# Patient Record
Sex: Male | Born: 1995 | Race: White | Hispanic: No | Marital: Single | Smoking: Never smoker
Health system: Southern US, Community
[De-identification: ages and names within clinical notes are randomized; demographics above are authoritative.]

## PROBLEM LIST (undated history)

## (undated) DIAGNOSIS — I10 Essential (primary) hypertension: Secondary | ICD-10-CM

## (undated) DIAGNOSIS — K219 Gastro-esophageal reflux disease without esophagitis: Secondary | ICD-10-CM

## (undated) DIAGNOSIS — J45909 Unspecified asthma, uncomplicated: Secondary | ICD-10-CM

## (undated) DIAGNOSIS — F329 Major depressive disorder, single episode, unspecified: Secondary | ICD-10-CM

## (undated) DIAGNOSIS — L709 Acne, unspecified: Secondary | ICD-10-CM

## (undated) DIAGNOSIS — R519 Headache, unspecified: Secondary | ICD-10-CM

## (undated) DIAGNOSIS — F419 Anxiety disorder, unspecified: Secondary | ICD-10-CM

## (undated) DIAGNOSIS — F32A Depression, unspecified: Secondary | ICD-10-CM

## (undated) DIAGNOSIS — R51 Headache: Secondary | ICD-10-CM

## (undated) DIAGNOSIS — K760 Fatty (change of) liver, not elsewhere classified: Secondary | ICD-10-CM

## (undated) HISTORY — DX: Headache, unspecified: R51.9

## (undated) HISTORY — DX: Headache: R51

## (undated) HISTORY — DX: Major depressive disorder, single episode, unspecified: F32.9

## (undated) HISTORY — DX: Gastro-esophageal reflux disease without esophagitis: K21.9

## (undated) HISTORY — DX: Acne, unspecified: L70.9

## (undated) HISTORY — PX: TONSILLECTOMY: SUR1361

## (undated) HISTORY — DX: Essential (primary) hypertension: I10

## (undated) HISTORY — DX: Anxiety disorder, unspecified: F41.9

## (undated) HISTORY — DX: Unspecified asthma, uncomplicated: J45.909

## (undated) HISTORY — PX: ADENOIDECTOMY: SUR15

## (undated) HISTORY — DX: Fatty (change of) liver, not elsewhere classified: K76.0

## (undated) HISTORY — DX: Depression, unspecified: F32.A

---
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
## 2015-01-02 ENCOUNTER — Ambulatory Visit: Payer: Self-pay | Admitting: Internal Medicine

## 2015-01-12 ENCOUNTER — Ambulatory Visit (INDEPENDENT_AMBULATORY_CARE_PROVIDER_SITE_OTHER): Payer: BLUE CROSS/BLUE SHIELD | Admitting: Internal Medicine

## 2015-01-12 ENCOUNTER — Ambulatory Visit: Payer: BLUE CROSS/BLUE SHIELD | Admitting: Cardiovascular Disease

## 2015-01-12 ENCOUNTER — Encounter: Payer: Self-pay | Admitting: Internal Medicine

## 2015-01-12 VITALS — BP 114/77 | HR 85 | Temp 98.0°F | Ht 73.0 in | Wt 383.2 lb

## 2015-01-12 DIAGNOSIS — R0789 Other chest pain: Secondary | ICD-10-CM | POA: Diagnosis not present

## 2015-01-12 DIAGNOSIS — F4323 Adjustment disorder with mixed anxiety and depressed mood: Secondary | ICD-10-CM | POA: Diagnosis not present

## 2015-01-12 LAB — COMPREHENSIVE METABOLIC PANEL
ALT: 53 U/L (ref 0–53)
AST: 31 U/L (ref 0–37)
Albumin: 4.3 g/dL (ref 3.5–5.2)
Alkaline Phosphatase: 61 U/L (ref 52–171)
BUN: 16 mg/dL (ref 6–23)
CO2: 25 mEq/L (ref 19–32)
CREATININE: 0.85 mg/dL (ref 0.40–1.50)
Calcium: 9.6 mg/dL (ref 8.4–10.5)
Chloride: 104 mEq/L (ref 96–112)
GFR: 123.75 mL/min (ref >60.00)
GLUCOSE: 90 mg/dL (ref 70–99)
POTASSIUM: 4.2 meq/L (ref 3.5–5.1)
SODIUM: 136 meq/L (ref 135–145)
Total Bilirubin: 0.5 mg/dL (ref 0.3–1.2)
Total Protein: 6.8 g/dL (ref 6.0–8.3)

## 2015-01-12 LAB — CBC
HEMATOCRIT: 46.4 % (ref 36.0–49.0)
HEMOGLOBIN: 16 g/dL (ref 12.0–16.0)
MCHC: 34.4 g/dL (ref 31.0–37.0)
MCV: 88.9 fl (ref 78.0–98.0)
PLATELETS: 264 10*3/uL (ref 150.0–575.0)
RBC: 5.22 Mil/uL (ref 3.80–5.70)
RDW: 13.7 % (ref 11.4–15.5)
WBC: 6.8 10*3/uL (ref 4.5–13.5)

## 2015-01-12 LAB — TROPONIN I: TNIDX: 0 ug/l (ref 0.00–0.06)

## 2015-01-12 LAB — TSH: TSH: 2.24 u[IU]/mL (ref 0.40–5.00)

## 2015-01-12 MED ORDER — BUPROPION HCL ER (XL) 150 MG PO TB24: 150.0000 mg | ORAL_TABLET | Freq: Every day | ORAL | Status: DC

## 2015-01-12 NOTE — Assessment & Plan Note (Signed)
Left sided chest pain in setting of anxiety. Suspicion for CAD is low, but he does have some risk factors including HTN, obesity. EKG today showed no acute changes. Will check troponin. Cardiology evaluation for possible stress test.

## 2015-01-12 NOTE — Progress Notes (Signed)
Subjective:    Patient ID: Larry Rogers, male    DOB: 1996/04/10, 19 y.o.   MRN: [id number]  HPI  19YO male presents for follow up.  Headaches - frontal over the last week. Thought due to sinus congestion.  Yesterday, driving and left hand started tingling, then he felt pressure in left chest. Notes some increased belching during that time. Episode lasted about 1 hour. Felt anxious during that time.  Sweaty at that time. Some nausea. BP was 171/100 when checked. HR 115. No recurrent symptoms. No recent stressors at home or work. Mother notes more anxiety lately. Quick to get angry.  Past medical, surgical, family and social history per today's encounter.  Review of Systems  Constitutional: Negative for fever, chills, activity change, appetite change, fatigue and unexpected weight change.  Eyes: Negative for visual disturbance.  Respiratory: Negative for cough and shortness of breath.   Cardiovascular: Positive for chest pain. Negative for palpitations and leg swelling.  Gastrointestinal: Negative for nausea, vomiting, abdominal pain, diarrhea, constipation and abdominal distention.  Genitourinary: Negative for dysuria, urgency and difficulty urinating.  Musculoskeletal: Negative for arthralgias and gait problem.  Skin: Negative for color change and rash.  Neurological: Positive for numbness and headaches. Negative for dizziness, tremors, seizures, speech difficulty, weakness and light-headedness.  Hematological: Negative for adenopathy.  Psychiatric/Behavioral: Negative for suicidal ideas, sleep disturbance and dysphoric mood. The patient is nervous/anxious.        Objective:    BP 114/77 mmHg  Pulse 85  Temp(Src) 98 F (36.7 C) (Oral)  Ht  (1.854 m)  Wt 383 lb 4 oz (173.841 kg)  BMI 50.57 kg/m2  SpO2 98% Physical Exam  Constitutional: He is oriented to person, place, and time. He appears well-developed and well-nourished. No distress.  HENT:  Head:  Normocephalic and atraumatic.  Right Ear: External ear normal.  Left Ear: External ear normal.  Nose: Nose normal.  Mouth/Throat: Oropharynx is clear and moist. No oropharyngeal exudate.  Eyes: Conjunctivae and EOM are normal. Pupils are equal, round, and reactive to light. Right eye exhibits no discharge. Left eye exhibits no discharge. No scleral icterus.  Neck: Normal range of motion. Neck supple. No tracheal deviation present. No thyromegaly present.  Cardiovascular: Normal rate, regular rhythm and normal heart sounds.  Exam reveals no gallop and no friction rub.   No murmur heard. Pulmonary/Chest: Effort normal and breath sounds normal. No accessory muscle usage. No tachypnea. No respiratory distress. He has no decreased breath sounds. He has no wheezes. He has no rhonchi. He has no rales. He exhibits no tenderness.  Musculoskeletal: Normal range of motion. He exhibits no edema.  Lymphadenopathy:    He has no cervical adenopathy.  Neurological: He is alert and oriented to person, place, and time. No cranial nerve deficit. Coordination normal.  Skin: Skin is warm and dry. No rash noted. He is not diaphoretic. No erythema. No pallor.  Psychiatric: He has a normal mood and affect. His behavior is normal. Judgment and thought content normal.          Assessment & Plan:   Problem List Items Addressed This Visit      Unprioritized   Adjustment disorder with mixed anxiety and depressed mood    Worsening anxiety. Will add Wellbutrin. Will set up counseling with Dr. Laymond Purser. Follow up in 4 weeks and prn.      Relevant Medications   buPROPion (WELLBUTRIN XL) 150 MG 24 hr tablet   Other Relevant Orders  Ambulatory referral to Psychology   Chest discomfort - Primary    Left sided chest pain in setting of anxiety. Suspicion for CAD is low, but he does have some risk factors including HTN, obesity. EKG today showed no acute changes. Will check troponin. Cardiology evaluation for possible  stress test.      Relevant Orders   EKG 12-Lead (Completed)   Ambulatory referral to Cardiology   Comprehensive metabolic panel   TSH   CBC   Troponin I       Return in about 2 weeks (around 01/26/2015) for Recheck.

## 2015-01-12 NOTE — Progress Notes (Signed)
Pre visit review using our clinic review tool, if applicable. No additional management support is needed unless otherwise documented below in the visit note. 

## 2015-01-12 NOTE — Assessment & Plan Note (Signed)
Worsening anxiety. Will add Wellbutrin. Will set up counseling with Dr. Laymond PurserPerrin. Follow up in 4 weeks and prn.

## 2015-01-12 NOTE — Patient Instructions (Signed)
Start Wellbutrin 150mg  daily.  We will set up cardiology evaluation and counseling.

## 2015-01-14 ENCOUNTER — Emergency Department
Admit: 2015-01-14 | Disposition: A | Discharge status: Home / Self Care | Payer: Self-pay | Admitting: Emergency Medicine

## 2015-01-14 ENCOUNTER — Other Ambulatory Visit: Payer: Self-pay

## 2015-01-14 LAB — BASIC METABOLIC PANEL
Anion Gap: 7 (ref 7–16)
BUN: 15 mg/dL
CHLORIDE: 107 mmol/L
Calcium, Total: 9 mg/dL
Co2: 24 mmol/L
Creatinine: 0.96 mg/dL
EGFR (African American): >60
EGFR (Non-African Amer.): >60
Glucose: 92 mg/dL
Potassium: 4.2 mmol/L
Sodium: 138 mmol/L

## 2015-01-14 LAB — CBC
HCT: 47.2 % (ref 40.0–52.0)
HGB: 16.4 g/dL (ref 13.0–18.0)
MCH: 30.9 pg (ref 26.0–34.0)
MCHC: 34.8 g/dL (ref 32.0–36.0)
MCV: 89 fL (ref 80–100)
PLATELETS: 271 10*3/uL (ref 150–440)
RBC: 5.3 10*6/uL (ref 4.40–5.90)
RDW: 13.7 % (ref 11.5–14.5)
WBC: 8.6 10*3/uL (ref 3.8–10.6)

## 2015-01-14 LAB — TROPONIN I

## 2015-01-17 ENCOUNTER — Ambulatory Visit (INDEPENDENT_AMBULATORY_CARE_PROVIDER_SITE_OTHER): Payer: BLUE CROSS/BLUE SHIELD | Admitting: Nurse Practitioner

## 2015-01-17 ENCOUNTER — Encounter: Payer: Self-pay | Admitting: Nurse Practitioner

## 2015-01-17 VITALS — BP 128/60 | HR 94 | Temp 98.1°F | Resp 12 | Ht 73.0 in | Wt 375.0 lb

## 2015-01-17 DIAGNOSIS — H539 Unspecified visual disturbance: Secondary | ICD-10-CM | POA: Diagnosis not present

## 2015-01-17 DIAGNOSIS — R519 Headache, unspecified: Secondary | ICD-10-CM | POA: Insufficient documentation

## 2015-01-17 DIAGNOSIS — G44229 Chronic tension-type headache, not intractable: Secondary | ICD-10-CM

## 2015-01-17 DIAGNOSIS — R1314 Dysphagia, pharyngoesophageal phase: Secondary | ICD-10-CM

## 2015-01-17 DIAGNOSIS — K219 Gastro-esophageal reflux disease without esophagitis: Secondary | ICD-10-CM | POA: Diagnosis not present

## 2015-01-17 DIAGNOSIS — R51 Headache: Secondary | ICD-10-CM

## 2015-01-17 DIAGNOSIS — G4700 Insomnia, unspecified: Secondary | ICD-10-CM | POA: Diagnosis not present

## 2015-01-17 MED ORDER — ONDANSETRON HCL 4 MG PO TABS: 4.0000 mg | ORAL_TABLET | Freq: Three times a day (TID) | ORAL | Status: DC | PRN

## 2015-01-17 NOTE — Assessment & Plan Note (Signed)
Asked pt to try trazodone once again. Will follow.

## 2015-01-17 NOTE — Progress Notes (Signed)
Subjective:    Patient ID: Larry Rogers, male    DOB: Mar 07, 1996, 19 y.o.   MRN: [id number]  HPI  [name] is a 19 yo male with a CC of headache x 3 weeks.   1) Headaches upon waking x 3 feels better when lying down  Denies head trauma, no sports, MVA, pressure when bending forward  Constant throughout the day, starts in front and goes to the back, described as nagging, hard time focusing, feels vision has changed, (Last eye exam was last year and was normal) pulsating occasionally, no headaches like this before Tylenol sinus, allergy medication, tylenol PM, "tension medicine"- nothing helpful    2) Anxiety-He is fatigued recently, not sleeping, not using the trazodone Maximum anxiety recently, unsure of trigger or what has changed, denies school/work/home stressors. Seeing Larry Rogers soon  3) Nausea x 1.5 week  Vomited last night- felt nauseated after dinner, only happened once, no unusual foods, felt nauseated before meal  Diarrhea this morning- loose stools, woke him up this morning   No more episodes this morning  Pressure, gas, trouble swallowing, seeing Larry Rogers tomorrow   2 months this has been going on  Saw Larry Rogers last week, EKG set up with cardio  4/30 chest symptoms again [facility]   Review of Systems  Constitutional: Positive for fatigue. Negative for fever, chills and diaphoresis.  HENT: Positive for trouble swallowing. Negative for tinnitus.   Eyes: Negative for visual disturbance.       No blurry or double vision  Respiratory: Positive for chest tightness and shortness of breath. Negative for wheezing.   Cardiovascular: Positive for palpitations. Negative for chest pain and leg swelling.  Gastrointestinal: Positive for nausea and vomiting. Negative for abdominal pain and diarrhea.       Loose stools, increased gas  Genitourinary: Negative for difficulty urinating.  Musculoskeletal: Negative for back pain and neck pain.  Skin: Negative  for rash.  Neurological: Positive for light-headedness and headaches. Negative for syncope and weakness.  Hematological: Does not bruise/bleed easily.  Psychiatric/Behavioral: Positive for sleep disturbance and decreased concentration. Negative for suicidal ideas, confusion and agitation. The patient is nervous/anxious. The patient is not hyperactive.    Past Medical History  Diagnosis Date  . Acne     [facility]  . Hypertension 2012    Dr. Kathrene Rogers  . Asthma     seasonal allergies, Dx age 89    History   Social History  . Marital Status: Single    Spouse Name: N/A  . Number of Children: N/A  . Years of Education: N/A   Occupational History  . Not on file.   Social History Main Topics  . Smoking status: Never Smoker   . Smokeless tobacco: Never Used  . Alcohol Use: No  . Drug Use: No  . Sexual Activity: Not on file   Other Topics Concern  . Not on file   Social History Narrative   [place] Pets - dog and cat.      School - [employer], rising senior. Plans to major in accountant.      Diet - regular, tries to limit processed sugars      Exercise - started basketball, swimming    Past Surgical History  Procedure Laterality Date  . Tonsillectomy      Family History  Problem Relation Age of Onset  . Hypertension Father   . Heart disease Paternal Grandfather   .  Hypertension Paternal Grandfather     No Known Allergies  Current Outpatient Prescriptions on File Prior to Visit  Medication Sig Dispense Refill  . buPROPion (WELLBUTRIN XL) 150 MG 24 hr tablet Take 1 tablet (150 mg total) by mouth daily. 30 tablet 3  . Cetirizine HCl (ZYRTEC ALLERGY) 10 MG CAPS Take 1 capsule (10 mg total) by mouth daily. 90 capsule 4  . lisinopril (PRINIVIL,ZESTRIL) 10 MG tablet Take 1 tablet (10 mg total) by mouth daily. 90 tablet 4  . montelukast (SINGULAIR) 10 MG tablet Take 1 tablet (10 mg total) by mouth at bedtime. 90 tablet 4  . pantoprazole  (PROTONIX) 40 MG tablet Take 1 tablet (40 mg total) by mouth 2 (two) times daily. 90 tablet 3  . traZODone (DESYREL) 50 MG tablet Take 1 tablet (50 mg total) by mouth at bedtime as needed for sleep. 30 tablet 3   No current facility-administered medications on file prior to visit.      Objective:   Physical Exam  Constitutional: He is oriented to person, place, and time. He appears well-developed and well-nourished. No distress.  BP 128/60 mmHg  Pulse 94  Temp(Src) 98.1 F (36.7 C) (Oral)  Resp 12  Ht  (1.854 m)  Wt 375 lb (170.099 kg)  BMI 49.49 kg/m2  SpO2 97%   HENT:  Head: Normocephalic and atraumatic.  Right Ear: External ear normal.  Left Ear: External ear normal.  Eyes: EOM are normal. Right eye exhibits no discharge. Left eye exhibits no discharge. No scleral icterus.  Cardiovascular: Normal rate, regular rhythm and normal heart sounds.  Exam reveals no gallop and no friction rub.   No murmur heard. Not orthostatic today Nurse put in extended vitals  Pulmonary/Chest: Effort normal and breath sounds normal. No respiratory distress. He has no wheezes. He has no rales. He exhibits no tenderness.  Neurological: He is alert and oriented to person, place, and time. He has normal reflexes. He displays normal reflexes. No cranial nerve deficit. He exhibits normal muscle tone. Coordination normal.  Cranial nerves 2,3,4,5,7, 11, and 12 intact. No accomodation reflex noted eyes move down and to the left together. DTR's hard to assess because pt was hyper-aware and would not relax. 5/5 tone of upper and lower extremities, sensation equal in upper and lower extremities and in different dermatomes. Romberg negative  Skin: Skin is warm and dry. No rash noted. He is not diaphoretic.  Psychiatric: He has a normal mood and affect. His behavior is normal. Judgment and thought content normal.  Vitals reviewed.     Assessment & Plan:

## 2015-01-17 NOTE — Assessment & Plan Note (Signed)
No accommodation reflex, pt moves eyes down and lateral to the left and eyes track together. Unsure if this has been a problem in the past due to meeting the patient for the first time today and he has no memory of someone commenting on this or diagnosing him. Will refer to Dr. Everlena CooperJaffe. FU prn worsening/failure to improve.

## 2015-01-17 NOTE — Assessment & Plan Note (Signed)
Pt seeing Dr. Arlyce DiceKaplan tomorrow

## 2015-01-17 NOTE — Assessment & Plan Note (Signed)
Recent headache now lasting 3 weeks. Probably due to stress/tension, could be sinus related because of worsening upon bending forward and sitting up from lying. Asked pt to try excedrin migraine when first getting up and stop if not helpful or jittery. Watch NSAIDs due to esophogeal problems, but okay to try once.

## 2015-01-17 NOTE — Progress Notes (Signed)
Pre visit review using our clinic review tool, if applicable. No additional management support is needed unless otherwise documented below in the visit note. 

## 2015-01-17 NOTE — Assessment & Plan Note (Signed)
Has appointment tomorrow.

## 2015-01-17 NOTE — Patient Instructions (Signed)
We will contact you about your referral to neurology.

## 2015-01-18 ENCOUNTER — Ambulatory Visit (INDEPENDENT_AMBULATORY_CARE_PROVIDER_SITE_OTHER): Payer: BLUE CROSS/BLUE SHIELD | Admitting: Gastroenterology

## 2015-01-18 ENCOUNTER — Encounter: Payer: Self-pay | Admitting: Gastroenterology

## 2015-01-18 VITALS — BP 112/60 | HR 76 | Ht 73.0 in | Wt 374.1 lb

## 2015-01-18 DIAGNOSIS — R1314 Dysphagia, pharyngoesophageal phase: Secondary | ICD-10-CM

## 2015-01-18 DIAGNOSIS — E669 Obesity, unspecified: Secondary | ICD-10-CM | POA: Diagnosis not present

## 2015-01-18 DIAGNOSIS — K219 Gastro-esophageal reflux disease without esophagitis: Secondary | ICD-10-CM | POA: Diagnosis not present

## 2015-01-18 MED ORDER — DEXLANSOPRAZOLE 60 MG PO CPDR: 60.0000 mg | DELAYED_RELEASE_CAPSULE | Freq: Every day | ORAL | Status: DC

## 2015-01-18 NOTE — Progress Notes (Signed)
_                                                                                                                History of Present Illness:  [name] is a 19 year old white male referred at the request of [name] for evaluation of reflux.  Over the past 2 months he's complaining of severe pyrosis despite taking pantoprazole twice a day.  This may occur anytime throughout the day or night.  It is often postprandial.  He has excess belching and gas.  He denies abdominal pain.  When he tries to belch he may regurgitate food at which point he develops chest discomfort.  He has mild dysphagia to solids.   Past Medical History  Diagnosis Date  . Acne     [facility]  . Hypertension 2012    Dr. Kathrene AluArchinald  . Asthma     seasonal allergies, Dx age 89  . Anxiety   . Depression    Past Surgical History  Procedure Laterality Date  . Tonsillectomy     family history includes Aortic aneurysm in his paternal grandfather; Diabetes in his maternal uncle and mother; Heart disease in his paternal grandfather; Hypertension in his father and paternal grandfather. There is no history of Colon cancer, Colon polyps, Esophageal cancer, Kidney disease, or Gallbladder disease. Current Outpatient Prescriptions  Medication Sig Dispense Refill  . buPROPion (WELLBUTRIN XL) 150 MG 24 hr tablet Take 1 tablet (150 mg total) by mouth daily. 30 tablet 3  . Cetirizine HCl (ZYRTEC ALLERGY) 10 MG CAPS Take 1 capsule (10 mg total) by mouth daily. 90 capsule 4  . lisinopril (PRINIVIL,ZESTRIL) 10 MG tablet Take 1 tablet (10 mg total) by mouth daily. 90 tablet 4  . montelukast (SINGULAIR) 10 MG tablet Take 1 tablet (10 mg total) by mouth at bedtime. 90 tablet 4  . ondansetron (ZOFRAN) 4 MG tablet Take 1 tablet (4 mg total) by mouth every 8 (eight) hours as needed for nausea or vomiting. 20 tablet 0  . pantoprazole (PROTONIX) 40 MG tablet Take 1 tablet (40 mg total) by mouth 2 (two) times daily. 90 tablet 3   . traZODone (DESYREL) 50 MG tablet Take 1 tablet (50 mg total) by mouth at bedtime as needed for sleep. 30 tablet 3   No current facility-administered medications for this visit.   Allergies as of 01/18/2015  . (No Known Allergies)    reports that he has never smoked. He has never used smokeless tobacco. He reports that he does not drink alcohol or use illicit drugs.   Review of Systems: Pertinent positive and negative review of systems were noted in the above HPI section. All other review of systems were otherwise negative.  Vital signs were reviewed in today's medical record Physical Exam: General: Obese male in no acute distress Skin: anicteric Head: Normocephalic and atraumatic Eyes:  sclerae anicteric, EOMI Ears: Normal auditory acuity Mouth: No deformity or lesions Neck: Supple, no masses or thyromegaly Lymph Nodes: no lymphadenopathy Lungs:  Clear throughout to auscultation Heart: Regular rate and rhythm; no murmurs, rubs or bruits Gastroinestinal: Soft, non tender and non distended. No masses, hepatosplenomegaly or hernias noted. Normal Bowel sounds.  There is no succussion splash Rectal:deferred Musculoskeletal: Symmetrical with no gross deformities  Skin: No lesions on visible extremities Pulses:  Normal pulses noted Extremities: No clubbing, cyanosis, edema or deformities noted Neurological: Alert oriented x 4, grossly nonfocal Cervical Nodes:  No significant cervical adenopathy Inguinal Nodes: No significant inguinal adenopathy Psychological:  Alert and cooperative. Normal mood and affect  See Assessment and Plan under Problem List

## 2015-01-18 NOTE — Assessment & Plan Note (Signed)
Rule out early esophageal stricture and eosinophilic esophagitis.    Recommendations #1 upper endoscopy

## 2015-01-18 NOTE — Addendum Note (Signed)
Addended by: Marlowe KaysSTALLINGS,  M on: 01/18/2015 11:54 AM   Modules accepted: Orders

## 2015-01-18 NOTE — Patient Instructions (Signed)
Gastroesophageal Reflux Disease, Adult Gastroesophageal reflux disease (GERD) happens when acid from your stomach flows up into the esophagus. When acid comes in contact with the esophagus, the acid causes soreness (inflammation) in the esophagus. Over time, GERD may create small holes (ulcers) in the lining of the esophagus. CAUSES   Increased body weight. This puts pressure on the stomach, making acid rise from the stomach into the esophagus.  Smoking. This increases acid production in the stomach.  Drinking alcohol. This causes decreased pressure in the lower esophageal sphincter (valve or ring of muscle between the esophagus and stomach), allowing acid from the stomach into the esophagus.  Late evening meals and a full stomach. This increases pressure and acid production in the stomach.  A malformed lower esophageal sphincter. Sometimes, no cause is found. SYMPTOMS   Burning pain in the lower part of the mid-chest behind the breastbone and in the mid-stomach area. This may occur twice a week or more often.  Trouble swallowing.  Sore throat.  Dry cough.  Asthma-like symptoms including chest tightness, shortness of breath, or wheezing. DIAGNOSIS  Your caregiver may be able to diagnose GERD based on your symptoms. In some cases, X-rays and other tests may be done to check for complications or to check the condition of your stomach and esophagus. TREATMENT  Your caregiver may recommend over-the-counter or prescription medicines to help decrease acid production. Ask your caregiver before starting or adding any new medicines.  HOME CARE INSTRUCTIONS   Change the factors that you can control. Ask your caregiver for guidance concerning weight loss, quitting smoking, and alcohol consumption.  Avoid foods and drinks that make your symptoms worse, such as:  Caffeine or alcoholic drinks.  Chocolate.  Peppermint or mint flavorings.  Garlic and onions.  Spicy foods.  Citrus fruits,  such as oranges, lemons, or limes.  Tomato-based foods such as sauce, chili, salsa, and pizza.  Fried and fatty foods.  Avoid lying down for the 3 hours prior to your bedtime or prior to taking a nap.  Eat small, frequent meals instead of large meals.  Wear loose-fitting clothing. Do not wear anything tight around your waist that causes pressure on your stomach.  Raise the head of your bed 6 to 8 inches with wood blocks to help you sleep. Extra pillows will not help.  Only take over-the-counter or prescription medicines for pain, discomfort, or fever as directed by your caregiver.  Do not take aspirin, ibuprofen, or other nonsteroidal anti-inflammatory drugs (NSAIDs). SEEK IMMEDIATE MEDICAL CARE IF:   You have pain in your arms, neck, jaw, teeth, or back.  Your pain increases or changes in intensity or duration.  You develop nausea, vomiting, or sweating (diaphoresis).  You develop shortness of breath, or you faint.  Your vomit is green, yellow, black, or looks like coffee grounds or blood.  Your stool is red, bloody, or black. These symptoms could be signs of other problems, such as heart disease, gastric bleeding, or esophageal bleeding. MAKE SURE YOU:   Understand these instructions.  Will watch your condition.  Will get help right away if you are not doing well or get worse. Document Released: 06/12/2005 Document Revised: 11/25/2011 Document Reviewed: 03/22/2011 [facility] Patient Information 2015 [place], [place]. This information is not intended to replace advice given to you by your health care provider. Make sure you discuss any questions you have with your health care provider.   Food Choices for Gastroesophageal Reflux Disease When you have gastroesophageal reflux disease (GERD), the  foods you eat and your eating habits are very important. Choosing the right foods can help ease the discomfort of GERD. WHAT GENERAL GUIDELINES DO I NEED TO FOLLOW?  Choose fruits,  vegetables, whole grains, low-fat dairy products, and low-fat meat, fish, and poultry.  Limit fats such as oils, salad dressings, butter, nuts, and avocado.  Keep a food diary to identify foods that cause symptoms.  Avoid foods that cause reflux. These may be different for different people.  Eat frequent small meals instead of three large meals each day.  Eat your meals slowly, in a relaxed setting.  Limit fried foods.  Cook foods using methods other than frying.  Avoid drinking alcohol.  Avoid drinking large amounts of liquids with your meals.  Avoid bending over or lying down until 2-3 hours after eating. WHAT FOODS ARE NOT RECOMMENDED? The following are some foods and drinks that may worsen your symptoms: Vegetables Tomatoes. Tomato juice. Tomato and spaghetti sauce. Chili peppers. Onion and garlic. Horseradish. Fruits Oranges, grapefruit, and lemon (fruit and juice). Meats High-fat meats, fish, and poultry. This includes hot dogs, ribs, ham, sausage, salami, and bacon. Dairy Whole milk and chocolate milk. Sour cream. Cream. Butter. Ice cream. Cream cheese.  Beverages Coffee and tea, with or without caffeine. Carbonated beverages or energy drinks. Condiments Hot sauce. Barbecue sauce.  Sweets/Desserts Chocolate and cocoa. Donuts. Peppermint and spearmint. Fats and Oils High-fat foods, including [place] fries and potato chips. Other Vinegar. Strong spices, such as black pepper, white pepper, red pepper, cayenne, curry powder, cloves, ginger, and chili powder. The items listed above may not be a complete list of foods and beverages to avoid. Contact your dietitian for more information. Document Released: 09/02/2005 Document Revised: 09/07/2013 Document Reviewed: 07/07/2013 [facility] Patient Information 2015 [place]. This information is not intended to replace advice given to you by your health care provider. Make sure you discuss any questions you have with your  health care provider.    Your hospital procedure has been scheduled on 03/16/2015 at 10am Separate instructions have been given We are sending in Dexilant to your pharmacy

## 2015-01-18 NOTE — Assessment & Plan Note (Signed)
I had lengthy discussion with he and his father about his obesity including health issues related to obesity.  I also explained that is probably contributing to his reflux.  We discussed weight loss as a strategy and consideration of bariatric surgery.   25 minutes were spent with the patient and/or family.  Greater than 50% was spent in counseling and coordination of care with the patient

## 2015-01-18 NOTE — Assessment & Plan Note (Signed)
Patient is symptomatic despite twice a day Protonix.  Recommendations #1 antireflux measures #2 trial of dexilant 60 mg daily  Cc Dr. Dan HumphreysWalker

## 2015-01-19 ENCOUNTER — Telehealth: Payer: Self-pay | Admitting: Gastroenterology

## 2015-01-19 MED ORDER — ESOMEPRAZOLE MAGNESIUM 40 MG PO CPDR: 40.0000 mg | DELAYED_RELEASE_CAPSULE | Freq: Two times a day (BID) | ORAL | Status: DC

## 2015-01-19 NOTE — Telephone Encounter (Signed)
FYI Dr Arlyce DiceKaplan, I sent in Nexium 40 BID for patient. His insurance will not cover dexilant without trying 2 preferred meds.    So I sent in Nexium

## 2015-01-19 NOTE — Telephone Encounter (Signed)
ok 

## 2015-01-24 ENCOUNTER — Encounter: Payer: Self-pay | Admitting: Internal Medicine

## 2015-01-25 ENCOUNTER — Ambulatory Visit (INDEPENDENT_AMBULATORY_CARE_PROVIDER_SITE_OTHER): Payer: BLUE CROSS/BLUE SHIELD | Admitting: Family Medicine

## 2015-01-25 ENCOUNTER — Telehealth: Payer: Self-pay | Admitting: Gastroenterology

## 2015-01-25 ENCOUNTER — Encounter: Payer: Self-pay | Admitting: Family Medicine

## 2015-01-25 ENCOUNTER — Encounter: Payer: Self-pay | Admitting: Internal Medicine

## 2015-01-25 VITALS — BP 138/90 | HR 95 | Temp 98.8°F | Ht 73.0 in | Wt 369.8 lb

## 2015-01-25 DIAGNOSIS — R1013 Epigastric pain: Secondary | ICD-10-CM | POA: Diagnosis not present

## 2015-01-25 MED ORDER — SUCRALFATE 1 GM/10ML PO SUSP: 1.0000 g | Freq: Three times a day (TID) | ORAL | Status: DC

## 2015-01-25 NOTE — Progress Notes (Signed)
Subjective:    Patient ID: Larry Rogers, male    DOB: 06/26/1996, 19 y.o.   MRN: [id number]  HPI Here with GI complaints  Some pain on L side of abdomen next to umbilicus (a little above)  Describes as an ache  Eating does not affect it  Does use tums-no help with that   Never had an abd us   Has hx of GERD  Vomited yellow material this am  Does not feel like this was a virus  Some loose stool  No appetite  Lost 14 lb  Did not eat last night   No nsaids    Was recently changed from protonix to nexium bid (no difference)  Is set up for EGD June 30th  gerd makes his chest hurt and then anxiety kicks in     Chemistry      Component Value Date/Time   NA 138 01/14/2015 2047   NA 136 01/12/2015 0837   NA 139 06/21/2014 0940   K 4.2 01/14/2015 2047   K 4.2 01/12/2015 0837   CL 107 01/14/2015 2047   CL 104 01/12/2015 0837   CO2 24 01/14/2015 2047   CO2 25 01/12/2015 0837   BUN 15 01/14/2015 2047   BUN 16 01/12/2015 0837   BUN 12 06/21/2014 0940   CREATININE 0.96 01/14/2015 2047   CREATININE 0.85 01/12/2015 0837   CREATININE 0.7 06/21/2014 0940   GLU 80 06/21/2014 0940      Component Value Date/Time   CALCIUM 9.0 01/14/2015 2047   CALCIUM 9.6 01/12/2015 0837   ALKPHOS 61 01/12/2015 0837   AST 31 01/12/2015 0837   ALT 53 01/12/2015 0837   BILITOT 0.5 01/12/2015 0837      Lab Results  Component Value Date   WBC 8.6 01/14/2015   HGB 16.4 01/14/2015   HCT 47.2 01/14/2015   MCV 89 01/14/2015   PLT 271 01/14/2015   No results found for: AMYLASE No results found for: LIPASE    No alcohol   Patient Active Problem List   Diagnosis Date Noted  . Problem of visual accommodation 01/17/2015  . Headache 01/17/2015  . Chest discomfort 01/12/2015  . Dysphagia, pharyngoesophageal phase 12/20/2014  . Seasonal allergies 12/20/2014  . Insomnia 08/30/2014  . Adjustment disorder with mixed anxiety and depressed mood 08/15/2014  . Asthma, chronic 08/15/2014  .  Gastroesophageal reflux disease without esophagitis 08/15/2014  . Essential hypertension, benign 02/25/2013  . Acne 02/25/2013  . Chronic obstructive asthma, unspecified 02/25/2013  . Obesity 02/25/2013   Past Medical History  Diagnosis Date  . Acne     [facility]  . Hypertension 2012    Dr. Kathrene AluArchinald  . Asthma     seasonal allergies, Dx age 89  . Anxiety   . Depression    Past Surgical History  Procedure Laterality Date  . Tonsillectomy     History  Substance Use Topics  . Smoking status: Never Smoker   . Smokeless tobacco: Never Used  . Alcohol Use: No   Family History  Problem Relation Age of Onset  . Hypertension Father   . Heart disease Paternal Grandfather   . Hypertension Paternal Grandfather   . Aortic aneurysm Paternal Grandfather   . Colon cancer Neg Hx   . Colon polyps Neg Hx   . Esophageal cancer Neg Hx   . Diabetes Mother   . Diabetes Maternal Uncle     x2  . Kidney disease Neg Hx   .  Gallbladder disease Neg Hx    No Known Allergies Current Outpatient Prescriptions on File Prior to Visit  Medication Sig Dispense Refill  . buPROPion (WELLBUTRIN XL) 150 MG 24 hr tablet Take 1 tablet (150 mg total) by mouth daily. 30 tablet 3  . Cetirizine HCl (ZYRTEC ALLERGY) 10 MG CAPS Take 1 capsule (10 mg total) by mouth daily. 90 capsule 4  . esomeprazole (NEXIUM) 40 MG capsule Take 1 capsule (40 mg total) by mouth 2 (two) times daily before a meal. 60 capsule 2  . lisinopril (PRINIVIL,ZESTRIL) 10 MG tablet Take 1 tablet (10 mg total) by mouth daily. 90 tablet 4  . montelukast (SINGULAIR) 10 MG tablet Take 1 tablet (10 mg total) by mouth at bedtime. 90 tablet 4  . ondansetron (ZOFRAN) 4 MG tablet Take 1 tablet (4 mg total) by mouth every 8 (eight) hours as needed for nausea or vomiting. 20 tablet 0  . traZODone (DESYREL) 50 MG tablet Take 1 tablet (50 mg total) by mouth at bedtime as needed for sleep. 30 tablet 3   No current facility-administered medications on  file prior to visit.     Review of Systems Review of Systems  Constitutional: Negative for fever, appetite change, fatigue and unexpected weight change.  Eyes: Negative for pain and visual disturbance.  Respiratory: Negative for cough and shortness of breath.   Cardiovascular: Negative for cp or palpitations    Gastrointestinal: Negative for , diarrhea and constipation. pos for upper abd pain and n/v  Genitourinary: Negative for urgency and frequency.  Skin: Negative for pallor or rash   Neurological: Negative for weakness, light-headedness, numbness and headaches.  Hematological: Negative for adenopathy. Does not bruise/bleed easily.  Psychiatric/Behavioral: Negative for dysphoric mood. The patient is not nervous/anxious.         Objective:   Physical Exam  Constitutional: He appears well-developed and well-nourished. No distress.  obese and well appearing   HENT:  Head: Normocephalic and atraumatic.  Eyes: Conjunctivae and EOM are normal. Pupils are equal, round, and reactive to light. No scleral icterus.  Neck: Normal range of motion. Neck supple.  Cardiovascular: Normal rate, regular rhythm and normal heart sounds.   Pulmonary/Chest: Effort normal and breath sounds normal.  Abdominal: Soft. Bowel sounds are normal. He exhibits no distension. There is no hepatosplenomegaly. There is tenderness in the epigastric area and left upper quadrant. There is no rigidity, no rebound, no guarding, no CVA tenderness, no tenderness at McBurney's point and negative Murphy's sign.  Musculoskeletal: He exhibits no edema.  Lymphadenopathy:    He has no cervical adenopathy.  Neurological: He is alert.  Skin: No rash noted. No erythema. No pallor.  Psychiatric: He has a normal mood and affect.          Assessment & Plan:   Problem List Items Addressed This Visit    Abdominal pain, epigastric - Primary    Ongoing and not improving with PPI increase  Will add carafate and ask him to  update us with how it does  rec f/u with GI and PCP and proceed with EGD   Disc diet and what to watch inst if symptoms become severe to go to ER

## 2015-01-25 NOTE — Patient Instructions (Signed)
I think you may have gastritis Continue nexium  Get endoscopy as planned Try carafate four times daily (not within an hour of other medicines) - as directed  Bland diet  Fluids Is not improving or worse  - let Dr Dan HumphreysWalker know

## 2015-01-25 NOTE — Progress Notes (Signed)
Pre visit review using our clinic review tool, if applicable. No additional management support is needed unless otherwise documented below in the visit note. 

## 2015-01-26 NOTE — Assessment & Plan Note (Signed)
Ongoing and not improving with PPI increase  Will add carafate and ask him to update us with how it does  rec f/u with GI and PCP and proceed with EGD   Disc diet and what to watch inst if symptoms become severe to go to ER

## 2015-01-31 ENCOUNTER — Ambulatory Visit (INDEPENDENT_AMBULATORY_CARE_PROVIDER_SITE_OTHER): Payer: BLUE CROSS/BLUE SHIELD

## 2015-01-31 ENCOUNTER — Ambulatory Visit (INDEPENDENT_AMBULATORY_CARE_PROVIDER_SITE_OTHER): Payer: BLUE CROSS/BLUE SHIELD | Admitting: Cardiovascular Disease

## 2015-01-31 ENCOUNTER — Encounter: Payer: Self-pay | Admitting: Cardiovascular Disease

## 2015-01-31 VITALS — BP 130/84 | HR 100 | Ht 74.0 in | Wt 376.5 lb

## 2015-01-31 DIAGNOSIS — E669 Obesity, unspecified: Secondary | ICD-10-CM

## 2015-01-31 DIAGNOSIS — F4323 Adjustment disorder with mixed anxiety and depressed mood: Secondary | ICD-10-CM

## 2015-01-31 DIAGNOSIS — I10 Essential (primary) hypertension: Secondary | ICD-10-CM

## 2015-01-31 DIAGNOSIS — R0789 Other chest pain: Secondary | ICD-10-CM

## 2015-01-31 DIAGNOSIS — R079 Chest pain, unspecified: Secondary | ICD-10-CM

## 2015-01-31 DIAGNOSIS — K219 Gastro-esophageal reflux disease without esophagitis: Secondary | ICD-10-CM

## 2015-01-31 NOTE — Assessment & Plan Note (Signed)
We have tried to reassure him that everything looks good. Treadmill study ordered again reassure him that he can restart his exercise program. Unable to exclude panic attacks

## 2015-01-31 NOTE — Assessment & Plan Note (Signed)
Blood pressure is well controlled on today's visit. No changes made to the medications. 

## 2015-01-31 NOTE — Telephone Encounter (Signed)
Spoke with the mother Larry Rogers. She reports there has been improvement in the patient's symptoms. They will have him continue the Nexium for now, but will call if he acutely worsens or fails to improve.

## 2015-01-31 NOTE — Assessment & Plan Note (Signed)
Atypical chest pain symptoms, presenting at rest. He reports that he is nervous about his symptoms and would like to make sure everything is okay before proceeding with his exercise regiment, playing basketball. We will day routine treadmill study today. If this is normal with no significant EKG abnormality, we've recommended he proceed with his exercise program. Unable to exclude upper GI gas or other pathology as contributing to his chest discomfort. symptoms relieved with belching after drinking soda.

## 2015-01-31 NOTE — Patient Instructions (Signed)
You are doing well. No medication changes were made.  We will order a treadmill stress test for chest  pain  Please call us if you have new issues that need to be addressed before your next appt.

## 2015-01-31 NOTE — Assessment & Plan Note (Signed)
He is scheduled to see GI for EGD end June 2016

## 2015-01-31 NOTE — Assessment & Plan Note (Signed)
We have encouraged continued exercise, careful diet management in an effort to lose weight. 

## 2015-01-31 NOTE — Progress Notes (Signed)
Patient ID: Larry Rogers, male    DOB: Nov 08, 1995, 19 y.o.   MRN: [id number]  HPI Comments: Larry Rogers is a pleasant 19 year old gentleman with obesity, severe GERD, belching, with 3 weeks of hand tingling, chest pain who presents for evaluation in the [place].  He reports that 3 weeks ago he developed symptoms while driving home from school. He had hand tingling that then developed into chest pain in the left upper area and lower pectoral area. He was able to continue driving home, symptoms lasted 1-2 hours. He's never had symptoms like this before and wonders if it could be from a panic attack.  Since then he has had there is a shins of symptoms like this on and off. Symptoms typically present at rest, not with exertion. Prior to this he was playing basketball on a regular basis with no symptoms. Since then he has not been as active, played basketball once, had shortness of breath which he feels may have been normal that resolved with rest.  He is scheduled to see GI for workup given severe belching and GERD symptoms. Sometimes when he drinks soda, some of his chest pain is relieved with belching  EKG on today's visit shows normal sinus rhythm with rate 100 bpm, no significant ST or T-wave changes  No Known Allergies  Current Outpatient Prescriptions on File Prior to Visit  Medication Sig Dispense Refill  . buPROPion (WELLBUTRIN XL) 150 MG 24 hr tablet Take 1 tablet (150 mg total) by mouth daily. 30 tablet 3  . Cetirizine HCl (ZYRTEC ALLERGY) 10 MG CAPS Take 1 capsule (10 mg total) by mouth daily. 90 capsule 4  . esomeprazole (NEXIUM) 40 MG capsule Take 1 capsule (40 mg total) by mouth 2 (two) times daily before a meal. 60 capsule 2  . lisinopril (PRINIVIL,ZESTRIL) 10 MG tablet Take 1 tablet (10 mg total) by mouth daily. 90 tablet 4  . montelukast (SINGULAIR) 10 MG tablet Take 1 tablet (10 mg total) by mouth at bedtime. 90 tablet 4  . ondansetron (ZOFRAN) 4 MG tablet Take 1  tablet (4 mg total) by mouth every 8 (eight) hours as needed for nausea or vomiting. 20 tablet 0  . sucralfate (CARAFATE) 1 GM/10ML suspension Take 10 mLs (1 g total) by mouth 4 (four) times daily -  with meals and at bedtime. 420 mL 1  . traZODone (DESYREL) 50 MG tablet Take 1 tablet (50 mg total) by mouth at bedtime as needed for sleep. 30 tablet 3   No current facility-administered medications on file prior to visit.    Past Medical History  Diagnosis Date  . Acne     [facility]  . Hypertension 2012    Dr. Kathrene AluArchinald  . Asthma     seasonal allergies, Dx age 63  . Anxiety   . Depression   . GERD (gastroesophageal reflux disease)     Past Surgical History  Procedure Laterality Date  . Tonsillectomy      Social History  reports that he has never smoked. He has never used smokeless tobacco. He reports that he does not drink alcohol or use illicit drugs.  Family History family history includes Aortic aneurysm in his paternal grandfather; Diabetes in his maternal uncle and mother; Heart disease in his paternal grandfather; Hypertension in his father and paternal grandfather. There is no history of Colon cancer, Colon polyps, Esophageal cancer, Kidney disease, or Gallbladder disease.   Review of Systems  Constitutional: Negative.   HENT:  Negative.   Eyes: Negative.   Respiratory: Positive for shortness of breath.   Cardiovascular: Positive for chest pain.  Gastrointestinal: Negative.        Belching  Endocrine: Negative.   Musculoskeletal: Negative.   Skin: Negative.   Allergic/Immunologic: Negative.   Neurological: Negative.   Hematological: Negative.   Psychiatric/Behavioral: Negative.   All other systems reviewed and are negative.   BP 130/84 mmHg  Pulse 100  Ht 6\' 2"  (1.88 m)  Wt 376 lb 8 oz (170.779 kg)  BMI 48.32 kg/m2  Physical Exam  Constitutional: He is oriented to person, place, and time. He appears well-developed and well-nourished.  Obese  HENT:   Head: Normocephalic.  Nose: Nose normal.  Mouth/Throat: Oropharynx is clear and moist.  Eyes: Conjunctivae are normal. Pupils are equal, round, and reactive to light.  Neck: Normal range of motion. Neck supple. No JVD present.  Cardiovascular: Normal rate, regular rhythm, normal heart sounds and intact distal pulses.  Exam reveals no gallop and no friction rub.   No murmur heard. Pulmonary/Chest: Effort normal and breath sounds normal. No respiratory distress. He has no wheezes. He has no rales. He exhibits no tenderness.  Abdominal: Soft. Bowel sounds are normal. He exhibits no distension. There is no tenderness.  Musculoskeletal: Normal range of motion. He exhibits no edema or tenderness.  Lymphadenopathy:    He has no cervical adenopathy.  Neurological: He is alert and oriented to person, place, and time. Coordination normal.  Skin: Skin is warm and dry. No rash noted. No erythema.  Psychiatric: He has a normal mood and affect. His behavior is normal. Judgment and thought content normal.

## 2015-02-01 LAB — EXERCISE TOLERANCE TEST
CHL CUP RESTING HR STRESS: 127 {beats}/min
CHL CUP STRESS STAGE 1 SPEED: 0 mph
CHL CUP STRESS STAGE 2 HR: 146 {beats}/min
CHL CUP STRESS STAGE 3 SPEED: 1.7 mph
CHL CUP STRESS STAGE 4 SBP: 214 mmHg
CHL CUP STRESS STAGE 4 SPEED: 2.5 mph
CHL CUP STRESS STAGE 5 HR: 166 {beats}/min
CHL CUP STRESS STAGE 6 GRADE: 0 %
CHL CUP STRESS STAGE 6 SPEED: 0 mph
CSEPEW: 7 METS
Exercise duration (min): 5 min
Exercise duration (sec): 59 s
Peak BP: 214 mmHg
Peak HR: 184 {beats}/min
Percent of predicted max HR: 91 %
Stage 1 DBP: 93 mmHg
Stage 1 Grade: 0 %
Stage 1 HR: 146 {beats}/min
Stage 1 SBP: 122 mmHg
Stage 2 Grade: 0 %
Stage 2 Speed: 0 mph
Stage 3 Grade: 10 %
Stage 3 HR: 153 {beats}/min
Stage 4 DBP: 79 mmHg
Stage 4 Grade: 12 %
Stage 4 HR: 184 {beats}/min
Stage 5 Grade: 0 %
Stage 5 Speed: 0 mph
Stage 6 DBP: 84 mmHg
Stage 6 HR: 125 {beats}/min
Stage 6 SBP: 140 mmHg

## 2015-02-03 ENCOUNTER — Encounter: Payer: Self-pay | Admitting: Internal Medicine

## 2015-02-03 ENCOUNTER — Ambulatory Visit (INDEPENDENT_AMBULATORY_CARE_PROVIDER_SITE_OTHER): Payer: BLUE CROSS/BLUE SHIELD | Admitting: Internal Medicine

## 2015-02-03 VITALS — BP 137/69 | HR 100 | Temp 98.7°F | Ht 73.0 in | Wt 375.5 lb

## 2015-02-03 DIAGNOSIS — K219 Gastro-esophageal reflux disease without esophagitis: Secondary | ICD-10-CM

## 2015-02-03 DIAGNOSIS — F4323 Adjustment disorder with mixed anxiety and depressed mood: Secondary | ICD-10-CM

## 2015-02-03 DIAGNOSIS — R0789 Other chest pain: Secondary | ICD-10-CM | POA: Diagnosis not present

## 2015-02-03 MED ORDER — CIPROFLOXACIN HCL 500 MG PO TABS: 500.0000 mg | ORAL_TABLET | Freq: Two times a day (BID) | ORAL | Status: DC

## 2015-02-03 NOTE — Assessment & Plan Note (Signed)
Symptoms have improved with Nexium bid. Will continue. Also continue Sucralfate as needed for worsening symptoms. EGD pending for 02/2015.

## 2015-02-03 NOTE — Progress Notes (Signed)
Subjective:    Patient ID: Larry Rogers, male    DOB: 09/09/1996, 19 y.o.   MRN: [id number]  HPI  19YO male presents for follow up.  Chest pain - Had exercise stress test that was normal. Chest pain has improved with Nexium bid. Also started on Sucralfate, but has not had to use this. Denies current pain. Scheduled to have EGD 02/2015.  Anxiety - Improved with Wellbutrin. Continues to feel anxious at times when he is alone, but feels things are much better. Work situation is better.  Wt Readings from Last 3 Encounters:  02/03/15 375 lb 8 oz (170.326 kg) (100 %*, Z = 3.65)  01/31/15 376 lb 8 oz (170.779 kg) (100 %*, Z = 3.66)  01/25/15 369 lb 12 oz (167.717 kg) (100 %*, Z = 3.61)   * Growth percentiles are based on CDC 2-20 Years data.     Past medical, surgical, family and social history per today's encounter.  Review of Systems  Constitutional: Negative for fever, chills, activity change, appetite change, fatigue and unexpected weight change.  Eyes: Negative for visual disturbance.  Respiratory: Negative for cough and shortness of breath.   Cardiovascular: Negative for chest pain, palpitations and leg swelling.  Gastrointestinal: Negative for nausea, vomiting, abdominal pain, diarrhea, constipation and abdominal distention.  Genitourinary: Negative for dysuria, urgency and difficulty urinating.  Musculoskeletal: Negative for arthralgias and gait problem.  Skin: Negative for color change and rash.  Hematological: Negative for adenopathy.  Psychiatric/Behavioral: Negative for sleep disturbance and dysphoric mood. The patient is not nervous/anxious.        Objective:    BP 137/69 mmHg  Pulse 100  Temp(Src) 98.7 F (37.1 C) (Oral)  Ht 6\' 1"  (1.854 m)  Wt 375 lb 8 oz (170.326 kg)  BMI 49.55 kg/m2  SpO2 96% Physical Exam  Constitutional: He is oriented to person, place, and time. He appears well-developed and well-nourished. No distress.  HENT:  Head: Normocephalic  and atraumatic.  Right Ear: External ear normal.  Left Ear: External ear normal.  Nose: Nose normal.  Mouth/Throat: Oropharynx is clear and moist. No oropharyngeal exudate.  Eyes: Conjunctivae and EOM are normal. Pupils are equal, round, and reactive to light. Right eye exhibits no discharge. Left eye exhibits no discharge. No scleral icterus.  Neck: Normal range of motion. Neck supple. No tracheal deviation present. No thyromegaly present.  Cardiovascular: Normal rate, regular rhythm and normal heart sounds.  Exam reveals no gallop and no friction rub.   No murmur heard. Pulmonary/Chest: Effort normal and breath sounds normal. No accessory muscle usage. No tachypnea. No respiratory distress. He has no decreased breath sounds. He has no wheezes. He has no rhonchi. He has no rales. He exhibits no tenderness.  Musculoskeletal: Normal range of motion. He exhibits no edema.  Lymphadenopathy:    He has no cervical adenopathy.  Neurological: He is alert and oriented to person, place, and time. No cranial nerve deficit. Coordination normal.  Skin: Skin is warm and dry. No rash noted. He is not diaphoretic. No erythema. No pallor.  Psychiatric: His behavior is normal. Judgment and thought content normal. His mood appears anxious. He does not exhibit a depressed mood. He expresses no suicidal ideation.          Assessment & Plan:   Problem List Items Addressed This Visit      Unprioritized   Adjustment disorder with mixed anxiety and depressed mood    Anxiety improved with use of Bupropion. Will  continue. Plan for follow up in 3 months to assess. Psychology referral in process.      Chest discomfort - Primary    Reviewed cardiology and GI notes. Stress test was normal. Symptoms have improved with Nexium bid. Await EGD results. Follow up in 3 months and prn.      Gastroesophageal reflux disease without esophagitis    Symptoms have improved with Nexium bid. Will continue. Also continue  Sucralfate as needed for worsening symptoms. EGD pending for 02/2015.          Return in about 3 months (around 05/06/2015) for Recheck.

## 2015-02-03 NOTE — Assessment & Plan Note (Signed)
Anxiety improved with use of Bupropion. Will continue. Plan for follow up in 3 months to assess. Psychology referral in process.

## 2015-02-03 NOTE — Patient Instructions (Signed)
Follow up in 3 months or sooner as needed. 

## 2015-02-03 NOTE — Assessment & Plan Note (Signed)
Reviewed cardiology and GI notes. Stress test was normal. Symptoms have improved with Nexium bid. Await EGD results. Follow up in 3 months and prn.

## 2015-02-03 NOTE — Progress Notes (Signed)
Pre visit review using our clinic review tool, if applicable. No additional management support is needed unless otherwise documented below in the visit note. 

## 2015-02-10 ENCOUNTER — Encounter: Payer: Self-pay | Admitting: Internal Medicine

## 2015-02-12 ENCOUNTER — Emergency Department: Payer: BLUE CROSS/BLUE SHIELD

## 2015-02-12 DIAGNOSIS — R200 Anesthesia of skin: Secondary | ICD-10-CM | POA: Diagnosis present

## 2015-02-12 DIAGNOSIS — R51 Headache: Secondary | ICD-10-CM | POA: Diagnosis not present

## 2015-02-12 DIAGNOSIS — I10 Essential (primary) hypertension: Secondary | ICD-10-CM | POA: Insufficient documentation

## 2015-02-12 DIAGNOSIS — Z79899 Other long term (current) drug therapy: Secondary | ICD-10-CM | POA: Diagnosis not present

## 2015-02-12 DIAGNOSIS — F419 Anxiety disorder, unspecified: Secondary | ICD-10-CM | POA: Diagnosis not present

## 2015-02-12 NOTE — ED Notes (Signed)
Pt presents to ER alert and in NAD. Pt states he has frequent h/a and has for "as long as I can remember." Pt ambulates with steady gait, moving all extremities normally. Pt states he wakes up and has numbness "all over" or one side at time.

## 2015-02-13 ENCOUNTER — Encounter: Payer: Self-pay | Admitting: Emergency Medicine

## 2015-02-13 ENCOUNTER — Emergency Department
Admission: EM | Admit: 2015-02-13 | Discharge: 2015-02-13 | Disposition: A | Discharge status: Home / Self Care | Payer: BLUE CROSS/BLUE SHIELD | Attending: Emergency Medicine | Admitting: Emergency Medicine

## 2015-02-13 DIAGNOSIS — R51 Headache: Secondary | ICD-10-CM

## 2015-02-13 DIAGNOSIS — R519 Headache, unspecified: Secondary | ICD-10-CM

## 2015-02-13 DIAGNOSIS — F418 Other specified anxiety disorders: Secondary | ICD-10-CM

## 2015-02-13 MED ORDER — DIAZEPAM 2 MG PO TABS: 2.0000 mg | ORAL_TABLET | Freq: Three times a day (TID) | ORAL | Status: DC | PRN

## 2015-02-13 MED ORDER — DIAZEPAM 2 MG PO TABS
ORAL_TABLET | ORAL | Status: AC
  Administered 2015-02-13: 2 mg via ORAL
  Filled 2015-02-13: qty 1

## 2015-02-13 MED ORDER — DIAZEPAM 2 MG PO TABS
2.0000 mg | ORAL_TABLET | Freq: Once | ORAL | Status: AC
  Administered 2015-02-13: 2 mg via ORAL

## 2015-02-13 NOTE — ED Notes (Signed)
Pt states has a history of anxiety. Pt states has had a headache for a month, "just in different parts of my head". Pt states "i've been getting numb in my arm and the back of my head when i get these headaches." pt states arm numbness is left sided. Pt denies nausea, vomiting, diarrhea. Pt's family states "he starts googling things, everything wrong with him and his anxiety just gets worse."

## 2015-02-13 NOTE — Discharge Instructions (Signed)
1. Take Valium as needed for anxiety (#15). 2. Return to the ER for worsening symptoms, persistent vomiting, difficulty breathing or other concerns.  General Headache Without Cause A headache is pain or discomfort felt around the head or neck area. The specific cause of a headache may not be found. There are many causes and types of headaches. A few common ones are:  Tension headaches.  Migraine headaches.  Cluster headaches.  Chronic daily headaches. HOME CARE INSTRUCTIONS   Keep all follow-up appointments with your caregiver or any specialist referral.  Only take over-the-counter or prescription medicines for pain or discomfort as directed by your caregiver.  Lie down in a dark, quiet room when you have a headache.  Keep a headache journal to find out what may trigger your migraine headaches. For example, write down:  What you eat and drink.  How much sleep you get.  Any change to your diet or medicines.  Try massage or other relaxation techniques.  Put ice packs or heat on the head and neck. Use these 3 to 4 times per day for 15 to 20 minutes each time, or as needed.  Limit stress.  Sit up straight, and do not tense your muscles.  Quit smoking if you smoke.  Limit alcohol use.  Decrease the amount of caffeine you drink, or stop drinking caffeine.  Eat and sleep on a regular schedule.  Get 7 to 9 hours of sleep, or as recommended by your caregiver.  Keep lights dim if bright lights bother you and make your headaches worse. SEEK MEDICAL CARE IF:   You have problems with the medicines you were prescribed.  Your medicines are not working.  You have a change from the usual headache.  You have nausea or vomiting. SEEK IMMEDIATE MEDICAL CARE IF:   Your headache becomes severe.  You have a fever.  You have a stiff neck.  You have loss of vision.  You have muscular weakness or loss of muscle control.  You start losing your balance or have trouble  walking.  You feel faint or pass out.  You have severe symptoms that are different from your first symptoms. MAKE SURE YOU:   Understand these instructions.  Will watch your condition.  Will get help right away if you are not doing well or get worse. Document Released: 09/02/2005 Document Revised: 11/25/2011 Document Reviewed: 09/18/2011 [facility] Patient Information 2015 [employer] [place]. This information is not intended to replace advice given to you by your health care provider. Make sure you discuss any questions you have with your health care provider.

## 2015-02-13 NOTE — ED Provider Notes (Signed)
[facility] Emergency Department Provider Note  ____________________________________________  Time seen: Approximately 2:17 AM  I have reviewed the triage vital signs and the nursing notes.   HISTORY  Chief Complaint Numbness Headache   HPI Larry Rogers is a 19 y.o. male who presents with a two-month history of headache associated with numbness. Patient states the numbness has been escalating his anxiety. Father states when patient gets extremely anxious he is difficult to calm. Symptoms began approximately the same time as breakup with his girlfriend. Patient describes migrating headaches not associated with vision changes, nausea or vomiting. Describes transient migratory numbness. Patient states this morning he awoke with headache and left-sided numbness. Patient googled his symptoms and is extremely concerned he has a brain tumor. Parents state last week they all went on a cruise and patient was so concerned about his medical symptoms that he was unable to enjoy the cruise. Patient rates his headache a 0/10 after I told him his CT results. Currently denies pain, chest pain, shortness of breath, abdominal pain, vomiting, diarrhea, numbness or tingling.   Past Medical History  Diagnosis Date  . Acne     [place] [facility]  . Hypertension 2012    Dr. Kathrene Alu  . Asthma     seasonal allergies, Dx age 51  . Anxiety   . Depression   . GERD (gastroesophageal reflux disease)   . Anxiety     Patient Active Problem List   Diagnosis Date Noted  . Abdominal pain, epigastric 01/25/2015  . Problem of visual accommodation 01/17/2015  . Headache 01/17/2015  . Chest discomfort 01/12/2015  . Dysphagia, pharyngoesophageal phase 12/20/2014  . Seasonal allergies 12/20/2014  . Insomnia 08/30/2014  . Adjustment disorder with mixed anxiety and depressed mood 08/15/2014  . Asthma, chronic 08/15/2014  . Gastroesophageal reflux disease without esophagitis 08/15/2014  .  Essential hypertension, benign 02/25/2013  . Acne 02/25/2013  . Chronic obstructive asthma, unspecified 02/25/2013  . Obesity 02/25/2013    Past Surgical History  Procedure Laterality Date  . Tonsillectomy      Current Outpatient Rx  Name  Route  Sig  Dispense  Refill  . buPROPion (WELLBUTRIN XL) 150 MG 24 hr tablet   Oral   Take 1 tablet (150 mg total) by mouth daily.   30 tablet   3   . Cetirizine HCl (ZYRTEC ALLERGY) 10 MG CAPS   Oral   Take 1 capsule (10 mg total) by mouth daily.   90 capsule   4   . esomeprazole (NEXIUM) 40 MG capsule   Oral   Take 1 capsule (40 mg total) by mouth 2 (two) times daily before a meal.   60 capsule   2   . lisinopril (PRINIVIL,ZESTRIL) 10 MG tablet   Oral   Take 1 tablet (10 mg total) by mouth daily.   90 tablet   4   . ondansetron (ZOFRAN) 4 MG tablet   Oral   Take 1 tablet (4 mg total) by mouth every 8 (eight) hours as needed for nausea or vomiting.   20 tablet   0   . ciprofloxacin (CIPRO) 500 MG tablet   Oral   Take 1 tablet (500 mg total) by mouth 2 (two) times daily.   14 tablet   0   . diazepam (VALIUM) 2 MG tablet   Oral   Take 1 tablet (2 mg total) by mouth every 8 (eight) hours as needed for muscle spasms.   15 tablet  0   . montelukast (SINGULAIR) 10 MG tablet   Oral   Take 1 tablet (10 mg total) by mouth at bedtime.   90 tablet   4   . sucralfate (CARAFATE) 1 GM/10ML suspension   Oral   Take 10 mLs (1 g total) by mouth 4 (four) times daily -  with meals and at bedtime.   420 mL   1   . traZODone (DESYREL) 50 MG tablet   Oral   Take 1 tablet (50 mg total) by mouth at bedtime as needed for sleep.   30 tablet   3     Allergies Review of patient's allergies indicates no known allergies.  Family History  Problem Relation Age of Onset  . Hypertension Father   . Heart disease Paternal Grandfather   . Hypertension Paternal Grandfather   . Aortic aneurysm Paternal Grandfather   . Colon cancer  Neg Hx   . Colon polyps Neg Hx   . Esophageal cancer Neg Hx   . Diabetes Mother   . Diabetes Maternal Uncle     x2  . Kidney disease Neg Hx   . Gallbladder disease Neg Hx   Father has migraines Mother has migraines Paternal great-grandmother with cerebral aneurysm No family history of multiple sclerosis  Social History History  Substance Use Topics  . Smoking status: Never Smoker   . Smokeless tobacco: Never Used  . Alcohol Use: No    Review of Systems Constitutional: No fever/chills Eyes: No visual changes. ENT: No sore throat. Cardiovascular: Denies chest pain. Respiratory: Denies shortness of breath. Gastrointestinal: No abdominal pain.  No nausea, no vomiting.  No diarrhea.  No constipation. Genitourinary: Negative for dysuria. Musculoskeletal: Negative for back pain. Skin: Negative for rash. Neurological: Positive for headaches and numbness. Psychiatric:Positive for anxiety.  10-point ROS otherwise negative.  ____________________________________________   PHYSICAL EXAM:  VITAL SIGNS: ED Triage Vitals  Enc Vitals Group     BP 02/12/15 2106 163/91 mmHg     Pulse Rate 02/12/15 2106 102     Resp 02/12/15 2106 20     Temp 02/12/15 2106 98.2 F (36.8 C)     Temp src --      SpO2 02/12/15 2106 98 %     Weight 02/12/15 2106 372 lb 14.4 oz (169.146 kg)     Height --      Head Cir --      Peak Flow --      Pain Score 02/12/15 2107 6     Pain Loc --      Pain Edu? --      Excl. in GC? --     Constitutional: Alert and oriented. Well appearing; slightly anxious. Eyes: Conjunctivae are normal. PERRL. EOMI. Fundoscopy within normal limits. Head: Atraumatic. Nose: No congestion/rhinnorhea. Mouth/Throat: Mucous membranes are moist.  Oropharynx non-erythematous. Neck: No stridor. Supple neck without signs of meningismus. Cardiovascular: Normal rate, regular rhythm. Grossly normal heart sounds.  Good peripheral circulation. Respiratory: Normal respiratory  effort.  No retractions. Lungs CTAB. Gastrointestinal: Soft and nontender. No distention. No abdominal bruits. No CVA tenderness. Musculoskeletal: No lower extremity tenderness nor edema.  No joint effusions. Neurologic:  Normal speech and language. No gross focal neurologic deficits are appreciated. Speech is normal. No gait instability. Skin:  Skin is warm, dry and intact. No rash noted. Psychiatric: Mood and affect are slightly anxious. Speech and behavior are normal.  ____________________________________________   LABS (all labs ordered are listed, but only abnormal results are  displayed)  Labs Reviewed - No data to display ____________________________________________  EKG  None ____________________________________________  RADIOLOGY  CT head without contrast interpreted by Dr. Alfredo BattyMattern: Negative CT of the head. ____________________________________________   PROCEDURES  Procedure(s) performed: None  Critical Care performed: No  ____________________________________________   INITIAL IMPRESSION / ASSESSMENT AND PLAN / ED COURSE  Pertinent labs & imaging results that were available during my care of the patient were reviewed by me and considered in my medical decision making (see chart for details).  19 year old male who presents with a two-month history of headaches associated with numbness without focal neurological deficits. After speaking with patient and his parents, it seems that anxiety is a major component to patient's symptoms. Patient agrees to try low-dose Valium when necessary. Patient already has a neurologist appointment this month. Will refer patient to [facility] for follow-up. Strict return precautions given. All verbalize understanding and agree with plan of care. ____________________________________________   FINAL CLINICAL IMPRESSION(S) / ED DIAGNOSES  Final diagnoses:  Acute nonintractable headache, unspecified headache type  Anxiety about health       Irean HongJade J , MD 02/13/15 [id number]

## 2015-02-14 ENCOUNTER — Ambulatory Visit (INDEPENDENT_AMBULATORY_CARE_PROVIDER_SITE_OTHER): Payer: BLUE CROSS/BLUE SHIELD | Admitting: Internal Medicine

## 2015-02-14 ENCOUNTER — Encounter: Payer: Self-pay | Admitting: Internal Medicine

## 2015-02-14 VITALS — BP 115/73 | HR 96 | Temp 99.0°F | Ht 73.0 in | Wt 373.0 lb

## 2015-02-14 DIAGNOSIS — G44229 Chronic tension-type headache, not intractable: Secondary | ICD-10-CM | POA: Diagnosis not present

## 2015-02-14 DIAGNOSIS — F411 Generalized anxiety disorder: Secondary | ICD-10-CM | POA: Diagnosis not present

## 2015-02-14 DIAGNOSIS — F418 Other specified anxiety disorders: Secondary | ICD-10-CM | POA: Insufficient documentation

## 2015-02-14 MED ORDER — SERTRALINE HCL 50 MG PO TABS: 50.0000 mg | ORAL_TABLET | Freq: Every day | ORAL | Status: DC

## 2015-02-14 MED ORDER — CLONAZEPAM 0.5 MG PO TABS: 0.5000 mg | ORAL_TABLET | Freq: Three times a day (TID) | ORAL | Status: DC | PRN

## 2015-02-14 NOTE — Patient Instructions (Addendum)
Start Sertraline 50mg  daily at bedtime. Stop Wellbutrin.  Use Clonazepam 0.5mg  up to three times daily as needed for anxiety.   Generalized Anxiety Disorder Generalized anxiety disorder (GAD) is a mental disorder. It interferes with life functions, including relationships, work, and school. GAD is different from normal anxiety, which everyone experiences at some point in their lives in response to specific life events and activities. Normal anxiety actually helps us prepare for and get through these life events and activities. Normal anxiety goes away after the event or activity is over.  GAD causes anxiety that is not necessarily related to specific events or activities. It also causes excess anxiety in proportion to specific events or activities. The anxiety associated with GAD is also difficult to control. GAD can vary from mild to severe. People with severe GAD can have intense waves of anxiety with physical symptoms (panic attacks).  SYMPTOMS The anxiety and worry associated with GAD are difficult to control. This anxiety and worry are related to many life events and activities and also occur more days than not for 6 months or longer. People with GAD also have three or more of the following symptoms (one or more in children):  Restlessness.   Fatigue.  Difficulty concentrating.   Irritability.  Muscle tension.  Difficulty sleeping or unsatisfying sleep. DIAGNOSIS GAD is diagnosed through an assessment by your health care provider. Your health care provider will ask you questions aboutyour mood,physical symptoms, and events in your life. Your health care provider may ask you about your medical history and use of alcohol or drugs, including prescription medicines. Your health care provider may also do a physical exam and blood tests. Certain medical conditions and the use of certain substances can cause symptoms similar to those associated with GAD. Your health care provider may refer  you to a mental health specialist for further evaluation. TREATMENT The following therapies are usually used to treat GAD:   Medication. Antidepressant medication usually is prescribed for long-term daily control. Antianxiety medicines may be added in severe cases, especially when panic attacks occur.   Talk therapy (psychotherapy). Certain types of talk therapy can be helpful in treating GAD by providing support, education, and guidance. A form of talk therapy called cognitive behavioral therapy can teach you healthy ways to think about and react to daily life events and activities.  Stress managementtechniques. These include yoga, meditation, and exercise and can be very helpful when they are practiced regularly. A mental health specialist can help determine which treatment is best for you. Some people see improvement with one therapy. However, other people require a combination of therapies. Document Released: 12/28/2012 Document Revised: 01/17/2014 Document Reviewed: 12/28/2012 [facility] Patient Information 2015 [place]. This information is not intended to replace advice given to you by your health care provider. Make sure you discuss any questions you have with your health care provider.

## 2015-02-14 NOTE — Progress Notes (Signed)
Pre visit review using our clinic review tool, if applicable. No additional management support is needed unless otherwise documented below in the visit note. 

## 2015-02-14 NOTE — Assessment & Plan Note (Signed)
Recent headache likely related to anxiety. Headache has improved and CT head was normal. Will start Sertraline and stop Wellbutrin. Follow up in 4 weeks.

## 2015-02-14 NOTE — Progress Notes (Signed)
Subjective:    Patient ID: Larry Rogers, male    DOB: 11-13-1995, 19 y.o.   MRN: [id number]  HPI 19YO male presents for follow up.  Went on a cruise May 21st. During the safety drill on the cruise, he became anxious and felt like he heard a "pop" with onset of severe headache. Tried Tylenol, Excedrine, and Ibuprofen with no improvement. For the rest of the cruise, had headache with sharp pain bilateral temples. Worsened with movement. Felt numbness in face and arms. Went to the ED 5/30. CT head was normal. Headache improved in ED with Valium.  At present, has headache "pressure" rated 4/10. Feeling anxious. Having trouble sleeping.   Past medical, surgical, family and social history per today's encounter.  Review of Systems  Constitutional: Negative for fever, chills, activity change, appetite change, fatigue and unexpected weight change.  Eyes: Negative for visual disturbance.  Respiratory: Negative for cough and shortness of breath.   Cardiovascular: Negative for chest pain, palpitations and leg swelling.  Gastrointestinal: Negative for nausea, vomiting, abdominal pain, diarrhea, constipation and abdominal distention.  Genitourinary: Negative for dysuria, urgency and difficulty urinating.  Musculoskeletal: Negative for arthralgias and gait problem.  Skin: Negative for color change and rash.  Neurological: Positive for numbness and headaches. Negative for dizziness, tremors, syncope, facial asymmetry and weakness.  Hematological: Negative for adenopathy.  Psychiatric/Behavioral: Negative for sleep disturbance and dysphoric mood. The patient is not nervous/anxious.        Objective:    BP 115/73 mmHg  Pulse 96  Temp(Src) 99 F (37.2 C) (Oral)  Ht 6\' 1"  (1.854 m)  Wt 373 lb (169.192 kg)  BMI 49.22 kg/m2  SpO2 94% Physical Exam  Constitutional: He is oriented to person, place, and time. He appears well-developed and well-nourished. No distress.  HENT:  Head:  Normocephalic and atraumatic.  Right Ear: External ear normal.  Left Ear: External ear normal.  Nose: Nose normal.  Mouth/Throat: Oropharynx is clear and moist. No oropharyngeal exudate.  Eyes: Conjunctivae and EOM are normal. Pupils are equal, round, and reactive to light. Right eye exhibits no discharge. Left eye exhibits no discharge. No scleral icterus.  Neck: Normal range of motion. Neck supple. No tracheal deviation present. No thyromegaly present.  Cardiovascular: Normal rate, regular rhythm and normal heart sounds.  Exam reveals no gallop and no friction rub.   No murmur heard. Pulmonary/Chest: Effort normal and breath sounds normal. No accessory muscle usage. No tachypnea. No respiratory distress. He has no decreased breath sounds. He has no wheezes. He has no rhonchi. He has no rales. He exhibits no tenderness.  Musculoskeletal: Normal range of motion. He exhibits no edema.  Lymphadenopathy:    He has no cervical adenopathy.  Neurological: He is alert and oriented to person, place, and time. No cranial nerve deficit. Coordination normal.  Skin: Skin is warm and dry. No rash noted. He is not diaphoretic. No erythema. No pallor.  Psychiatric: His speech is normal and behavior is normal. Judgment and thought content normal. His mood appears anxious.          Assessment & Plan:   Problem List Items Addressed This Visit      Unprioritized   GAD (generalized anxiety disorder) - Primary    Symptoms c/w generalized anxiety disorder. Discussed with pt and his mother today. Encouraged him to follow through with counseling. Will stop Wellbutrin and start Sertraline 50mg  daily. Start Clonzazepam tid prn. Discussed potential risks of this medication. Follow up in  4 weeks and earlier by email.      Relevant Medications   sertraline (ZOLOFT) 50 MG tablet   clonazePAM (KLONOPIN) 0.5 MG tablet   Headache    Recent headache likely related to anxiety. Headache has improved and CT head was  normal. Will start Sertraline and stop Wellbutrin. Follow up in 4 weeks.      Relevant Medications   sertraline (ZOLOFT) 50 MG tablet   clonazePAM (KLONOPIN) 0.5 MG tablet       Return in about 4 weeks (around 03/14/2015) for Recheck.

## 2015-02-14 NOTE — Assessment & Plan Note (Signed)
Symptoms c/w generalized anxiety disorder. Discussed with pt and his mother today. Encouraged him to follow through with counseling. Will stop Wellbutrin and start Sertraline 50mg  daily. Start Clonzazepam tid prn. Discussed potential risks of this medication. Follow up in 4 weeks and earlier by email.

## 2015-02-20 ENCOUNTER — Ambulatory Visit: Payer: BLUE CROSS/BLUE SHIELD | Admitting: Internal Medicine

## 2015-03-06 ENCOUNTER — Encounter: Payer: Self-pay | Admitting: Internal Medicine

## 2015-03-09 ENCOUNTER — Encounter (HOSPITAL_COMMUNITY): Payer: Self-pay | Admitting: *Deleted

## 2015-03-11 ENCOUNTER — Encounter: Payer: Self-pay | Admitting: Internal Medicine

## 2015-03-13 ENCOUNTER — Ambulatory Visit (INDEPENDENT_AMBULATORY_CARE_PROVIDER_SITE_OTHER): Payer: BLUE CROSS/BLUE SHIELD | Admitting: Neurology

## 2015-03-13 ENCOUNTER — Encounter: Payer: Self-pay | Admitting: Neurology

## 2015-03-13 VITALS — BP 140/92 | HR 78 | Resp 22 | Ht 75.5 in | Wt 379.8 lb

## 2015-03-13 DIAGNOSIS — G4485 Primary stabbing headache: Secondary | ICD-10-CM

## 2015-03-13 DIAGNOSIS — I10 Essential (primary) hypertension: Secondary | ICD-10-CM

## 2015-03-13 DIAGNOSIS — R51 Headache: Secondary | ICD-10-CM | POA: Diagnosis not present

## 2015-03-13 DIAGNOSIS — H52529 Paresis of accommodation, unspecified eye: Secondary | ICD-10-CM

## 2015-03-13 DIAGNOSIS — H52522 Paresis of accommodation, left eye: Secondary | ICD-10-CM

## 2015-03-13 DIAGNOSIS — R519 Headache, unspecified: Secondary | ICD-10-CM

## 2015-03-13 MED ORDER — TOPIRAMATE 50 MG PO TABS: 50.0000 mg | ORAL_TABLET | Freq: Every day | ORAL | Status: DC

## 2015-03-13 NOTE — Progress Notes (Signed)
NEUROLOGY CONSULTATION NOTE  Larry Rogers MRN: [id number] DOB: [birth year]  Referring provider: Naomie Dean Primary care provider: Ronna Polio  Reason for consult:  New-onset daily headache  HISTORY OF PRESENT ILLNESS: Larry Rogers is an 19 year old right-handed male with hypertension and anxiety who presents for headache and evaluation of absent visual accommodation reflex.  Records and CT of head reviewed.  He is accompanied by his sister who provides some history  Onset:  3 months Location:  Right temporal/parietal region Quality:  Sharp/stabbing Intensity:  7/10 Aura:  no Prodrome:  no Associated symptoms:  Right eye tears and twitches.  Mild photophobia.  He denies double vision.  He has longstanding history of seeing floaters. Duration:  Off and on throughout the day Frequency:  Daily.  Goes to sleep and wakes up with it. Triggers/exacerbating factors:  none Relieving factors:  none Activity:  Able to function.  abortive medication:  Tylenol, Excedrin, Advil, Ibuprofen (ineffective)  In May, he had a physical exam performed at his PCP office and was noted to have absent visual accommodation reflex.  He saw an eye doctor in May who found no abnormalities but his extraocular eye movements were not checked.  He had a CT of the head without contrast performed on 02/12/15 for the headaches, as well as reported left-sided numbness.  It was negative.  PAST MEDICAL HISTORY: Past Medical History  Diagnosis Date  . Acne     [place] [facility]  . Hypertension 2012    Dr. Kathrene Alu  . Asthma     seasonal allergies, Dx age 4  . Anxiety   . GERD (gastroesophageal reflux disease)   . Anxiety   . Headache     PAST SURGICAL HISTORY: Past Surgical History  Procedure Laterality Date  . Tonsillectomy      MEDICATIONS: Current Outpatient Prescriptions on File Prior to Visit  Medication Sig Dispense Refill  . Cetirizine HCl (ZYRTEC ALLERGY) 10 MG CAPS Take 1 capsule (10  mg total) by mouth daily. 90 capsule 4  . clonazePAM (KLONOPIN) 0.5 MG tablet Take 1 tablet (0.5 mg total) by mouth 3 (three) times daily as needed for anxiety. 90 tablet 2  . Esomeprazole Magnesium (NEXIUM 24HR PO) Take 44.6 mg by mouth 2 (two) times daily.    [place] Kitchen lisinopril (PRINIVIL,ZESTRIL) 10 MG tablet Take 1 tablet (10 mg total) by mouth daily. 90 tablet 4  . montelukast (SINGULAIR) 10 MG tablet Take 1 tablet (10 mg total) by mouth at bedtime. 90 tablet 4  . ondansetron (ZOFRAN) 4 MG tablet Take 1 tablet (4 mg total) by mouth every 8 (eight) hours as needed for nausea or vomiting. 20 tablet 0  . sertraline (ZOLOFT) 50 MG tablet Take 1 tablet (50 mg total) by mouth at bedtime. 30 tablet 3  . sucralfate (CARAFATE) 1 GM/10ML suspension Take 10 mLs (1 g total) by mouth 4 (four) times daily -  with meals and at bedtime. 420 mL 1   No current facility-administered medications on file prior to visit.    ALLERGIES: No Known Allergies  FAMILY HISTORY: Family History  Problem Relation Age of Onset  . Hypertension Father   . Heart disease Paternal Grandfather   . Hypertension Paternal Grandfather   . Aortic aneurysm Paternal Grandfather   . Colon cancer Neg Hx   . Colon polyps Neg Hx   . Esophageal cancer Neg Hx   . Diabetes Mother   . Diabetes Maternal Uncle     x2  .  Kidney disease Neg Hx   . Gallbladder disease Neg Hx     SOCIAL HISTORY: History   Social History  . Marital Status: Single    Spouse Name: N/A  . Number of Children: 0  . Years of Education: N/A   Occupational History  . Student     [employer]   Social History Main Topics  . Smoking status: Never Smoker   . Smokeless tobacco: Never Used  . Alcohol Use: No  . Drug Use: No  . Sexual Activity: No   Other Topics Concern  . Not on file   Social History Narrative   [place]. Pets - dog and cat.      [employer] - [employer], rising senior. Plans to major in accountant.      Diet - regular, tries  to limit processed sugars      Exercise - started basketball, swimming    REVIEW OF SYSTEMS: Constitutional: No fevers, chills, or sweats, no generalized fatigue, change in appetite Eyes: No visual changes, double vision, eye pain Ear, nose and throat: No hearing loss, ear pain, nasal congestion, sore throat Cardiovascular: No chest pain, palpitations Respiratory:  No shortness of breath at rest or with exertion, wheezes GastrointestinaI: No nausea, vomiting, diarrhea, abdominal pain, fecal incontinence Genitourinary:  No dysuria, urinary retention or frequency Musculoskeletal:  No neck pain, back pain Integumentary: No rash, pruritus, skin lesions Neurological: as above Psychiatric: No depression, insomnia, anxiety Endocrine: No palpitations, fatigue, diaphoresis, mood swings, change in appetite, change in weight, increased thirst Hematologic/Lymphatic:  No anemia, purpura, petechiae. Allergic/Immunologic: no itchy/runny eyes, nasal congestion, recent allergic reactions, rashes  PHYSICAL EXAM: Filed Vitals:   03/13/15 1013  BP: 140/92  Pulse: 78  Resp: 22   General: No acute distress Head:  Normocephalic/atraumatic Eyes:  fundi unremarkable, without vessel changes, exudates, hemorrhages or papilledema. Neck: supple, no paraspinal tenderness, full range of motion Back: No paraspinal tenderness Heart: regular rate and rhythm Lungs: Clear to auscultation bilaterally. Vascular: No carotid bruits. Neurological Exam: Mental status: alert and oriented to person, place, and time, recent and remote memory intact, fund of knowledge intact, attention and concentration intact, speech fluent and not dysarthric, language intact. Cranial nerves: CN I: not tested CN II: pupils equal, round and reactive to light, visual fields intact, fundi unremarkable, without vessel changes, exudates, hemorrhages or papilledema. CN III, IV, VI:  full range of motion, no nystagmus, no ptosis CN V: facial  sensation intact CN VII: upper and lower face symmetric CN VIII: hearing intact CN IX, X: gag intact, uvula midline CN XI: sternocleidomastoid and trapezius muscles intact CN XII: tongue midline Bulk & Tone: normal, no fasciculations. Motor:  5/5 throughout Sensation:  Temperature and vibration intact Deep Tendon Reflexes:  2+ throughout, toes downgoing Finger to nose testing:  No dysmetria Heel to shin:  No dysmetria Gait:  Normal station and stride.  Able to turn and walk in tandem. Romberg negative.  IMPRESSION: New onset daily unilateral headache.  Possible primary stabbing headache.  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 Morbid obesity Hypertension  PLAN: MRI of brain with and without contrast.  If unremarkable, would refer to neuro-ophthalmology. Topiramate 50mg  at bedtime (other possible medications may include indomethacin, gabapentin or melatonin if we are to consider primary stabbing headache) Weight loss Contact PCP to have blood pressure rechecked. Follow up in 4 to 6 weeks (call in 4 weeks with update)  Thank you for allowing me to take part in the care of  this patient.  Shon Millet, DO  CC:  Mikey Kirschner

## 2015-03-13 NOTE — Patient Instructions (Addendum)
1.  Start topiramate 50mg  at bedtime.  Possible side effects include: impaired thinking, sedation, paresthesias (numbness and tingling) and weight loss.  It may cause dehydration and there is a small risk for kidney stones, so make sure to stay hydrated with water during the day.  There is also a very small risk for glaucoma, so if you notice any change in your vision while taking this medication, see an ophthalmologist.  2.  [facility] with and without contrast [facility]  03/23/15 10:45am   3.  Follow up in 4 weeks.  If you can't return in 4 weeks, call in 4 weeks with update.

## 2015-03-15 NOTE — Anesthesia Preprocedure Evaluation (Addendum)
Anesthesia Evaluation  Patient identified by MRN, date of birth, ID band Patient awake    Reviewed: Allergy & Precautions, NPO status , Patient's Chart, lab work & pertinent test results  History of Anesthesia Complications Negative for: history of anesthetic complications  Airway Mallampati: III  TM Distance: >3 FB Neck ROM: Full    Dental no notable dental hx. (+) Dental Advisory Given   Pulmonary asthma ,  breath sounds clear to auscultation  Pulmonary exam normal       Cardiovascular hypertension, Pt. on medications Normal cardiovascular examRhythm:Regular Rate:Normal     Neuro/Psych  Headaches, PSYCHIATRIC DISORDERS Anxiety Depression    GI/Hepatic Neg liver ROS, GERD-  Medicated and Controlled,  Endo/Other  Morbid obesity  Renal/GU negative Renal ROS  negative genitourinary   Musculoskeletal negative musculoskeletal ROS (+)   Abdominal   Peds negative pediatric ROS (+)  Hematology negative hematology ROS (+)   Anesthesia Other Findings   Reproductive/Obstetrics negative OB ROS                            Anesthesia Physical Anesthesia Plan  ASA: III  Anesthesia Plan: MAC   Post-op Pain Management:    Induction: Intravenous  Airway Management Planned: Nasal Cannula  Additional Equipment:   Intra-op Plan:   Post-operative Plan:   Informed Consent: I have reviewed the patients History and Physical, chart, labs and discussed the procedure including the risks, benefits and alternatives for the proposed anesthesia with the patient or authorized representative who has indicated his/her understanding and acceptance.   Dental advisory given  Plan Discussed with: CRNA  Anesthesia Plan Comments:         Anesthesia Quick Evaluation

## 2015-03-16 ENCOUNTER — Encounter (HOSPITAL_COMMUNITY): Payer: Self-pay

## 2015-03-16 ENCOUNTER — Ambulatory Visit (HOSPITAL_COMMUNITY)
Admission: RE | Admit: 2015-03-16 | Discharge: 2015-03-16 | Disposition: A | Discharge status: Home / Self Care | Payer: BLUE CROSS/BLUE SHIELD | Source: Ambulatory Visit | Attending: Gastroenterology | Admitting: Gastroenterology

## 2015-03-16 ENCOUNTER — Encounter (HOSPITAL_COMMUNITY)
Admission: RE | Disposition: A | Discharge status: Home / Self Care | Payer: Self-pay | Source: Ambulatory Visit | Attending: Gastroenterology

## 2015-03-16 ENCOUNTER — Ambulatory Visit (HOSPITAL_COMMUNITY): Payer: BLUE CROSS/BLUE SHIELD | Admitting: Anesthesiology

## 2015-03-16 DIAGNOSIS — K219 Gastro-esophageal reflux disease without esophagitis: Secondary | ICD-10-CM | POA: Diagnosis not present

## 2015-03-16 DIAGNOSIS — Z6841 Body Mass Index (BMI) 40.0 and over, adult: Secondary | ICD-10-CM | POA: Insufficient documentation

## 2015-03-16 DIAGNOSIS — L709 Acne, unspecified: Secondary | ICD-10-CM | POA: Diagnosis not present

## 2015-03-16 DIAGNOSIS — E669 Obesity, unspecified: Secondary | ICD-10-CM

## 2015-03-16 DIAGNOSIS — Z79899 Other long term (current) drug therapy: Secondary | ICD-10-CM | POA: Insufficient documentation

## 2015-03-16 DIAGNOSIS — R1310 Dysphagia, unspecified: Secondary | ICD-10-CM | POA: Diagnosis not present

## 2015-03-16 DIAGNOSIS — I10 Essential (primary) hypertension: Secondary | ICD-10-CM | POA: Insufficient documentation

## 2015-03-16 DIAGNOSIS — F329 Major depressive disorder, single episode, unspecified: Secondary | ICD-10-CM | POA: Insufficient documentation

## 2015-03-16 DIAGNOSIS — F419 Anxiety disorder, unspecified: Secondary | ICD-10-CM | POA: Insufficient documentation

## 2015-03-16 DIAGNOSIS — J45909 Unspecified asthma, uncomplicated: Secondary | ICD-10-CM | POA: Diagnosis not present

## 2015-03-16 DIAGNOSIS — R1314 Dysphagia, pharyngoesophageal phase: Secondary | ICD-10-CM

## 2015-03-16 HISTORY — PX: ESOPHAGOGASTRODUODENOSCOPY (EGD) WITH PROPOFOL: SHX5813

## 2015-03-16 SURGERY — ESOPHAGOGASTRODUODENOSCOPY (EGD) WITH PROPOFOL
Anesthesia: Monitor Anesthesia Care

## 2015-03-16 MED ORDER — LIDOCAINE HCL (CARDIAC) 20 MG/ML IV SOLN
INTRAVENOUS | Status: AC
  Filled 2015-03-16: qty 5

## 2015-03-16 MED ORDER — SODIUM CHLORIDE 0.9 % IV SOLN: INTRAVENOUS | Status: DC

## 2015-03-16 MED ORDER — PROPOFOL 500 MG/50ML IV EMUL
INTRAVENOUS | Status: DC | PRN
  Administered 2015-03-16 (×5): 40 mg via INTRAVENOUS

## 2015-03-16 MED ORDER — PROPOFOL INFUSION 10 MG/ML OPTIME
INTRAVENOUS | Status: DC | PRN
  Administered 2015-03-16: 100 ug/kg/min via INTRAVENOUS

## 2015-03-16 MED ORDER — PROPOFOL 10 MG/ML IV BOLUS
INTRAVENOUS | Status: AC
  Filled 2015-03-16: qty 20

## 2015-03-16 MED ORDER — LACTATED RINGERS IV SOLN
INTRAVENOUS | Status: DC
  Administered 2015-03-16: 09:00:00 via INTRAVENOUS

## 2015-03-16 SURGICAL SUPPLY — 14 items

## 2015-03-16 NOTE — Discharge Instructions (Signed)
°Esophageal Dilatation °The esophagus is the long, narrow tube which carries food and liquid from the mouth to the stomach. Esophageal dilatation is the technique used to stretch a blocked or narrowed portion of the esophagus. This procedure is used when a part of the esophagus has become so narrow that it becomes difficult, painful or even impossible to swallow. This is generally an uncomplicated form of treatment. When this is not successful, chest surgery may be required. This is a much more extensive form of treatment with a longer recovery time. °CAUSES  °Some of the more common causes of blockage or strictures of the esophagus are: °· Narrowing from longstanding inflammation (soreness and redness) of the lower esophagus. This comes from the constant exposure of the lower esophagus to the acid which bubbles up from the stomach. Over time this causes scarring and narrowing of the lower esophagus. °· Hiatal hernia in which a small part of the stomach bulges (herniates) up through the diaphragm. This can cause a gradual narrowing of the end of the esophagus. °· Schatzki ring is a narrow ring of benign (non-cancerous) fibrous tissue which constricts the lower esophagus. The reason for this is not known. °· Scleroderma is a connective tissue disorder that affects the esophagus and makes swallowing difficult. °· Achalasia is an absence of nerves to the lower esophagus and to the esophageal sphincter. This is the circular muscle between the stomach and esophagus that relaxes to allow food into the stomach. After swallowing, it contracts to keep food in the stomach. This absence of nerves may be congenital (present since birth). This can cause irregular spasms of the lower esophageal muscle. This spasm does not open up to allow food and fluid through. The result is a persistent blockage with subsequent slow trickling of the esophageal contents into the stomach. °· Strictures may develop from swallowing materials which  damage the esophagus. Some examples are strong acids or alkalis such as lye. °· Growths such as benign (non-cancerous) and malignant (cancerous) tumors can block the esophagus. °· Hereditary (present since birth) causes. °DIAGNOSIS  °Your caregiver often suspects this problem by taking a medical history. They will also do a physical exam. They can then prove their suspicions using X-rays and endoscopy. Endoscopy is an exam in which a tube like a small, flexible telescope is used to look at your esophagus.  °TREATMENT °There are different stretching (dilating) techniques that can be used. Simple bougie dilatation may be done in the office. This usually takes only a couple minutes. A numbing (anesthetic) spray of the throat is used. Endoscopy, when done, is done in an endoscopy suite under mild sedation. When fluoroscopy is used, the procedure is performed in X-ray. Other techniques require a little longer time. Recovery is usually quick. There is no waiting time to begin eating and drinking to test success of the treatment. Following are some of the methods used. °Narrowing of the esophagus is treated by making it bigger. °Commonly this is a mechanical problem which can be treated with stretching. This can be done in different ways. Your caregiver will discuss these with you. Some of the means used are: °· A series of graduated (increasing thickness) flexible dilators can be used. These are weighted tubes passed through the esophagus into the stomach. The tubes used become progressively larger until the desired stretched size is reached. Graduated dilators are a simple and quick way of opening the esophagus. No visualization is required. °· Another method is the use of endoscopy to place   a flexible wire across the stricture. The endoscope is removed and the wire left in place. A dilator with a hole through it from end to end is guided down the esophagus and across the stricture. One or more of these dilators are  passed over the wire. At the end of the exam, the wire is removed. This type of treatment may be performed in the X-ray department under fluoroscopy. An advantage of this procedure is the examiner is visualizing the end opening in the esophagus. °· Stretching of the esophagus may be done using balloons. Deflated balloons are placed through the endoscope and across the stricture. This type of balloon dilatation is often done at the time of endoscopy or fluoroscopy. Flexible endoscopy allows the examiner to directly view the stricture. A balloon is inserted in the deflated form into the area of narrowing. It is then inflated with air to a certain pressure that is preset for a given circumference. When inflated, it becomes sausage shaped, stretched, and makes the stricture larger. °· Achalasia requires a longer, larger balloon-type dilator. This is frequently done under X-ray control. In this situation, the spastic muscle fibers in the lower esophagus are stretched. °All of the above procedures make the passage of food and water into the stomach easier. They also make it easier for stomach contents to reflux back into the esophagus. Special medications may be used following the procedure to help prevent further stricturing. Proton-pump inhibitor medications are good at decreasing the amount of acid in the stomach juice. When stomach juice refluxes into the esophagus, the juice is no longer as acidic and is less likely to burn or scar the esophagus. °RISKS AND COMPLICATIONS °Esophageal dilatation is usually performed effectively and without problems. Some complications that can occur are: °· A small amount of bleeding almost always happens where the stretching takes place. If this is too excessive it may require more aggressive treatment. °· An uncommon complication is perforation (making a hole) of the esophagus. The esophagus is thin. It is easy to make a hole in it. If this happens, an operation may be necessary to  repair this. °· A small, undetected perforation could lead to an infection in the chest. This can be very serious. °HOME CARE INSTRUCTIONS  °· If you received sedation for your procedure, do not drive, make important decisions, or perform any activities requiring your full coordination. Do not drink alcohol, take sedatives, or use any mind altering chemicals unless instructed by your caregiver. °· You may use throat lozenges or warm salt water gargles if you have throat discomfort. °· You can begin eating and drinking normally on return home unless instructed otherwise. Do not purposely try to force large chunks of food down to test the benefits of your procedure. °· Mild discomfort can be eased with sips of ice water. °· Medications for discomfort may or may not be needed. °SEEK IMMEDIATE MEDICAL CARE IF:  °· You begin vomiting up blood. °· You develop black, tarry stools. °· You develop chills or an unexplained temperature of over 101°F (38.3°C) °· You develop chest or abdominal pain. °· You develop shortness of breath, or feel light-headed or faint. °· Your swallowing is becoming more painful, difficult, or you are unable to swallow. °MAKE SURE YOU:  °· Understand these instructions. °· Will watch your condition. °· Will get help right away if you are not doing well or get worse. °Document Released: 10/24/2005 Document Revised: 01/17/2014 Document Reviewed: 12/11/2005 °Exit[employer]® Patient Information ©2015 [employer].   This information is not intended to replace advice given to you by your health care provider. Make sure you discuss any questions you have with your health care provider. °Esophagogastroduodenoscopy °Care After °Refer to this sheet in the next few weeks. These instructions provide you with information on caring for yourself after your procedure. Your caregiver may also give you more specific instructions. Your treatment has been planned according to current medical practices, but problems sometimes  occur. Call your caregiver if you have any problems or questions after your procedure.  °HOME CARE INSTRUCTIONS °· Do not eat or drink anything until the numbing medicine (local anesthetic) has worn off and your gag reflex has returned. You will know that the local anesthetic has worn off when you can swallow comfortably. °· Do not drive for 12 hours after the procedure or as directed by your caregiver. °· Only take medicines as directed by your caregiver. °SEEK MEDICAL CARE IF:  °· You cannot stop coughing. °· You are not urinating at all or less than usual. °SEEK IMMEDIATE MEDICAL CARE IF: °· You have difficulty swallowing. °· You cannot eat or drink. °· You have worsening throat or chest pain. °· You have dizziness, lightheadedness, or you faint. °· You have nausea or vomiting. °· You have chills. °· You have a fever. °· You have severe abdominal pain. °· You have black, tarry, or bloody stools. °Document Released: 08/19/2012 Document Reviewed: 08/19/2012 °ExitCare® Patient Information ©2015 [employer]. This information is not intended to replace advice given to you by your health care provider. Make sure you discuss any questions you have with your health care provider. ° °

## 2015-03-16 NOTE — H&P (Signed)
History of Present Illness: [name] is a 19 year old white male referred at the request of Dr. Dan HumphreysWalker for evaluation of reflux. Over the past 2 months he's complaining of severe pyrosis despite taking pantoprazole twice a day. This may occur anytime throughout the day or night. It is often postprandial. He has excess belching and gas. He denies abdominal pain. When he tries to belch he may regurgitate food at which point he develops chest discomfort. He has mild dysphagia to solids.   Past Medical History  Diagnosis Date  . Acne      Skin  . Hypertension 2012    Dr. Kathrene AluArchinald  . Asthma     seasonal allergies, Dx age 19  . Anxiety   . Depression    Past Surgical History  Procedure Laterality Date  . Tonsillectomy     family history includes Aortic aneurysm in his paternal grandfather; Diabetes in his maternal uncle and mother; Heart disease in his paternal grandfather; Hypertension in his father and paternal grandfather. There is no history of Colon cancer, Colon polyps, Esophageal cancer, Kidney disease, or Gallbladder disease. Current Outpatient Prescriptions  Medication Sig Dispense Refill  . buPROPion (WELLBUTRIN XL) 150 MG 24 hr tablet Take 1 tablet (150 mg total) by mouth daily. 30 tablet 3  . Cetirizine HCl (ZYRTEC ALLERGY) 10 MG CAPS Take 1 capsule (10 mg total) by mouth daily. 90 capsule 4  . lisinopril (PRINIVIL,ZESTRIL) 10 MG tablet Take 1 tablet (10 mg total) by mouth daily. 90 tablet 4  . montelukast (SINGULAIR) 10 MG tablet Take 1 tablet (10 mg total) by mouth at bedtime. 90 tablet 4  . ondansetron (ZOFRAN) 4 MG tablet Take 1 tablet (4 mg total) by mouth every 8 (eight) hours as needed for nausea or vomiting. 20 tablet 0  . pantoprazole (PROTONIX) 40 MG tablet Take 1 tablet (40 mg total) by mouth 2 (two) times daily. 90 tablet 3  . traZODone (DESYREL) 50 MG tablet Take 1 tablet  (50 mg total) by mouth at bedtime as needed for sleep. 30 tablet 3   No current facility-administered medications for this visit.   Allergies as of 01/18/2015  . (No Known Allergies)    reports that he has never smoked. He has never used smokeless tobacco. He reports that he does not drink alcohol or use illicit drugs.   Review of Systems: Pertinent positive and negative review of systems were noted in the above HPI section. All other review of systems were otherwise negative.  Vital signs were reviewed in today's medical record Physical Exam: General: Obese male in no acute distress Skin: anicteric Head: Normocephalic and atraumatic Eyes: sclerae anicteric, EOMI Ears: Normal auditory acuity Mouth: No deformity or lesions Neck: Supple, no masses or thyromegaly Lymph Nodes: no lymphadenopathy Lungs: Clear throughout to auscultation Heart: Regular rate and rhythm; no murmurs, rubs or bruits Gastroinestinal: Soft, non tender and non distended. No masses, hepatosplenomegaly or hernias noted. Normal Bowel sounds. There is no succussion splash Rectal:deferred Musculoskeletal: Symmetrical with no gross deformities  Skin: No lesions on visible extremities Pulses: Normal pulses noted Extremities: No clubbing, cyanosis, edema or deformities noted Neurological: Alert oriented x 4, grossly nonfocal Cervical Nodes: No significant cervical adenopathy Inguinal Nodes: No significant inguinal adenopathy Psychological: Alert and cooperative. Normal mood and affect  See Assessment and Plan under Problem List                  Gastroesophageal reflux disease without esophagitis - Larry Rogers  Larry Dice, MD     Status: Written Related Problem: Gastroesophageal reflux disease without esophagitis   Expand All Collapse All   Patient is symptomatic despite twice a day Protonix.  Recommendations #1 antireflux measures #2 trial of dexilant 60 mg daily  Cc Dr. Dan Humphreys             Dysphagia, pharyngoesophageal phase - Louis Meckel, MD    Status: Written Related Problem: Dysphagia, pharyngoesophageal phase   Expand All Collapse All   Rule out early esophageal stricture and eosinophilic esophagitis.   Recommendations #1 upper endoscopy            Obesity    Status: Written Related Problem: Obesity   Expand All Collapse All   I had lengthy discussion with he and his father about his obesity including health issues related to obesity. I also explained that is probably contributing to his reflux. We discussed

## 2015-03-16 NOTE — Op Note (Signed)
[facility] [address]  [place], [id number]   ENDOSCOPY PROCEDURE REPORT  PATIENT: Larry PeacockWilson, Afshin C  MR#: [id number] BIRTHDATE: 20-Oct-1995 , 18  yrs. old GENDER: male ENDOSCOPIST: Louis Meckelobert D Kaplan, MD REFERRED BY: PROCEDURE DATE:  03/16/2015 PROCEDURE:  EGD, diagnostic and Maloney dilation of esophagus ASA CLASS:     Class II INDICATIONS:  dysphagia and history of esophageal reflux. MEDICATIONS: Monitored anesthesia care TOPICAL ANESTHETIC:  DESCRIPTION OF PROCEDURE: After the risks benefits and alternatives of the procedure were thoroughly explained, informed consent was obtained.  The Pentax Gastroscope [id number] endoscope was introduced through the mouth and advanced to the second portion of the duodenum , Without limitations.  The instrument was slowly withdrawn as the mucosa was fully examined.      EXAM: The esophagus and gastroesophageal junction were completely normal in appearance.  The stomach was entered and closely examined.The antrum, angularis, and lesser curvature were well visualized, including a retroflexed view of the cardia and fundus. The stomach wall was normally distensable.  The scope passed easily through the pylorus into the duodenum.  Retroflexed views revealed no abnormalities.     The scope was then withdrawn from the patient.  Because of complaints of dysphagia a #52 [place] Maloney dilator was passed.  There was minimal resistance and no heme..  COMPLICATIONS: There were no immediate complications.  ENDOSCOPIC IMPRESSION: GERD - chest post dilation with a 52 French Maloney dilator  RECOMMENDATIONS: continue Nexium and Carafate  REPEAT EXAM:  eSigned:  Louis Meckelobert D Kaplan, MD 03/16/2015 10:31 AM    CC: Ronna PolioJennifer Walker, MD

## 2015-03-16 NOTE — Anesthesia Postprocedure Evaluation (Signed)
  Anesthesia Post-op Note  Patient: Larry Rogers  Procedure(s) Performed: Procedure(s) (LRB): ESOPHAGOGASTRODUODENOSCOPY (EGD) WITH PROPOFOL (N/A)  Patient Location: PACU  Anesthesia Type: MAC  Level of Consciousness: awake and alert   Airway and Oxygen Therapy: Patient Spontanous Breathing  Post-op Pain: mild  Post-op Assessment: Post-op Vital signs reviewed, Patient's Cardiovascular Status Stable, Respiratory Function Stable, Patent Airway and No signs of Nausea or vomiting  Last Vitals:  Filed Vitals:   03/16/15 1050  BP:   Pulse: 77  Temp:   Resp: 21    Post-op Vital Signs: stable   Complications: No apparent anesthesia complications

## 2015-03-16 NOTE — Transfer of Care (Signed)
Immediate Anesthesia Transfer of Care Note  Patient: Larry Rogers  Procedure(s) Performed: Procedure(s): ESOPHAGOGASTRODUODENOSCOPY (EGD) WITH PROPOFOL (N/A)  Patient Location: PACU  Anesthesia Type:MAC  Level of Consciousness: awake, alert  and oriented  Airway & Oxygen Therapy: Patient Spontanous Breathing and Patient connected to nasal cannula oxygen  Post-op Assessment: Report given to RN and Post -op Vital signs reviewed and stable  Post vital signs: Reviewed and stable  Last Vitals:  Filed Vitals:   03/16/15 1020  BP:   Pulse: 84  Temp:   Resp: 21    Complications: No apparent anesthesia complications

## 2015-03-17 ENCOUNTER — Encounter (HOSPITAL_COMMUNITY): Payer: Self-pay | Admitting: Gastroenterology

## 2015-03-23 ENCOUNTER — Ambulatory Visit (HOSPITAL_COMMUNITY)
Admission: RE | Admit: 2015-03-23 | Discharge: 2015-03-23 | Disposition: A | Discharge status: Home / Self Care | Payer: BLUE CROSS/BLUE SHIELD | Source: Ambulatory Visit | Attending: Neurology | Admitting: Neurology

## 2015-03-23 DIAGNOSIS — R51 Headache: Secondary | ICD-10-CM | POA: Diagnosis present

## 2015-03-23 DIAGNOSIS — H52529 Paresis of accommodation, unspecified eye: Secondary | ICD-10-CM

## 2015-03-23 DIAGNOSIS — R110 Nausea: Secondary | ICD-10-CM | POA: Diagnosis not present

## 2015-03-23 DIAGNOSIS — G4485 Primary stabbing headache: Secondary | ICD-10-CM

## 2015-03-23 DIAGNOSIS — R519 Headache, unspecified: Secondary | ICD-10-CM

## 2015-03-23 LAB — CREATININE, SERUM
Creatinine, Ser: 0.89 mg/dL (ref 0.61–1.24)
GFR calc Af Amer: >60 mL/min (ref >60)

## 2015-03-23 MED ORDER — GADOBENATE DIMEGLUMINE 529 MG/ML IV SOLN
20.0000 mL | Freq: Once | INTRAVENOUS | Status: AC | PRN
  Administered 2015-03-23: 20 mL via INTRAVENOUS

## 2015-03-24 ENCOUNTER — Telehealth: Payer: Self-pay | Admitting: *Deleted

## 2015-03-24 NOTE — Telephone Encounter (Signed)
-----   Message from Octaviano Battyebecca S Tat, DO sent at 03/23/2015  5:09 PM EDT ----- Please let pt know that his MRI brain negative

## 2015-03-24 NOTE — Telephone Encounter (Signed)
Left message for patient to contact office regarding  Normal MRI Please let pt know that his MRI brain negative

## 2015-04-03 ENCOUNTER — Ambulatory Visit (INDEPENDENT_AMBULATORY_CARE_PROVIDER_SITE_OTHER): Payer: BLUE CROSS/BLUE SHIELD | Admitting: Internal Medicine

## 2015-04-03 ENCOUNTER — Encounter: Payer: Self-pay | Admitting: Internal Medicine

## 2015-04-03 VITALS — BP 115/79 | HR 94 | Temp 98.2°F | Ht 73.0 in | Wt 383.2 lb

## 2015-04-03 DIAGNOSIS — K219 Gastro-esophageal reflux disease without esophagitis: Secondary | ICD-10-CM

## 2015-04-03 DIAGNOSIS — F4323 Adjustment disorder with mixed anxiety and depressed mood: Secondary | ICD-10-CM | POA: Diagnosis not present

## 2015-04-03 DIAGNOSIS — G44229 Chronic tension-type headache, not intractable: Secondary | ICD-10-CM

## 2015-04-03 DIAGNOSIS — M79602 Pain in left arm: Secondary | ICD-10-CM | POA: Diagnosis not present

## 2015-04-03 NOTE — Progress Notes (Signed)
Subjective:    Patient ID: Larry Rogers, male    DOB: [birth year], 19 y.o.   MRN: [id number]  HPI  19YO male presents for follow up.  Recently seen by neurology. Had MRI brain with and without contrast which was normal. Also s/p EGD with dilation.  GERD symptoms improved. Less belching.  Headaches are improved with addition of topamax. Has follow up 8/19.  Symptoms of anxiety have improved. Looking for a new job.  Some pain in left arm over last few days. Burning down lateral shoulder. Comes and goes. No weakness or numbness.  Wt Readings from Last 3 Encounters:  04/03/15 383 lb 4 oz (173.841 kg) (100 %*, Z = 3.72)  03/13/15 379 lb 12.8 oz (172.276 kg) (100 %*, Z = 3.69)  02/14/15 373 lb (169.192 kg) (100 %*, Z = 3.64)   * Growth percentiles are based on CDC 2-20 Years data.    Past medical, surgical, family and social history per today's encounter.  Review of Systems  Constitutional: Negative for fever, chills, activity change, appetite change, fatigue and unexpected weight change.  Eyes: Negative for visual disturbance.  Respiratory: Negative for cough and shortness of breath.   Cardiovascular: Negative for chest pain, palpitations and leg swelling.  Gastrointestinal: Negative for nausea, vomiting, abdominal pain, diarrhea, constipation and abdominal distention.  Genitourinary: Negative for dysuria, urgency and difficulty urinating.  Musculoskeletal: Positive for myalgias. Negative for arthralgias and gait problem.  Skin: Negative for color change and rash.  Neurological: Negative for weakness and numbness.  Hematological: Negative for adenopathy.  Psychiatric/Behavioral: Negative for sleep disturbance and dysphoric mood. The patient is not nervous/anxious.        Objective:    BP 115/79 mmHg  Pulse 94  Temp(Src) 98.2 F (36.8 C) (Oral)  Ht  (1.854 m)  Wt 383 lb 4 oz (173.841 kg)  BMI 50.57 kg/m2  SpO2 98% Physical Exam  Constitutional: He is oriented  to person, place, and time. He appears well-developed and well-nourished. No distress.  HENT:  Head: Normocephalic and atraumatic.  Right Ear: External ear normal.  Left Ear: External ear normal.  Nose: Nose normal.  Mouth/Throat: Oropharynx is clear and moist. No oropharyngeal exudate.  Eyes: Conjunctivae and EOM are normal. Pupils are equal, round, and reactive to light. Right eye exhibits no discharge. Left eye exhibits no discharge. No scleral icterus.  Neck: Normal range of motion. Neck supple. No tracheal deviation present. No thyromegaly present.  Cardiovascular: Normal rate, regular rhythm and normal heart sounds.  Exam reveals no gallop and no friction rub.   No murmur heard. Pulmonary/Chest: Effort normal and breath sounds normal. No accessory muscle usage. No tachypnea. No respiratory distress. He has no decreased breath sounds. He has no wheezes. He has no rhonchi. He has no rales. He exhibits no tenderness.  Musculoskeletal: Normal range of motion. He exhibits no edema.  Lymphadenopathy:    He has no cervical adenopathy.  Neurological: He is alert and oriented to person, place, and time. No cranial nerve deficit. Coordination normal.  Skin: Skin is warm and dry. No rash noted. He is not diaphoretic. No erythema. No pallor.  Psychiatric: He has a normal mood and affect. His behavior is normal. Judgment and thought content normal.          Assessment & Plan:   Problem List Items Addressed This Visit      Unprioritized   Adjustment disorder with mixed anxiety and depressed mood    Symptoms improved  with Sertraline. Will continue.      Gastroesophageal reflux disease without esophagitis - Primary    Symptoms improved after EGD and esophageal dilation. Will continue Nexium. Follow up 3 months and prn.      Headache    Symptoms improved with Topamax. Reviewed MRI and notes from Dr. Everlena CooperJaffe. Follow up 3 months and prn.      Left arm pain    Exam normal. Symptoms are mild  and intermittent. Suspect muscular strain. He will call if persistent or worsening symptoms.          Return in about 3 months (around 07/04/2015) for Recheck.

## 2015-04-03 NOTE — Assessment & Plan Note (Signed)
Symptoms improved with Sertraline. Will continue. 

## 2015-04-03 NOTE — Assessment & Plan Note (Signed)
Exam normal. Symptoms are mild and intermittent. Suspect muscular strain. He will call if persistent or worsening symptoms.

## 2015-04-03 NOTE — Patient Instructions (Signed)
Continue current medications. 

## 2015-04-03 NOTE — Assessment & Plan Note (Signed)
Symptoms improved with Topamax. Reviewed MRI and notes from Dr. Everlena CooperJaffe. Follow up 3 months and prn.

## 2015-04-03 NOTE — Progress Notes (Signed)
Pre visit review using our clinic review tool, if applicable. No additional management support is needed unless otherwise documented below in the visit note. 

## 2015-04-03 NOTE — Assessment & Plan Note (Signed)
Symptoms improved after EGD and esophageal dilation. Will continue Nexium. Follow up 3 months and prn.

## 2015-04-04 ENCOUNTER — Encounter: Payer: Self-pay | Admitting: Internal Medicine

## 2015-05-05 ENCOUNTER — Encounter: Payer: Self-pay | Admitting: Neurology

## 2015-05-05 ENCOUNTER — Ambulatory Visit (INDEPENDENT_AMBULATORY_CARE_PROVIDER_SITE_OTHER): Payer: BLUE CROSS/BLUE SHIELD | Admitting: Neurology

## 2015-05-05 VITALS — BP 128/76 | HR 80 | Resp 18 | Ht 73.0 in | Wt 384.8 lb

## 2015-05-05 DIAGNOSIS — G44099 Other trigeminal autonomic cephalgias (TAC), not intractable: Secondary | ICD-10-CM

## 2015-05-05 MED ORDER — TOPIRAMATE 50 MG PO TABS: ORAL_TABLET | ORAL | Status: DC

## 2015-05-05 NOTE — Progress Notes (Signed)
NEUROLOGY FOLLOW UP OFFICE NOTE  KEIMARI [place] [id number]  HISTORY OF PRESENT ILLNESS: Larry Rogers is an 19 year old right-handed male with hypertension and anxiety who follows up for unilateral headache.  UPDATE: MRI of brain with and without contrast from 03/23/15 was normal.    Headaches are improved.  They are less frequent (by 50%) and are of shorter duration.  They resolve if he lays down and takes a nap.  He did note asymmetric pupils (larger on the right) while having a headache, which resolved in the dark. Intensity:  6-7/10 Duration:  One hour Frequency:  15 headache days per month Current abortive medication:  none Antihypertensive medications:  none Antidepressant medications:  Sertraline  Anticonvulsant medications:  topramate  Vitamins/Herbal/Supplements:  none Other therapy:  none  HISTORY: Onset:  5 months ago. Location:  Right temporal/parietal region Quality:  Sharp/stabbing Initial Intensity:  7/10 Aura:  no Prodrome:  no Associated symptoms:  Right eye tears and twitches, dilated right pupil.  Mild photophobia.  He denies double vision.  He has longstanding history of seeing floaters. Initial Duration:  Off and on throughout the day Initial Frequency:  Daily.  Goes to sleep and wakes up with it. Triggers/exacerbating factors:  none Relieving factors:  Laying down and napping Activity:  Able to function.    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  He saw an eye doctor in May who found no abnormalities but his extraocular eye movements were not checked.  He had a CT of the head without contrast performed on 02/12/15 for the headaches, as well as reported left-sided numbness.  It was negative.  PAST MEDICAL HISTORY: Past Medical History  Diagnosis Date  . Acne     [place] [facility]  .  Hypertension 2012    Dr. Kathrene Alu  . Asthma     seasonal allergies, Dx age 59  . Anxiety   . GERD (gastroesophageal reflux disease)   . Anxiety   . Headache     MEDICATIONS: Current Outpatient Prescriptions on File Prior to Visit  Medication Sig Dispense Refill  . Cetirizine HCl (ZYRTEC ALLERGY) 10 MG CAPS Take 1 capsule (10 mg total) by mouth daily. 90 capsule 4  . Esomeprazole Magnesium (NEXIUM 24HR PO) Take 44.6 mg by mouth 2 (two) times daily.    Marland Kitchen lisinopril (PRINIVIL,ZESTRIL) 10 MG tablet Take 1 tablet (10 mg total) by mouth daily. 90 tablet 4  . montelukast (SINGULAIR) 10 MG tablet Take 1 tablet (10 mg total) by mouth at bedtime. 90 tablet 4  . NEXIUM 24HR 20 MG capsule TAKE 2 CAPSULES BY MOUTH TWICE A DAY BEFORE MEALS (INSURANCE REQUIRES OTC)  2  . ondansetron (ZOFRAN) 4 MG tablet Take 1 tablet (4 mg total) by mouth every 8 (eight) hours as needed for nausea or vomiting. 20 tablet 0  . sertraline (ZOLOFT) 50 MG tablet Take 1 tablet (50 mg total) by mouth at bedtime. 30 tablet 3  . sucralfate (CARAFATE) 1 GM/10ML suspension Take 10 mLs (1 g total) by mouth 4 (four) times daily -  with meals and at bedtime. 420 mL 1  . clonazePAM (KLONOPIN) 0.5 MG tablet Take 1 tablet (0.5 mg total) by mouth 3 (three) times daily as needed for anxiety. (Patient not taking: Reported on 05/05/2015) 90 tablet 2   No current facility-administered medications on file  prior to visit.    ALLERGIES: No Known Allergies  FAMILY HISTORY: Family History  Problem Relation Age of Onset  . Hypertension Father   . Heart disease Paternal Grandfather   . Hypertension Paternal Grandfather   . Aortic aneurysm Paternal Grandfather   . Colon cancer Neg Hx   . Colon polyps Neg Hx   . Esophageal cancer Neg Hx   . Diabetes Mother   . Diabetes Maternal Uncle     x2  . Kidney disease Neg Hx   . Gallbladder disease Neg Hx     SOCIAL HISTORY: Social History   Social History  . Marital Status: Single     Spouse Name: N/A  . Number of Children: 0  . Years of Education: N/A   Occupational History  . Student     [employer]   Social History Main Topics  . Smoking status: Never Smoker   . Smokeless tobacco: Never Used  . Alcohol Use: No  . Drug Use: No  . Sexual Activity: No   Other Topics Concern  . Not on file   Social History Narrative   Lives in [place] family. Pets - dog and cat.      School - [employer], rising senior. Plans to major in accountant.      Diet - regular, tries to limit processed sugars      Exercise - started basketball, swimming    REVIEW OF SYSTEMS: Constitutional: No fevers, chills, or sweats, no generalized fatigue, change in appetite Eyes: No visual changes, double vision, eye pain Ear, nose and throat: No hearing loss, ear pain, nasal congestion, sore throat Cardiovascular: No chest pain, palpitations Respiratory:  No shortness of breath at rest or with exertion, wheezes GastrointestinaI: No nausea, vomiting, diarrhea, abdominal pain, fecal incontinence Genitourinary:  No dysuria, urinary retention or frequency Musculoskeletal:  No neck pain, back pain Integumentary: No rash, pruritus, skin lesions Neurological: as above Psychiatric: No depression, insomnia, anxiety Endocrine: No palpitations, fatigue, diaphoresis, mood swings, change in appetite, change in weight, increased thirst Hematologic/Lymphatic:  No anemia, purpura, petechiae. Allergic/Immunologic: no itchy/runny eyes, nasal congestion, recent allergic reactions, rashes  PHYSICAL EXAM: Filed Vitals:   05/05/15 0958  BP: 128/76  Pulse: 80  Resp: 18   General: No acute distress.  Patient appears well-groomed.  Morbidly obese. Head:  Normocephalic/atraumatic Eyes:  Fundoscopic exam unremarkable without vessel changes, exudates, hemorrhages or papilledema. Neck: supple, no paraspinal tenderness, full range of motion Heart:  Regular rate and rhythm Lungs:  Clear to auscultation  bilaterally Back: No paraspinal tenderness Neurological Exam: alert and oriented to person, place, and time. Attention span and concentration intact, recent and remote memory intact, fund of knowledge intact.  Speech fluent and not dysarthric, language intact.  Absence convergence on exam.  Otherwise, CN II-XII intact. Fundoscopic exam unremarkable without vessel changes, exudates, hemorrhages or papilledema.  Bulk and tone normal, muscle strength 5/5 throughout.  Sensation to light touch, temperature and vibration intact.  Deep tendon reflexes 2+ throughout, toes downgoing.  Finger to nose and heel to shin testing intact.  Gait normal, Romberg negative.  IMPRESSION: Unspecified trigeminal autonomic cephaliga.  Autonomic symptoms are usually absent with primary stabbing headache.  Absent convergence is likely not pathological Morbid obesity   PLAN: 1.  Titrate topiramate to 100mg  at bedtime to achieve further reduced frequency 2.  Weight loss 3.  Follow up in 3 months.  To call sooner if needed.  15 minutes spent face to face with patient, over  50% spent discussing diagnosis and management.  Shon Millet, DO  CC:  Ronna Polio, MD

## 2015-05-05 NOTE — Patient Instructions (Signed)
Prescribed topamax  tablets.  Take 1 and 1/2 tablets at bedtime for 7 days, then 2 tablets at bedtime.  Call for refill in 4 weeks, with update Follow up in 3 months.

## 2015-05-09 ENCOUNTER — Other Ambulatory Visit: Payer: Self-pay | Admitting: Neurology

## 2015-05-11 ENCOUNTER — Other Ambulatory Visit: Payer: Self-pay | Admitting: Gastroenterology

## 2015-06-06 ENCOUNTER — Other Ambulatory Visit: Payer: Self-pay | Admitting: Neurology

## 2015-06-06 ENCOUNTER — Other Ambulatory Visit: Payer: Self-pay | Admitting: Internal Medicine

## 2015-06-09 LAB — CBC AND DIFFERENTIAL
HEMATOCRIT: 46 % (ref 41–53)
Hemoglobin: 15.8 g/dL (ref 13.5–17.5)
NEUTROS ABS: 3 /uL
PLATELETS: 284 10*3/uL (ref 150–399)
WBC: 7.5 10*3/mL

## 2015-06-09 LAB — LIPID PANEL
Cholesterol: 137 mg/dL (ref 0–200)
HDL: 29 mg/dL — AB (ref 35–70)
LDL CALC: 73 mg/dL
Triglycerides: 177 mg/dL — AB (ref 40–160)

## 2015-06-09 LAB — BASIC METABOLIC PANEL
BUN: 14 mg/dL (ref 4–21)
Creatinine: 0.9 mg/dL (ref 0.6–1.3)
GLUCOSE: 84 mg/dL
POTASSIUM: 4.2 mmol/L (ref 3.4–5.3)
Sodium: 142 mmol/L (ref 137–147)

## 2015-06-09 LAB — HEPATIC FUNCTION PANEL
ALT: 41 U/L — AB (ref 3–30)
AST: 18 U/L (ref 2–40)
Alkaline Phosphatase: 67 U/L (ref 25–125)
Bilirubin, Total: 0.3 mg/dL

## 2015-06-09 LAB — TSH: TSH: 5.6 u[IU]/mL (ref 0.41–5.90)

## 2015-06-14 ENCOUNTER — Ambulatory Visit (INDEPENDENT_AMBULATORY_CARE_PROVIDER_SITE_OTHER): Payer: BLUE CROSS/BLUE SHIELD | Admitting: Internal Medicine

## 2015-06-14 ENCOUNTER — Encounter: Payer: Self-pay | Admitting: Internal Medicine

## 2015-06-14 VITALS — BP 127/75 | HR 100 | Temp 98.0°F | Ht 73.0 in | Wt 386.0 lb

## 2015-06-14 DIAGNOSIS — E079 Disorder of thyroid, unspecified: Secondary | ICD-10-CM | POA: Insufficient documentation

## 2015-06-14 DIAGNOSIS — R002 Palpitations: Secondary | ICD-10-CM | POA: Diagnosis not present

## 2015-06-14 DIAGNOSIS — I4891 Unspecified atrial fibrillation: Secondary | ICD-10-CM | POA: Insufficient documentation

## 2015-06-14 DIAGNOSIS — F411 Generalized anxiety disorder: Secondary | ICD-10-CM

## 2015-06-14 DIAGNOSIS — Z8679 Personal history of other diseases of the circulatory system: Secondary | ICD-10-CM | POA: Insufficient documentation

## 2015-06-14 NOTE — Progress Notes (Signed)
Pre visit review using our clinic review tool, if applicable. No additional management support is needed unless otherwise documented below in the visit note. 

## 2015-06-14 NOTE — Patient Instructions (Addendum)
Start Vit D 2000units per day.  We will set up evaluation with cardiology.  Repeat labs and visit in 6-8 weeks.

## 2015-06-14 NOTE — Assessment & Plan Note (Signed)
He reports symptoms improved on Sertraline. Will continue current dose.

## 2015-06-14 NOTE — Progress Notes (Signed)
Subjective:    Patient ID: Larry Rogers, male    DOB: October 08, 1995, 19 y.o.   MRN: [id number]  HPI  19YO male presents for follow up.  Recently feeling fatigued. Anxiety has been better. Notes intermittent palpitations, mostly at night. Described as fluttering. Not forceful beats. No CP, dyspnea. Resolve without intervention after a few minutes.  Feels sleepy, but not sleeping well at night. Has some muscle aches diffusely. No new activities. No fever, chills. Recent labs showed mild elevation of TSH 5.6. T3 and T4 were normal.  Wt Readings from Last 3 Encounters:  06/14/15 386 lb (175.088 kg) (100 %*, Z = 3.76)  05/05/15 384 lb 12.8 oz (174.544 kg) (100 %*, Z = 3.74)  04/03/15 383 lb 4 oz (173.841 kg) (100 %*, Z = 3.72)   * Growth percentiles are based on CDC 2-20 Years data.   BP Readings from Last 3 Encounters:  06/14/15 127/75  05/05/15 128/76  04/03/15 115/79    Past Medical History  Diagnosis Date  . Acne     [facility]  . Hypertension 2012    Dr. Kathrene Alu  . Asthma     seasonal allergies, Dx age 50  . Anxiety   . GERD (gastroesophageal reflux disease)   . Anxiety   . Headache    Family History  Problem Relation Age of Onset  . Hypertension Father   . Heart disease Paternal Grandfather   . Hypertension Paternal Grandfather   . Aortic aneurysm Paternal Grandfather   . Colon cancer Neg Hx   . Colon polyps Neg Hx   . Esophageal cancer Neg Hx   . Diabetes Mother   . Diabetes Maternal Uncle     x2  . Kidney disease Neg Hx   . Gallbladder disease Neg Hx    Past Surgical History  Procedure Laterality Date  . Tonsillectomy    . Esophagogastroduodenoscopy (egd) with propofol N/A 03/16/2015    Procedure: ESOPHAGOGASTRODUODENOSCOPY (EGD) WITH PROPOFOL;  Surgeon: Louis Meckel, MD;  Location: [facility];  Service: Endoscopy;  Laterality: N/A;   Social History   Social History  . Marital Status: Single    Spouse Name: N/A  . Number of Children: 0    . Years of Education: N/A   Occupational History  . Student     [employer]   Social History Main Topics  . Smoking status: Never Smoker   . Smokeless tobacco: Never Used  . Alcohol Use: No  . Drug Use: No  . Sexual Activity: No   Other Topics Concern  . None   Social History Narrative   Lives in [place] family. Pets - dog and cat.      School - [employer], rising senior. Plans to major in accountant.      Diet - regular, tries to limit processed sugars      Exercise - started basketball, swimming    Review of Systems  Constitutional: Positive for fatigue. Negative for fever, chills, activity change, appetite change and unexpected weight change.  Eyes: Negative for visual disturbance.  Respiratory: Negative for cough and shortness of breath.   Cardiovascular: Positive for palpitations. Negative for chest pain and leg swelling.  Gastrointestinal: Negative for nausea, vomiting, abdominal pain, diarrhea, constipation and abdominal distention.  Genitourinary: Negative for dysuria, urgency and difficulty urinating.  Musculoskeletal: Negative for arthralgias and gait problem.  Skin: Negative for color change and rash.  Hematological: Negative for adenopathy.  Psychiatric/Behavioral: Positive for sleep disturbance. Negative for  dysphoric mood. The patient is not nervous/anxious.        Objective:    BP 127/75 mmHg  Pulse 100  Temp(Src) 98 F (36.7 C) (Oral)  Ht  (1.854 m)  Wt 386 lb (175.088 kg)  BMI 50.94 kg/m2  SpO2 97% Physical Exam  Constitutional: He is oriented to person, place, and time. He appears well-developed and well-nourished. No distress.  HENT:  Head: Normocephalic and atraumatic.  Right Ear: External ear normal.  Left Ear: External ear normal.  Nose: Nose normal.  Mouth/Throat: Oropharynx is clear and moist. No oropharyngeal exudate.  Eyes: Conjunctivae and EOM are normal. Pupils are equal, round, and reactive to light. Right eye exhibits no  discharge. Left eye exhibits no discharge. No scleral icterus.  Neck: Normal range of motion. Neck supple. No tracheal deviation present. No thyromegaly present.  Cardiovascular: Normal rate, regular rhythm and normal heart sounds.  Exam reveals no gallop and no friction rub.   No murmur heard. Pulmonary/Chest: Effort normal and breath sounds normal. No accessory muscle usage. No tachypnea. No respiratory distress. He has no decreased breath sounds. He has no wheezes. He has no rhonchi. He has no rales. He exhibits no tenderness.  Musculoskeletal: Normal range of motion. He exhibits no edema.  Lymphadenopathy:    He has no cervical adenopathy.  Neurological: He is alert and oriented to person, place, and time. No cranial nerve deficit. Coordination normal.  Skin: Skin is warm and dry. No rash noted. He is not diaphoretic. No erythema. No pallor.  Psychiatric: He has a normal mood and affect. His behavior is normal. Judgment and thought content normal.          Assessment & Plan:   Problem List Items Addressed This Visit      Unprioritized   GAD (generalized anxiety disorder)    He reports symptoms improved on Sertraline. Will continue current dose.      Palpitations - Primary    Recent episodes of palpitations. Sinus tachycardia on EKG today. Recent labs including CBC, CMP normal. Mild elevation of TSH. Will set up cardiology referral for possible Holter.      Relevant Orders   Ambulatory referral to Cardiology   EKG 12-Lead (Completed)   Thyroid dysfunction    Recent mild elevation of TSH on labs 5.6. T4 and T3 were normal. Will plan repeat TSH in 6 weeks.          Return in about 2 months (around 08/14/2015) for Recheck.

## 2015-06-14 NOTE — Assessment & Plan Note (Signed)
Recent episodes of palpitations. Sinus tachycardia on EKG today. Recent labs including CBC, CMP normal. Mild elevation of TSH. Will set up cardiology referral for possible Holter.

## 2015-06-14 NOTE — Assessment & Plan Note (Signed)
Recent mild elevation of TSH on labs 5.6. T4 and T3 were normal. Will plan repeat TSH in 6 weeks.

## 2015-06-22 ENCOUNTER — Encounter: Payer: Self-pay | Admitting: Internal Medicine

## 2015-07-02 ENCOUNTER — Encounter: Payer: Self-pay | Admitting: Internal Medicine

## 2015-07-04 ENCOUNTER — Encounter: Payer: Self-pay | Admitting: Internal Medicine

## 2015-07-04 ENCOUNTER — Ambulatory Visit (INDEPENDENT_AMBULATORY_CARE_PROVIDER_SITE_OTHER): Payer: BLUE CROSS/BLUE SHIELD | Admitting: Internal Medicine

## 2015-07-04 VITALS — BP 137/73 | HR 99 | Temp 98.0°F | Ht 73.0 in | Wt 391.0 lb

## 2015-07-04 DIAGNOSIS — F411 Generalized anxiety disorder: Secondary | ICD-10-CM

## 2015-07-04 DIAGNOSIS — R0683 Snoring: Secondary | ICD-10-CM

## 2015-07-04 MED ORDER — CLONAZEPAM 0.5 MG PO TABS: 0.5000 mg | ORAL_TABLET | Freq: Three times a day (TID) | ORAL | Status: DC | PRN

## 2015-07-04 MED ORDER — SERTRALINE HCL 50 MG PO TABS: 100.0000 mg | ORAL_TABLET | Freq: Every day | ORAL | Status: DC

## 2015-07-04 NOTE — Progress Notes (Signed)
Pre visit review using our clinic review tool, if applicable. No additional management support is needed unless otherwise documented below in the visit note. 

## 2015-07-04 NOTE — Patient Instructions (Addendum)
Increase Sertraline to 75mg  daily for 1 week, then increase to 100mg  daily.  Continue Clonazepam as needed.  Set goal of walking 1 mile daily.  We will set up sleep study.

## 2015-07-04 NOTE — Assessment & Plan Note (Signed)
Anxiety poorly controlled. Will increase Sertraline to 75mg  daily for 1 week, then to 100mg  daily. Continue prn Clonazepam. Follow up with psychiatry as scheduled. Follow up here in 2 weeks and prn.

## 2015-07-04 NOTE — Progress Notes (Signed)
Subjective:    Patient ID: Larry Rogers, male    DOB: 1995-12-16, 19 y.o.   MRN: [id number]  HPI  19YO male presents for follow up.  Anxiety -  Intermittent left arm pain. Makes him worry about heart attack. Not sleeping well. Sleeps all day long. Wakes up during sleep with heart racing. Mother notes him snoring. Constantly worried.  Mother and father very worried about his wellbeing. He has quit work and quit school. He stays inside all day. Scheduled to see psychiatrist at [facility].    Wt Readings from Last 3 Encounters:  07/04/15 391 lb (177.356 kg) (100 %*, Z = 3.80)  06/14/15 386 lb (175.088 kg) (100 %*, Z = 3.76)  05/05/15 384 lb 12.8 oz (174.544 kg) (100 %*, Z = 3.74)   * Growth percentiles are based on CDC 2-20 Years data.   BP Readings from Last 3 Encounters:  07/04/15 137/73  06/14/15 127/75  05/05/15 128/76    Past Medical History  Diagnosis Date  . Acne     [place] [facility]  . Hypertension 2012    Dr. Kathrene Alu  . Asthma     seasonal allergies, Dx age 42  . Anxiety   . GERD (gastroesophageal reflux disease)   . Anxiety   . Headache    Family History  Problem Relation Age of Onset  . Hypertension Father   . Heart disease Paternal Grandfather   . Hypertension Paternal Grandfather   . Aortic aneurysm Paternal Grandfather   . Colon cancer Neg Hx   . Colon polyps Neg Hx   . Esophageal cancer Neg Hx   . Diabetes Mother   . Diabetes Maternal Uncle     x2  . Kidney disease Neg Hx   . Gallbladder disease Neg Hx    Past Surgical History  Procedure Laterality Date  . Tonsillectomy    . Esophagogastroduodenoscopy (egd) with propofol N/A 03/16/2015    Procedure: ESOPHAGOGASTRODUODENOSCOPY (EGD) WITH PROPOFOL;  Surgeon: Louis Meckel, MD;  Location: [facility];  Service: Endoscopy;  Laterality: N/A;   Social History   Social History  . Marital Status: Single    Spouse Name: N/A  . Number of Children: 0  . Years of Education: N/A   Occupational  History  . Student     [employer]   Social History Main Topics  . Smoking status: Never Smoker   . Smokeless tobacco: Never Used  . Alcohol Use: No  . Drug Use: No  . Sexual Activity: No   Other Topics Concern  . None   Social History Narrative   Lives in [place] family. Pets - dog and cat.      School - [employer], rising senior. Plans to major in accountant.      Diet - regular, tries to limit processed sugars      Exercise - started basketball, swimming    Review of Systems  Constitutional: Negative for fever, chills, activity change, appetite change, fatigue and unexpected weight change.  Eyes: Negative for visual disturbance.  Respiratory: Negative for cough and shortness of breath.   Cardiovascular: Negative for chest pain, palpitations and leg swelling.  Gastrointestinal: Negative for abdominal pain and abdominal distention.  Genitourinary: Negative for dysuria, urgency and difficulty urinating.  Musculoskeletal: Negative for arthralgias and gait problem.  Skin: Negative for color change and rash.  Hematological: Negative for adenopathy.  Psychiatric/Behavioral: Positive for behavioral problems and sleep disturbance. Negative for dysphoric mood. The patient is nervous/anxious.  Objective:    BP 137/73 mmHg  Pulse 99  Temp(Src) 98 F (36.7 C) (Oral)  Ht 6\' 1"  (1.854 m)  Wt 391 lb (177.356 kg)  BMI 51.60 kg/m2  SpO2 98% Physical Exam  Constitutional: He is oriented to person, place, and time. He appears well-developed and well-nourished. No distress.  HENT:  Head: Normocephalic and atraumatic.  Right Ear: External ear normal.  Left Ear: External ear normal.  Nose: Nose normal.  Mouth/Throat: Oropharynx is clear and moist. No oropharyngeal exudate.  Eyes: Conjunctivae and EOM are normal. Pupils are equal, round, and reactive to light. Right eye exhibits no discharge. Left eye exhibits no discharge. No scleral icterus.  Neck: Normal range of motion.  Neck supple. No tracheal deviation present. No thyromegaly present.  Cardiovascular: Normal rate, regular rhythm and normal heart sounds.  Exam reveals no gallop and no friction rub.   No murmur heard. Pulmonary/Chest: Effort normal and breath sounds normal. No accessory muscle usage. No tachypnea. No respiratory distress. He has no decreased breath sounds. He has no wheezes. He has no rhonchi. He has no rales. He exhibits no tenderness.  Musculoskeletal: Normal range of motion. He exhibits no edema.  Lymphadenopathy:    He has no cervical adenopathy.  Neurological: He is alert and oriented to person, place, and time. No cranial nerve deficit. Coordination normal.  Skin: Skin is warm and dry. No rash noted. He is not diaphoretic. No erythema. No pallor.  Psychiatric: His speech is normal and behavior is normal. Judgment and thought content normal. His mood appears anxious. Cognition and memory are normal. He expresses no suicidal ideation.          Assessment & Plan:   Problem List Items Addressed This Visit      Unprioritized   GAD (generalized anxiety disorder) - Primary    Anxiety poorly controlled. Will increase Sertraline to 75mg  daily for 1 week, then to 100mg  daily. Continue prn Clonazepam. Follow up with psychiatry as scheduled. Follow up here in 2 weeks and prn.      Relevant Medications   sertraline (ZOLOFT) 50 MG tablet   clonazePAM (KLONOPIN) 0.5 MG tablet   Snoring    Symptoms are concerning for sleep apnea. Will set up home sleep study.      Relevant Orders   Ambulatory referral to Sleep Studies       Return in about 2 weeks (around 07/18/2015) for Recheck.

## 2015-07-04 NOTE — Assessment & Plan Note (Signed)
Symptoms are concerning for sleep apnea. Will set up home sleep study.

## 2015-07-06 ENCOUNTER — Telehealth: Payer: Self-pay | Admitting: Internal Medicine

## 2015-07-06 NOTE — Telephone Encounter (Signed)
Called pt to check in. He reports he is feeling less anxious than before.  He has increased Sertraline dose and started walking daily yesterday. He is still scheduled to meet with psychiatry. No new concerns.

## 2015-07-18 ENCOUNTER — Ambulatory Visit (INDEPENDENT_AMBULATORY_CARE_PROVIDER_SITE_OTHER): Payer: BLUE CROSS/BLUE SHIELD | Admitting: Internal Medicine

## 2015-07-18 ENCOUNTER — Encounter: Payer: Self-pay | Admitting: Internal Medicine

## 2015-07-18 VITALS — BP 133/77 | HR 96 | Temp 98.2°F | Ht 72.0 in | Wt 392.0 lb

## 2015-07-18 DIAGNOSIS — E669 Obesity, unspecified: Secondary | ICD-10-CM | POA: Diagnosis not present

## 2015-07-18 DIAGNOSIS — G4700 Insomnia, unspecified: Secondary | ICD-10-CM | POA: Diagnosis not present

## 2015-07-18 DIAGNOSIS — J4520 Mild intermittent asthma, uncomplicated: Secondary | ICD-10-CM | POA: Diagnosis not present

## 2015-07-18 DIAGNOSIS — F411 Generalized anxiety disorder: Secondary | ICD-10-CM | POA: Diagnosis not present

## 2015-07-18 MED ORDER — TRAZODONE HCL 50 MG PO TABS: 25.0000 mg | ORAL_TABLET | Freq: Every evening | ORAL | Status: DC | PRN

## 2015-07-18 NOTE — Assessment & Plan Note (Signed)
Wt Readings from Last 3 Encounters:  07/18/15 392 lb (177.81 kg) (100 %*, Z = 3.81)  07/04/15 391 lb (177.356 kg) (100 %*, Z = 3.80)  06/14/15 386 lb (175.088 kg) (100 %*, Z = 3.76)   * Growth percentiles are based on CDC 2-20 Years data.   Body mass index is 53.15 kg/(m^2).   Encouraged healthy diet and exercise.

## 2015-07-18 NOTE — Progress Notes (Signed)
Pre visit review using our clinic review tool, if applicable. No additional management support is needed unless otherwise documented below in the visit note. 

## 2015-07-18 NOTE — Assessment & Plan Note (Signed)
Symptoms improved on higher dose of Sertraline. Continue follow up with psychiatry. Continue prn Clonazepam.

## 2015-07-18 NOTE — Progress Notes (Signed)
Subjective:    Patient ID: Larry Rogers, male    DOB: Feb 22, 1996, 19 y.o.   MRN: [id number]  HPI  19YO male presents for follow up.  Last seen 10/18 for GAD. Increased Sertraline to  daily. Symptoms improved somewhat.  Seen by psychiatry. Instructed in relaxation techniques. Felt that meeting with psychiatry was helpful. Planning to meet every 2 weeks right now.  Taking Clonazepam at night, but cannot sleep at night. Started home sleep study at night. Completes 2 night study today.  Started walking and going to gym during day, [employer], with sister. Lifting weights.    Wt Readings from Last 3 Encounters:  07/18/15 392 lb (177.81 kg) (100 %*, Z = 3.81)  07/04/15 391 lb (177.356 kg) (100 %*, Z = 3.80)  06/14/15 386 lb (175.088 kg) (100 %*, Z = 3.76)   * Growth percentiles are based on CDC 2-20 Years data.   BP Readings from Last 3 Encounters:  07/18/15 133/77  07/04/15 137/73  06/14/15 127/75    Past Medical History  Diagnosis Date  . Acne     [facility]  . Hypertension 2012    Dr. Kathrene Alu  . Asthma     seasonal allergies, Dx age 37  . Anxiety   . GERD (gastroesophageal reflux disease)   . Anxiety   . Headache    Family History  Problem Relation Age of Onset  . Hypertension Father   . Heart disease Paternal Grandfather   . Hypertension Paternal Grandfather   . Aortic aneurysm Paternal Grandfather   . Colon cancer Neg Hx   . Colon polyps Neg Hx   . Esophageal cancer Neg Hx   . Diabetes Mother   . Diabetes Maternal Uncle     x2  . Kidney disease Neg Hx   . Gallbladder disease Neg Hx    Past Surgical History  Procedure Laterality Date  . Tonsillectomy    . Esophagogastroduodenoscopy (egd) with propofol N/A 03/16/2015    Procedure: ESOPHAGOGASTRODUODENOSCOPY (EGD) WITH PROPOFOL;  Surgeon: Louis Meckel, MD;  Location: [facility];  Service: Endoscopy;  Laterality: N/A;   Social History   Social History  . Marital Status: Single    Spouse  Name: N/A  . Number of Children: 0  . Years of Education: N/A   Occupational History  . Student     [employer]   Social History Main Topics  . Smoking status: Never Smoker   . Smokeless tobacco: Never Used  . Alcohol Use: No  . Drug Use: No  . Sexual Activity: No   Other Topics Concern  . None   Social History Narrative   Lives in [place] family. Pets - dog and cat.      School - [employer], rising senior. Plans to major in accountant.      Diet - regular, tries to limit processed sugars      Exercise - started basketball, swimming    Review of Systems  Constitutional: Negative for fever, chills, activity change, appetite change, fatigue and unexpected weight change.  Eyes: Negative for visual disturbance.  Respiratory: Negative for cough and shortness of breath.   Cardiovascular: Negative for chest pain, palpitations and leg swelling.  Gastrointestinal: Negative for nausea, vomiting, abdominal pain, diarrhea, constipation and abdominal distention.  Genitourinary: Negative for dysuria, urgency and difficulty urinating.  Musculoskeletal: Negative for arthralgias and gait problem.  Skin: Negative for color change and rash.  Hematological: Negative for adenopathy.  Psychiatric/Behavioral: Positive for sleep disturbance.  Negative for suicidal ideas and dysphoric mood. The patient is nervous/anxious.        Objective:    BP 133/77 mmHg  Pulse 96  Temp(Src) 98.2 F (36.8 C) (Oral)  Ht 6' (1.829 m)  Wt 392 lb (177.81 kg)  BMI 53.15 kg/m2  SpO2 98% Physical Exam  Constitutional: He is oriented to person, place, and time. He appears well-developed and well-nourished. No distress.  HENT:  Head: Normocephalic and atraumatic.  Right Ear: External ear normal.  Left Ear: External ear normal.  Nose: Nose normal.  Mouth/Throat: Oropharynx is clear and moist. No oropharyngeal exudate.  Eyes: Conjunctivae and EOM are normal. Pupils are equal, round, and reactive to light.  Right eye exhibits no discharge. Left eye exhibits no discharge. No scleral icterus.  Neck: Normal range of motion. Neck supple. No tracheal deviation present. No thyromegaly present.  Cardiovascular: Normal rate, regular rhythm and normal heart sounds.  Exam reveals no gallop and no friction rub.   No murmur heard. Pulmonary/Chest: Effort normal and breath sounds normal. No accessory muscle usage. No tachypnea. No respiratory distress. He has no decreased breath sounds. He has no wheezes. He has no rhonchi. He has no rales. He exhibits no tenderness.  Musculoskeletal: Normal range of motion. He exhibits no edema.  Lymphadenopathy:    He has no cervical adenopathy.  Neurological: He is alert and oriented to person, place, and time. No cranial nerve deficit. Coordination normal.  Skin: Skin is warm and dry. No rash noted. He is not diaphoretic. No erythema. No pallor.  Psychiatric: His speech is normal and behavior is normal. Judgment and thought content normal. His mood appears anxious. Cognition and memory are normal.          Assessment & Plan:   Problem List Items Addressed This Visit      Unprioritized   Asthma, chronic    Currently asymptomatic. Encouraged exercise. Continue antihistamines and Singulair.       GAD (generalized anxiety disorder) - Primary    Symptoms improved on higher dose of Sertraline. Continue follow up with psychiatry. Continue prn Clonazepam.      Insomnia    Persistent symptoms of insomnia. Will add Trazodone 25-50mg  daily. Follow up by email and in 2 weeks.      Relevant Medications   traZODone (DESYREL) 50 MG tablet   Obesity    Wt Readings from Last 3 Encounters:  07/18/15 392 lb (177.81 kg) (100 %*, Z = 3.81)  07/04/15 391 lb (177.356 kg) (100 %*, Z = 3.80)  06/14/15 386 lb (175.088 kg) (100 %*, Z = 3.76)   * Growth percentiles are based on CDC 2-20 Years data.   Body mass index is 53.15 kg/(m^2).   Encouraged healthy diet and  exercise.           Return in about 4 weeks (around 08/15/2015) for Recheck.

## 2015-07-18 NOTE — Assessment & Plan Note (Signed)
Currently asymptomatic. Encouraged exercise. Continue antihistamines and Singulair.

## 2015-07-18 NOTE — Assessment & Plan Note (Signed)
Persistent symptoms of insomnia. Will add Trazodone 25-50mg  daily. Follow up by email and in 2 weeks.

## 2015-07-18 NOTE — Patient Instructions (Signed)
Start Trazodone 25mg -50mg  at bedtime to help with sleep.  Continue daily exercise.  Follow up 4 weeks.

## 2015-07-20 ENCOUNTER — Telehealth: Payer: Self-pay | Admitting: Internal Medicine

## 2015-07-20 NOTE — Telephone Encounter (Signed)
Sleep Study was normal.

## 2015-08-01 ENCOUNTER — Encounter: Payer: Self-pay | Admitting: Cardiovascular Disease

## 2015-08-01 ENCOUNTER — Ambulatory Visit (INDEPENDENT_AMBULATORY_CARE_PROVIDER_SITE_OTHER): Payer: BLUE CROSS/BLUE SHIELD | Admitting: Cardiovascular Disease

## 2015-08-01 VITALS — BP 128/90 | HR 97 | Ht 75.0 in | Wt 394.5 lb

## 2015-08-01 DIAGNOSIS — M79602 Pain in left arm: Secondary | ICD-10-CM | POA: Diagnosis not present

## 2015-08-01 DIAGNOSIS — E669 Obesity, unspecified: Secondary | ICD-10-CM | POA: Diagnosis not present

## 2015-08-01 DIAGNOSIS — R002 Palpitations: Secondary | ICD-10-CM

## 2015-08-01 DIAGNOSIS — M79603 Pain in arm, unspecified: Secondary | ICD-10-CM | POA: Insufficient documentation

## 2015-08-01 DIAGNOSIS — R079 Chest pain, unspecified: Secondary | ICD-10-CM | POA: Insufficient documentation

## 2015-08-01 DIAGNOSIS — I10 Essential (primary) hypertension: Secondary | ICD-10-CM | POA: Diagnosis not present

## 2015-08-01 NOTE — Patient Instructions (Signed)
You are doing well. No medication changes were made.  Please monitor your heart rate with activity There is a pill to slow the heart rate , Lasts 4 to 6 hours, can be taken as needed (propranolol)  I suspect your arm pain is musculoskleletal in nature ICE, massage, aleve/advil  Please call us if you have new issues that need to be addressed before your next appt.

## 2015-08-01 NOTE — Assessment & Plan Note (Signed)
Rare atypical chest pain, no symptoms with exertion such as playing basketball Recent stress test with no EKG changes concerning for ischemia

## 2015-08-01 NOTE — Assessment & Plan Note (Signed)
We have encouraged continued exercise, careful diet management in an effort to lose weight. 

## 2015-08-01 NOTE — Assessment & Plan Note (Signed)
Tachycardia on clinical exam, only when listening. Seems to have labile heart rate. Denies having any significant tachycardia at rest. He monitors heart rate with fitbit. He will monitor heart rates on exertion such as when he plays basketball Trying to avoid adding beta blocker. If he was symptomatic with shortness of breath on playing sports, could potentially add propranolol but it sounds as if he is not very symptomatic in general

## 2015-08-01 NOTE — Progress Notes (Signed)
Patient ID: Larry Rogers, male    DOB: [birth year], 19 y.o.   MRN: [id number]  HPI Comments: Larry Rogers is a pleasant 19 year old gentleman with obesity, severe GERD, belching, previous symptoms of hand tingling,  previously seen for chest pain symptoms. Prior stress test with no indication of ischemia, tachycardia on minimal exertion   In follow-up today, he is concerned about some left arm pain. He has had this over the past several weeks. Denies having any pain on exertion, will develop left arm pain that is reproducible on palpation and with movement of his left arm typically at rest, sitting on the couch. He plays basketball several days per week with no problems. Denies any arm pain when he plays basketball. He is right-handed, pain symptoms on the left. Feels like he has to pop out his elbow when he has this left arm pain. Denies any significant chest pain. Does report having labile blood pressure but typically when he plays video games at home, heart rate from 60 up to mid 80s. does not have significant shortness of breath when he plays basketball  EKG on today's visit shows normal sinus rhythm with rate 97 bpm, no significant ST or T-wave changes  Other past medical history Previously reported hand tingling when he was driving home from school, that then developed into chest pain in the left upper area and lower pectoral area. He was able to continue driving home, symptoms lasted 1-2 hours. He's never had symptoms like this before and wonders if it could be from a panic attack.   Sometimes when he drinks soda, some of his chest pain is relieved with belching  No Known Allergies  Current Outpatient Prescriptions on File Prior to Visit  Medication Sig Dispense Refill  . cetirizine (ZYRTEC) 10 MG tablet TAKE 1 TABLET (10 MG) BY ORAL ROUTE ONCE DAILY  4  . clonazePAM (KLONOPIN) 0.5 MG tablet Take 1 tablet (0.5 mg total) by mouth 3 (three) times daily as needed for anxiety. 90 tablet 2   . fluticasone (FLONASE) 50 MCG/ACT nasal spray INHALE 2 SPRAYS (100 MCG) IN EACH NOSTRIL BY INTRANASAL ROUTE ONCE DAILY AS NEEDED.  4  . lisinopril (PRINIVIL,ZESTRIL) 10 MG tablet Take 1 tablet (10 mg total) by mouth daily. 90 tablet 4  . montelukast (SINGULAIR) 10 MG tablet Take 1 tablet (10 mg total) by mouth at bedtime. 90 tablet 4  . NEXIUM 24HR 20 MG capsule TAKE 2 CAPSULES BY MOUTH TWICE A DAY BEFORE MEALS (INSURANCE REQUIRES OTC) 126 capsule 2  . ondansetron (ZOFRAN) 4 MG tablet Take 1 tablet (4 mg total) by mouth every 8 (eight) hours as needed for nausea or vomiting. 20 tablet 0  . sertraline (ZOLOFT) 50 MG tablet Take 2 tablets (100 mg total) by mouth daily. 60 tablet 3  . sucralfate (CARAFATE) 1 GM/10ML suspension Take 10 mLs (1 g total) by mouth 4 (four) times daily -  with meals and at bedtime. (Patient taking differently: Take 1 g by mouth as needed. ) 420 mL 1  . topiramate (TOPAMAX) 50 MG tablet TAKE 1 &1/2 TABLETS BY MOUTH AT BEDTIME FOR 7 DAYS, THEN 2 TABS AT BEDTIME (Patient taking differently: Take 2 tablets by mouth at bedtime.) 60 tablet 5  . traZODone (DESYREL) 50 MG tablet Take 0.5-1 tablets (25-50 mg total) by mouth at bedtime as needed for sleep. 30 tablet 3   No current facility-administered medications on file prior to visit.    Past Medical History  Diagnosis Date  . Acne     [facility]  . Hypertension 2012    Dr. Kathrene AluArchinald  . Asthma     seasonal allergies, Dx age 89  . Anxiety   . GERD (gastroesophageal reflux disease)   . Anxiety   . Headache     Past Surgical History  Procedure Laterality Date  . Tonsillectomy    . Esophagogastroduodenoscopy (egd) with propofol N/A 03/16/2015    Procedure: ESOPHAGOGASTRODUODENOSCOPY (EGD) WITH PROPOFOL;  Surgeon: Louis Meckelobert D Kaplan, MD;  Location: [facility];  Service: Endoscopy;  Laterality: N/A;    Social History  reports that he has never smoked. He has never used smokeless tobacco. He reports that he does not  drink alcohol or use illicit drugs.  Family History family history includes Aortic aneurysm in his paternal grandfather; Diabetes in his maternal uncle and mother; Heart disease in his paternal grandfather; Hypertension in his father and paternal grandfather. There is no history of Colon cancer, Colon polyps, Esophageal cancer, Kidney disease, or Gallbladder disease.   Review of Systems  Constitutional: Negative.   Respiratory: Negative.   Cardiovascular: Negative.   Gastrointestinal: Negative.   Musculoskeletal: Positive for myalgias.  Allergic/Immunologic: Negative.   Neurological: Negative.   Hematological: Negative.   Psychiatric/Behavioral: Negative.   All other systems reviewed and are negative.   BP 128/90 mmHg  Pulse 97  Ht 6\' 3"  (1.905 m)  Wt 394 lb 8 oz (178.944 kg)  BMI 49.31 kg/m2  Physical Exam  Constitutional: He is oriented to person, place, and time. He appears well-developed and well-nourished.  Obese  HENT:  Head: Normocephalic.  Nose: Nose normal.  Mouth/Throat: Oropharynx is clear and moist.  Eyes: Conjunctivae are normal. Pupils are equal, round, and reactive to light.  Neck: Normal range of motion. Neck supple. No JVD present.  Cardiovascular: Normal rate, regular rhythm, normal heart sounds and intact distal pulses.  Exam reveals no gallop and no friction rub.   No murmur heard. Pulmonary/Chest: Effort normal and breath sounds normal. No respiratory distress. He has no wheezes. He has no rales. He exhibits no tenderness.  Abdominal: Soft. Bowel sounds are normal. He exhibits no distension. There is no tenderness.  Musculoskeletal: Normal range of motion. He exhibits no edema or tenderness.  Lymphadenopathy:    He has no cervical adenopathy.  Neurological: He is alert and oriented to person, place, and time. Coordination normal.  Skin: Skin is warm and dry. No rash noted. No erythema.  Psychiatric: He has a normal mood and affect. His behavior is  normal. Judgment and thought content normal.

## 2015-08-01 NOTE — Assessment & Plan Note (Signed)
Atypical arm pain, reproducible with palpation and movement. Clicking his elbow by movement. He is more concerned about orthopedic issue

## 2015-08-01 NOTE — Assessment & Plan Note (Addendum)
Diastolic mildly elevated. Will monitor for now. No changes made to the medications.

## 2015-08-07 ENCOUNTER — Encounter: Payer: Self-pay | Admitting: Neurology

## 2015-08-07 ENCOUNTER — Ambulatory Visit (INDEPENDENT_AMBULATORY_CARE_PROVIDER_SITE_OTHER): Payer: BLUE CROSS/BLUE SHIELD | Admitting: Neurology

## 2015-08-07 VITALS — BP 134/74 | HR 123 | Ht 75.0 in | Wt 396.9 lb

## 2015-08-07 DIAGNOSIS — G43009 Migraine without aura, not intractable, without status migrainosus: Secondary | ICD-10-CM

## 2015-08-07 MED ORDER — TOPIRAMATE 50 MG PO TABS: 150.0000 mg | ORAL_TABLET | Freq: Every day | ORAL | Status: DC

## 2015-08-07 NOTE — Patient Instructions (Signed)
We will increase topiramate 50mg  tablets to 3 tablets (total of 150mg ) at bedtime.  Call in 4 weeks with update.  Follow up in 5 months.

## 2015-08-07 NOTE — Progress Notes (Addendum)
NEUROLOGY FOLLOW UP OFFICE NOTE  Larry Rogers [id number]  HISTORY OF PRESENT ILLNESS: Larry Rogers is an 19 year old right-handed male with hypertension and anxiety who follows up for unilateral headache.  UPDATE: Headaches continue to improve Intensity:  5/10 Duration:  30 minutes to 2 hours Frequency:  10 headache days per month Current abortive medication:  sometimes Tylenol, but usually none Antihypertensive medications:  none Antidepressant medications:  Sertraline 50mg  Anticonvulsant medications:  topramate 100mg  Vitamins/Herbal/Supplements:  none Other therapy:  none  Recent BMP normal.  HISTORY: Onset:  5 months ago. Location:  Right temporal/parietal region Quality:  Sharp/stabbing Initial Intensity:  7/10; August 6-7/10 Aura:  no Prodrome:  no Associated symptoms:  Right eye tears and twitches, dilated right pupil.  Mild photophobia.  He denies double vision.  He has longstanding history of seeing floaters. Initial Duration:  Off and on throughout the day; August 1 hour Initial Frequency:  Daily.  Goes to sleep and wakes up with it.; August 15 headache days per month Triggers/exacerbating factors:  none Relieving factors:  Laying down and napping Activity:  Able to function.    Past abortive medication:  Tylenol, Excedrin, Advil, Ibuprofen (ineffective)  He had a CT of the head without contrast performed on 02/12/15 for the headaches, as well as reported left-sided numbness.  It was negative.  MRI of brain with and without contrast from 03/23/15 was normal.     PAST MEDICAL HISTORY: Past Medical History  Diagnosis Date  . Acne     [facility]  . Hypertension 2012    Dr. Kathrene AluArchinald  . Asthma     seasonal allergies, Dx age 89  . Anxiety   . GERD (gastroesophageal reflux disease)   . Anxiety   . Headache     MEDICATIONS: Current Outpatient Prescriptions on File Prior to Visit  Medication Sig Dispense Refill  . cetirizine (ZYRTEC) 10 MG tablet  TAKE 1 TABLET (10 MG) BY ORAL ROUTE ONCE DAILY  4  . clonazePAM (KLONOPIN) 0.5 MG tablet Take 1 tablet (0.5 mg total) by mouth 3 (three) times daily as needed for anxiety. 90 tablet 2  . fluticasone (FLONASE) 50 MCG/ACT nasal spray INHALE 2 SPRAYS (100 MCG) IN EACH NOSTRIL BY INTRANASAL ROUTE ONCE DAILY AS NEEDED.  4  . lisinopril (PRINIVIL,ZESTRIL) 10 MG tablet Take 1 tablet (10 mg total) by mouth daily. 90 tablet 4  . montelukast (SINGULAIR) 10 MG tablet Take 1 tablet (10 mg total) by mouth at bedtime. 90 tablet 4  . NEXIUM 24HR 20 MG capsule TAKE 2 CAPSULES BY MOUTH TWICE A DAY BEFORE MEALS (INSURANCE REQUIRES OTC) 126 capsule 2  . ondansetron (ZOFRAN) 4 MG tablet Take 1 tablet (4 mg total) by mouth every 8 (eight) hours as needed for nausea or vomiting. 20 tablet 0  . sertraline (ZOLOFT) 50 MG tablet Take 2 tablets (100 mg total) by mouth daily. 60 tablet 3  . sucralfate (CARAFATE) 1 GM/10ML suspension Take 10 mLs (1 g total) by mouth 4 (four) times daily -  with meals and at bedtime. (Patient taking differently: Take 1 g by mouth as needed. ) 420 mL 1  . traZODone (DESYREL) 50 MG tablet Take 0.5-1 tablets (25-50 mg total) by mouth at bedtime as needed for sleep. 30 tablet 3   No current facility-administered medications on file prior to visit.    ALLERGIES: No Known Allergies  FAMILY HISTORY: Family History  Problem Relation Age of Onset  . Hypertension Father   .  Heart disease Paternal Grandfather   . Hypertension Paternal Grandfather   . Aortic aneurysm Paternal Grandfather   . Colon cancer Neg Hx   . Colon polyps Neg Hx   . Esophageal cancer Neg Hx   . Diabetes Mother   . Diabetes Maternal Uncle     x2  . Kidney disease Neg Hx   . Gallbladder disease Neg Hx     SOCIAL HISTORY: Social History   Social History  . Marital Status: Single    Spouse Name: N/A  . Number of Children: 0  . Years of Education: N/A   Occupational History  . Student     [employer]   Social History  Main Topics  . Smoking status: Never Smoker   . Smokeless tobacco: Never Used  . Alcohol Use: No  . Drug Use: No  . Sexual Activity: No   Other Topics Concern  . Not on file   Social History Narrative   Lives in [place] family. Pets - dog and cat.      School - [employer], rising senior. Plans to major in accountant.      Diet - regular, tries to limit processed sugars      Exercise - started basketball, swimming    REVIEW OF SYSTEMS: Constitutional: No fevers, chills, or sweats, no generalized fatigue, change in appetite Eyes: No visual changes, double vision, eye pain Ear, nose and throat: No hearing loss, ear pain, nasal congestion, sore throat Cardiovascular: No chest pain, palpitations Respiratory:  No shortness of breath at rest or with exertion, wheezes GastrointestinaI: No nausea, vomiting, diarrhea, abdominal pain, fecal incontinence Genitourinary:  No dysuria, urinary retention or frequency Musculoskeletal:  No neck pain, back pain Integumentary: No rash, pruritus, skin lesions Neurological: as above Psychiatric: No depression, insomnia, anxiety Endocrine: No palpitations, fatigue, diaphoresis, mood swings, change in appetite, change in weight, increased thirst Hematologic/Lymphatic:  No anemia, purpura, petechiae. Allergic/Immunologic: no itchy/runny eyes, nasal congestion, recent allergic reactions, rashes  PHYSICAL EXAM: Filed Vitals:   08/07/15 1052  BP: 134/74  Pulse: 123   General: No acute distress.  Patient appears well-groomed.  Morbidly obese body habitus. Head:  Normocephalic/atraumatic Eyes:  Fundoscopic exam unremarkable without vessel changes, exudates, hemorrhages or papilledema. Neck: supple, no paraspinal tenderness, full range of motion Heart:  Regular rate and rhythm Lungs:  Clear to auscultation bilaterally Back: No paraspinal tenderness Neurological Exam: alert and oriented to person, place, and time. Attention span and  concentration intact, recent and remote memory intact, fund of knowledge intact.  Speech fluent and not dysarthric, language intact.  Absent convergence.  Otherwise, CN II-XII intact. Fundoscopic exam unremarkable without vessel changes, exudates, hemorrhages or papilledema.  Bulk and tone normal, muscle strength 5/5 throughout.  Sensation to light touch, temperature and vibration intact.  Deep tendon reflexes 2+ throughout.  Finger to nose and heel to shin testing intact.  Gait normal, Romberg negative.  IMPRESSION: Migraine without aura, improved Morbid obesity  PLAN: Increase topiramate to  daily Call in 4 weeks with update Follow up in 5 months. Weight loss  15 minutes spent face to face with patient, over 50% spent discussing management.  Shon Millet, DO  CC:  Ronna Polio, MD

## 2015-08-14 ENCOUNTER — Ambulatory Visit: Payer: BLUE CROSS/BLUE SHIELD | Admitting: Internal Medicine

## 2015-08-22 ENCOUNTER — Encounter: Payer: Self-pay | Admitting: Internal Medicine

## 2015-08-22 ENCOUNTER — Ambulatory Visit (INDEPENDENT_AMBULATORY_CARE_PROVIDER_SITE_OTHER): Payer: BLUE CROSS/BLUE SHIELD | Admitting: Internal Medicine

## 2015-08-22 VITALS — BP 124/83 | HR 85 | Temp 98.7°F | Ht 75.0 in | Wt >= 6400 oz

## 2015-08-22 DIAGNOSIS — G43009 Migraine without aura, not intractable, without status migrainosus: Secondary | ICD-10-CM | POA: Diagnosis not present

## 2015-08-22 DIAGNOSIS — G4700 Insomnia, unspecified: Secondary | ICD-10-CM

## 2015-08-22 DIAGNOSIS — F411 Generalized anxiety disorder: Secondary | ICD-10-CM | POA: Diagnosis not present

## 2015-08-22 NOTE — Progress Notes (Signed)
Subjective:    Patient ID: Larry Rogers, male    DOB: [birth year], [age] y.o.   MRN: [id number]  HPI  19YO male presents for follow up.  Last seen 11/1 for GAD. Added Trazodone to help with insomnia.  Started taking Trazodone 25-50mg  at night. Has been sleeping better. Goes to bed 1-2am then wakes at 9-10am. Much better than previous. Feels more rested during daytime.  Started walking daily, then after having a bad sunburn the Friday after Thanksgiving, developed some lightheadedness. This has improved. No chest pain.   Has not recently seen psychiatry. Scheduled to see Monday. Anxiety has been improved.  Recently seen by Dr. Everlena CooperJaffe. Dose of Topamax increased to 150mg  daily. No recent headaches.  Wt Readings from Last 3 Encounters:  08/22/15 401 lb 4 oz (182.006 kg) (100 %*, Z = 3.88)  08/07/15 396 lb 14.4 oz (180.033 kg) (100 %*, Z = 3.85)  08/01/15 394 lb 8 oz (178.944 kg) (100 %*, Z = 3.83)   * Growth percentiles are based on CDC 2-20 Years data.   BP Readings from Last 3 Encounters:  08/22/15 124/83  08/07/15 134/74  08/01/15 128/90    Past Medical History  Diagnosis Date  . Acne     [facility]  . Hypertension 2012    Dr. Kathrene AluArchinald  . Asthma     seasonal allergies, Dx age 89  . Anxiety   . GERD (gastroesophageal reflux disease)   . Anxiety   . Headache    Family History  Problem Relation Age of Onset  . Hypertension Father   . Heart disease Paternal Grandfather   . Hypertension Paternal Grandfather   . Aortic aneurysm Paternal Grandfather   . Colon cancer Neg Hx   . Colon polyps Neg Hx   . Esophageal cancer Neg Hx   . Diabetes Mother   . Diabetes Maternal Uncle     x2  . Kidney disease Neg Hx   . Gallbladder disease Neg Hx    Past Surgical History  Procedure Laterality Date  . Tonsillectomy    . Esophagogastroduodenoscopy (egd) with propofol N/A 03/16/2015    Procedure: ESOPHAGOGASTRODUODENOSCOPY (EGD) WITH PROPOFOL;  Surgeon: Louis Meckelobert D Kaplan, MD;   Location: [facility];  Service: Endoscopy;  Laterality: N/A;   Social History   Social History  . Marital Status: Single    Spouse Name: N/A  . Number of Children: 0  . Years of Education: N/A   Occupational History  . Student     [employer]   Social History Main Topics  . Smoking status: Never Smoker   . Smokeless tobacco: Never Used  . Alcohol Use: No  . Drug Use: No  . Sexual Activity: No   Other Topics Concern  . None   Social History Narrative   [place]. Pets - dog and cat.      School - [employer], rising senior. Plans to major in accountant.      Diet - regular, tries to limit processed sugars      Exercise - started basketball, swimming    Review of Systems  Constitutional: Negative for fever, chills, activity change, appetite change, fatigue and unexpected weight change.  Eyes: Negative for visual disturbance.  Respiratory: Negative for cough and shortness of breath.   Cardiovascular: Negative for chest pain, palpitations and leg swelling.  Gastrointestinal: Negative for abdominal pain and abdominal distention.  Genitourinary: Negative for dysuria, urgency and difficulty urinating.  Musculoskeletal: Negative for arthralgias  and gait problem.  Skin: Negative for color change and rash.  Neurological: Positive for light-headedness. Negative for weakness and headaches.  Hematological: Negative for adenopathy.  Psychiatric/Behavioral: Positive for sleep disturbance. Negative for suicidal ideas and dysphoric mood. The patient is not nervous/anxious.        Objective:    BP 124/83 mmHg  Pulse 85  Temp(Src) 98.7 F (37.1 C) (Oral)  Ht  (1.905 m)  Wt 401 lb 4 oz (182.006 kg)  BMI 50.15 kg/m2  SpO2 98% Physical Exam  Constitutional: He is oriented to person, place, and time. He appears well-developed and well-nourished. No distress.  HENT:  Head: Normocephalic and atraumatic.  Right Ear: External ear normal.  Left Ear: External ear  normal.  Nose: Nose normal.  Mouth/Throat: Oropharynx is clear and moist. No oropharyngeal exudate.  Eyes: Conjunctivae and EOM are normal. Pupils are equal, round, and reactive to light. Right eye exhibits no discharge. Left eye exhibits no discharge. No scleral icterus.  Neck: Normal range of motion. Neck supple. No tracheal deviation present. No thyromegaly present.  Cardiovascular: Normal rate, regular rhythm and normal heart sounds.  Exam reveals no gallop and no friction rub.   No murmur heard. Pulmonary/Chest: Effort normal and breath sounds normal. No accessory muscle usage. No tachypnea. No respiratory distress. He has no decreased breath sounds. He has no wheezes. He has no rhonchi. He has no rales. He exhibits no tenderness.  Musculoskeletal: Normal range of motion. He exhibits no edema.  Lymphadenopathy:    He has no cervical adenopathy.  Neurological: He is alert and oriented to person, place, and time. No cranial nerve deficit. Coordination normal.  Skin: Skin is warm and dry. No rash noted. He is not diaphoretic. No erythema. No pallor.  Psychiatric: He has a normal mood and affect. His behavior is normal. Judgment and thought content normal.          Assessment & Plan:   Problem List Items Addressed This Visit      Unprioritized   GAD (generalized anxiety disorder)    Symptoms improved with Sertraline. Will continue. Follow up with psychiatry next week as scheduled.      Insomnia - Primary    Symptoms improved with Trazodone. Will continue.      Migraine without aura and without status migrainosus, not intractable    Reviewed notes from Dr. Everlena Cooper. Does of Topamax increased. No recent headaches. Follow up as scheduled.          Return in about 3 months (around 11/20/2015) for Recheck.

## 2015-08-22 NOTE — Patient Instructions (Signed)
Consider using Elta MD tinted sunscreen daily to prevent sunburn.

## 2015-08-22 NOTE — Assessment & Plan Note (Signed)
Symptoms improved with Trazodone. Will continue. 

## 2015-08-22 NOTE — Progress Notes (Signed)
Pre visit review using our clinic review tool, if applicable. No additional management support is needed unless otherwise documented below in the visit note. 

## 2015-08-22 NOTE — Assessment & Plan Note (Signed)
Symptoms improved with Sertraline. Will continue. Follow up with psychiatry next week as scheduled.

## 2015-08-22 NOTE — Assessment & Plan Note (Signed)
Reviewed notes from Dr. Everlena CooperJaffe. Does of Topamax increased. No recent headaches. Follow up as scheduled.

## 2015-08-29 ENCOUNTER — Encounter: Payer: Self-pay | Admitting: Internal Medicine

## 2015-08-31 ENCOUNTER — Encounter: Payer: Self-pay | Admitting: *Deleted

## 2015-09-01 ENCOUNTER — Ambulatory Visit: Payer: BLUE CROSS/BLUE SHIELD | Admitting: Internal Medicine

## 2015-09-02 LAB — TSH: TSH: 2.73 u[IU]/mL (ref 0.41–5.90)

## 2015-09-03 ENCOUNTER — Other Ambulatory Visit: Payer: Self-pay | Admitting: Internal Medicine

## 2015-09-04 ENCOUNTER — Telehealth: Payer: Self-pay | Admitting: Internal Medicine

## 2015-09-04 ENCOUNTER — Encounter: Payer: Self-pay | Admitting: Internal Medicine

## 2015-09-04 NOTE — Telephone Encounter (Signed)
Thyroid function normal.

## 2015-09-06 ENCOUNTER — Ambulatory Visit (INDEPENDENT_AMBULATORY_CARE_PROVIDER_SITE_OTHER): Payer: BLUE CROSS/BLUE SHIELD | Admitting: Internal Medicine

## 2015-09-06 ENCOUNTER — Encounter: Payer: Self-pay | Admitting: Internal Medicine

## 2015-09-06 VITALS — BP 118/88 | HR 93 | Temp 98.7°F | Ht 72.0 in | Wt >= 6400 oz

## 2015-09-06 DIAGNOSIS — R002 Palpitations: Secondary | ICD-10-CM | POA: Diagnosis not present

## 2015-09-06 DIAGNOSIS — L0390 Cellulitis, unspecified: Secondary | ICD-10-CM | POA: Diagnosis not present

## 2015-09-06 MED ORDER — GENTAMICIN SULFATE 0.1 % EX OINT: 1.0000 "application " | TOPICAL_OINTMENT | Freq: Three times a day (TID) | CUTANEOUS | Status: DC

## 2015-09-06 MED ORDER — CLOTRIMAZOLE 1 % EX CREA: 1.0000 "application " | TOPICAL_CREAM | Freq: Two times a day (BID) | CUTANEOUS | Status: DC

## 2015-09-06 NOTE — Assessment & Plan Note (Signed)
Exam today is normal. EKG normal. We discussed that the FitBit may not be accurate consistently measuring heart rate. HR in 70s would be considered normal. Encouraged exercise. If measured HR in 40s then we discussed referral back to cardiology for a Holter monitor.

## 2015-09-06 NOTE — Progress Notes (Signed)
Subjective:    Patient ID: Larry Rogers, male    DOB: 08-18-1996, 19 y.o.   MRN: [id number]  HPI  19YO male presents for acute visit.  Concerned about heart rate - Heart rate at night on fitbit was lower at night, in the 40s, and would not increase beyond 70s with walking. However the last few nights, has not gotten that low. No chest pain. Aware of heart beats but no forceful or irregular beats. Had thyroid checked at work which was normal.  Also notes redness and purulent drainage from belly button over last several weeks. Has been cleaning with soap and water with no improvement. No fever, chills.   Wt Readings from Last 3 Encounters:  09/06/15 401 lb (181.892 kg) (100 %*, Z = 3.88)  08/22/15 401 lb 4 oz (182.006 kg) (100 %*, Z = 3.88)  08/07/15 396 lb 14.4 oz (180.033 kg) (100 %*, Z = 3.85)   * Growth percentiles are based on CDC 2-20 Years data.   BP Readings from Last 3 Encounters:  09/06/15 118/88  08/22/15 124/83  08/07/15 134/74    Past Medical History  Diagnosis Date  . Acne     [facility]  . Hypertension 2012    Dr. Kathrene AluArchinald  . Asthma     seasonal allergies, Dx age 89  . Anxiety   . GERD (gastroesophageal reflux disease)   . Anxiety   . Headache    Family History  Problem Relation Age of Onset  . Hypertension Father   . Heart disease Paternal Grandfather   . Hypertension Paternal Grandfather   . Aortic aneurysm Paternal Grandfather   . Colon cancer Neg Hx   . Colon polyps Neg Hx   . Esophageal cancer Neg Hx   . Diabetes Mother   . Diabetes Maternal Uncle     x2  . Kidney disease Neg Hx   . Gallbladder disease Neg Hx    Past Surgical History  Procedure Laterality Date  . Tonsillectomy    . Esophagogastroduodenoscopy (egd) with propofol N/A 03/16/2015    Procedure: ESOPHAGOGASTRODUODENOSCOPY (EGD) WITH PROPOFOL;  Surgeon: Louis Meckelobert D Kaplan, MD;  Location: [facility];  Service: Endoscopy;  Laterality: N/A;   Social History   Social History   . Marital Status: Single    Spouse Name: N/A  . Number of Children: 0  . Years of Education: N/A   Occupational History  . Student     [employer]   Social History Main Topics  . Smoking status: Never Smoker   . Smokeless tobacco: Never Used  . Alcohol Use: No  . Drug Use: No  . Sexual Activity: No   Other Topics Concern  . None   Social History Narrative   [place] in [place]. Pets - dog and cat.      School - [employer], rising senior. Plans to major in accountant.      Diet - regular, tries to limit processed sugars      Exercise - started basketball, swimming    Review of Systems  Constitutional: Negative for fever, chills, activity change, appetite change, fatigue and unexpected weight change.  Eyes: Negative for visual disturbance.  Respiratory: Negative for cough and shortness of breath.   Cardiovascular: Positive for palpitations. Negative for chest pain and leg swelling.  Gastrointestinal: Negative for abdominal pain and abdominal distention.  Genitourinary: Negative for dysuria, urgency and difficulty urinating.  Musculoskeletal: Negative for arthralgias and gait problem.  Skin: Positive for color  change. Negative for rash.  Hematological: Negative for adenopathy.  Psychiatric/Behavioral: Negative for sleep disturbance and dysphoric mood. The patient is not nervous/anxious.        Objective:    BP 118/88 mmHg  Pulse 93  Temp(Src) 98.7 F (37.1 C) (Oral)  Ht 6' (1.829 m)  Wt 401 lb (181.892 kg)  BMI 54.37 kg/m2  SpO2 98% Physical Exam  Constitutional: He is oriented to person, place, and time. He appears well-developed and well-nourished. No distress.  HENT:  Head: Normocephalic and atraumatic.  Right Ear: External ear normal.  Left Ear: External ear normal.  Nose: Nose normal.  Mouth/Throat: Oropharynx is clear and moist. No oropharyngeal exudate.  Eyes: Conjunctivae and EOM are normal. Pupils are equal, round, and reactive to light. Right  eye exhibits no discharge. Left eye exhibits no discharge. No scleral icterus.  Neck: Normal range of motion. Neck supple. No tracheal deviation present. No thyromegaly present.  Cardiovascular: Normal rate, regular rhythm and normal heart sounds.  Exam reveals no gallop and no friction rub.   No murmur heard. Pulmonary/Chest: Effort normal and breath sounds normal. No accessory muscle usage. No tachypnea. No respiratory distress. He has no decreased breath sounds. He has no wheezes. He has no rhonchi. He has no rales. He exhibits no tenderness.  Musculoskeletal: Normal range of motion. He exhibits no edema.  Lymphadenopathy:    He has no cervical adenopathy.  Neurological: He is alert and oriented to person, place, and time. No cranial nerve deficit. Coordination normal.  Skin: Skin is warm and dry. No rash noted. He is not diaphoretic. There is erythema. No pallor.     Psychiatric: He has a normal mood and affect. His behavior is normal. Judgment and thought content normal.          Assessment & Plan:   Problem List Items Addressed This Visit      Unprioritized   Cellulitis    Cellulitis of umbilicus. Will start both Gentamicin and Lotrimin. Culture sent today. Follow up recheck in 2 weeks or sooner if symptoms are worsening.      Relevant Orders   Wound culture   Palpitations - Primary    Exam today is normal. EKG normal. We discussed that the FitBit may not be accurate consistently measuring heart rate. HR in 70s would be considered normal. Encouraged exercise. If measured HR in 40s then we discussed referral back to cardiology for a Holter monitor.      Relevant Orders   EKG 12-Lead (Completed)       Return in about 2 weeks (around 09/20/2015) for Recheck.

## 2015-09-06 NOTE — Assessment & Plan Note (Signed)
Cellulitis of umbilicus. Will start both Gentamicin and Lotrimin. Culture sent today. Follow up recheck in 2 weeks or sooner if symptoms are worsening.

## 2015-09-06 NOTE — Progress Notes (Signed)
Pre visit review using our clinic review tool, if applicable. No additional management support is needed unless otherwise documented below in the visit note. 

## 2015-09-06 NOTE — Patient Instructions (Signed)
Start Gentamicin ointment twice daily to belly button.  Start Lotrimin cream twice daily to belly button.  Call if redness is worsening.  Recheck in 2 weeks.

## 2015-09-08 ENCOUNTER — Telehealth: Payer: Self-pay | Admitting: Neurology

## 2015-09-08 ENCOUNTER — Other Ambulatory Visit: Payer: Self-pay | Admitting: Neurology

## 2015-09-08 MED ORDER — TOPIRAMATE 50 MG PO TABS: 50.0000 mg | ORAL_TABLET | Freq: Three times a day (TID) | ORAL | Status: DC

## 2015-09-08 NOTE — Telephone Encounter (Signed)
Topamax refill requested. Per last office note- patient to remain on medication. His mother states he is doing really well on increased dose. Refill approved and sent to patient's pharmacy.

## 2015-09-08 NOTE — Telephone Encounter (Signed)
Pt mother Karoline Caldwellngie called and states patient is out of medication Topamax 50 mg he takes it 3 times a day [facility] [address] in [place] pt phone number is [phone]

## 2015-09-09 LAB — WOUND CULTURE: GRAM STAIN: NONE SEEN

## 2015-09-26 ENCOUNTER — Ambulatory Visit: Payer: BLUE CROSS/BLUE SHIELD | Admitting: Internal Medicine

## 2015-10-02 ENCOUNTER — Other Ambulatory Visit: Payer: Self-pay | Admitting: Internal Medicine

## 2015-10-03 NOTE — Telephone Encounter (Signed)
Seen in December and medication refilled in October. Please advise?

## 2015-10-09 ENCOUNTER — Ambulatory Visit: Payer: Self-pay | Admitting: Internal Medicine

## 2015-10-11 ENCOUNTER — Encounter: Payer: Self-pay | Admitting: Internal Medicine

## 2015-10-17 ENCOUNTER — Encounter: Payer: Self-pay | Admitting: Internal Medicine

## 2015-10-17 ENCOUNTER — Ambulatory Visit (INDEPENDENT_AMBULATORY_CARE_PROVIDER_SITE_OTHER): Payer: BLUE CROSS/BLUE SHIELD | Admitting: Internal Medicine

## 2015-10-17 NOTE — Assessment & Plan Note (Signed)
Severe obesity. Discussed options both medical and surgical to help with weight loss. Will set up evaluation with bariatric surgeon. Follow up here in 4 weeks.

## 2015-10-17 NOTE — Patient Instructions (Signed)
We will set up evaluation with bariatric surgery.  Bariatric Surgery Information Bariatric surgery, also called weight loss surgery, is a procedure that helps you lose weight. You may consider or your health care provider may suggest bariatric surgery if:  You are severely obese and have been unable to lose weight through diet and exercise.  You have health problems related to obesity, such as:  Type 2 diabetes.  Heart disease.  Lung disease. HOW DOES BARIATRIC SURGERY HELP ME LOSE WEIGHT?  Bariatric surgery helps you lose weight by decreasing how much food your body absorbs. This is done by closing off part of your stomach to make it smaller. This restricts the amount of food your stomach can hold. Bariatric surgery can also change your body's regular digestive process, so that food bypasses the parts of your body that absorb calories and nutrients.  If you decide to have bariatric surgery, it is important to continue to eat a healthy diet and exercise regularly after the surgery.  WHAT ARE THE DIFFERENT KINDS OF BARIATRIC SURGERY?  There are two kinds of bariatric surgeries:  Restrictive surgeries make your stomach smaller. They do not change your digestive process. The smaller the size of your new stomach, the less food you can eat. There are different types of restrictive surgeries.  Malabsorptive surgeries both make your stomach smaller and alter your digestive process so that your body processes less calories and nutrients. These are the most common kind of bariatric surgery. There are different types of malabsorptive surgeries. WHAT ARE THE DIFFERENT TYPES OF RESTRICTIVE SURGERY? Adjustable Gastric Banding In this procedure, an inflatable band is placed around your stomach near the upper end. This makes the passageway for food into the rest of your stomach much smaller. The band can be adjusted, making it tighter or looser, by filling it with salt solution. Your surgeon can adjust  the band based on how are you feeling and how much weight you are losing. The band can be removed in the future.  Vertical Banded Gastroplasty In this procedure, staples are used to separate your stomach into two parts, a small upper pouch and a bigger lower pouch. This decreases how much food you can eat. Sleeve Gastrectomy In this procedure, your stomach is made smaller. This is done by surgically removing a large part of your stomach. When your stomach is smaller, you feel full more quickly and reduce how much you eat. WHAT ARE THE DIFFERENT TYPES OF MALABSORPTIVE SURGERY? Roux-en-Y Gastric Bypass (RGB) This is the most common weight loss surgery. In this procedure, a small stomach pouch is created in the upper part of your stomach. Next, this small stomach pouch is attached directly to the middle part of your small intestine. The farther down your small intestine the new connection is made, the fewer calories and nutrients you will absorb.  Biliopancreatic Diversion with Duodenal Switch (BPD/DS)  This is a multi-step procedure. In this procedure, a large part of your stomach is removed, making your stomach smaller. Next, this smaller stomach is attached to the lower part of your small intestine. Like the RGB surgery, you absorb fewer calories and nutrients the farther down your small intestine the attachment is made.   WHAT ARE THE RISKS OF BARIATRIC SURGERY? As with any surgical procedure, each type of bariatric surgery has its own risks. These risks also depend on your age, your overall health, and any other medical conditions you may have. When deciding on bariatric surgery, it is very important  to:   Talk to your health care provider and choose the surgery that is best for you.  Ask your health care provider about specific risks for the surgery you choose. FOR MORE INFORMATION  American Society for Metabolic & Bariatric Surgery: www.asmbs.org  Weight-control Information Network (WIN):  win.StageSync.si   This information is not intended to replace advice given to you by your health care provider. Make sure you discuss any questions you have with your health care provider.   Document Released: 09/02/2005 Document Revised: 05/24/2015 Document Reviewed: 03/03/2013 Elsevier Interactive Patient Education [employer]

## 2015-10-17 NOTE — Progress Notes (Signed)
Pre visit review using our clinic review tool, if applicable. No additional management support is needed unless otherwise documented below in the visit note. 

## 2015-10-17 NOTE — Progress Notes (Signed)
Subjective:    Patient ID: Larry Rogers, male    DOB: 1996-03-15, 20 y.o.   MRN: [id number]  HPI  19YO male presents for followup.  Obesity - would like to discuss options for weight loss. Interested in surgical option. He has tried to lose weight several times, each time, following a diet for a week or two, then failing. He notes some shortness of breath with walking and overall fatigue which he attributes to his weight.  Wt Readings from Last 3 Encounters:  10/17/15 409 lb 6 oz (185.691 kg) (100 %*, Z = 3.95)  09/06/15 401 lb (181.892 kg) (100 %*, Z = 3.88)  08/22/15 401 lb 4 oz (182.006 kg) (100 %*, Z = 3.88)   * Growth percentiles are based on CDC 2-20 Years data.   BP Readings from Last 3 Encounters:  10/17/15 135/99  09/06/15 118/88  08/22/15 124/83    Past Medical History  Diagnosis Date  . Acne     [facility] [facility]  . Hypertension 2012    Dr. Kathrene Alu  . Asthma     seasonal allergies, Dx age 31  . Anxiety   . GERD (gastroesophageal reflux disease)   . Anxiety   . Headache    Family History  Problem Relation Age of Onset  . Hypertension Father   . Heart disease Paternal Grandfather   . Hypertension Paternal Grandfather   . Aortic aneurysm Paternal Grandfather   . Colon cancer Neg Hx   . Colon polyps Neg Hx   . Esophageal cancer Neg Hx   . Diabetes Mother   . Diabetes Maternal Uncle     x2  . Kidney disease Neg Hx   . Gallbladder disease Neg Hx    Past Surgical History  Procedure Laterality Date  . Tonsillectomy    . Esophagogastroduodenoscopy (egd) with propofol N/A 03/16/2015    Procedure: ESOPHAGOGASTRODUODENOSCOPY (EGD) WITH PROPOFOL;  Surgeon: Louis Meckel, MD;  Location: [facility];  Service: Endoscopy;  Laterality: N/A;   Social History   Social History  . Marital Status: Single    Spouse Name: N/A  . Number of Children: 0  . Years of Education: N/A   Occupational History  . Student     [employer]   Social History Main Topics  .  Smoking status: Never Smoker   . Smokeless tobacco: Never Used  . Alcohol Use: No  . Drug Use: No  . Sexual Activity: No   Other Topics Concern  . None   Social History Narrative   Lives in [place] family. Pets - dog and cat.      School - [employer], rising senior. Plans to major in accountant.      Diet - regular, tries to limit processed sugars      Exercise - started basketball, swimming    Review of Systems  Constitutional: Positive for fatigue. Negative for fever, chills, activity change, appetite change and unexpected weight change.  Eyes: Negative for visual disturbance.  Respiratory: Positive for shortness of breath. Negative for cough.   Cardiovascular: Negative for chest pain, palpitations and leg swelling.  Gastrointestinal: Negative for abdominal pain, diarrhea, constipation and abdominal distention.  Genitourinary: Negative for dysuria, urgency and difficulty urinating.  Musculoskeletal: Negative for arthralgias and gait problem.  Skin: Negative for color change and rash.  Hematological: Negative for adenopathy.  Psychiatric/Behavioral: Negative for sleep disturbance and dysphoric mood. The patient is nervous/anxious.        Objective:  BP 135/99 mmHg  Pulse 101  Temp(Src) 98.4 F (36.9 C) (Oral)  Ht 6' (1.829 m)  Wt 409 lb 6 oz (185.691 kg)  BMI 55.51 kg/m2  SpO2 100% Physical Exam  Constitutional: He is oriented to person, place, and time. He appears well-developed and well-nourished. No distress.  HENT:  Head: Normocephalic and atraumatic.  Right Ear: External ear normal.  Left Ear: External ear normal.  Nose: Nose normal.  Mouth/Throat: Oropharynx is clear and moist. No oropharyngeal exudate.  Eyes: Conjunctivae and EOM are normal. Pupils are equal, round, and reactive to light. Right eye exhibits no discharge. Left eye exhibits no discharge. No scleral icterus.  Neck: Normal range of motion. Neck supple. No tracheal deviation present. No  thyromegaly present.  Cardiovascular: Normal rate, regular rhythm and normal heart sounds.  Exam reveals no gallop and no friction rub.   No murmur heard. Pulmonary/Chest: Effort normal and breath sounds normal. No accessory muscle usage. No tachypnea. No respiratory distress. He has no decreased breath sounds. He has no wheezes. He has no rhonchi. He has no rales. He exhibits no tenderness.  Musculoskeletal: Normal range of motion. He exhibits no edema.  Lymphadenopathy:    He has no cervical adenopathy.  Neurological: He is alert and oriented to person, place, and time. No cranial nerve deficit. Coordination normal.  Skin: Skin is warm and dry. No rash noted. He is not diaphoretic. No erythema. No pallor.  Psychiatric: He has a normal mood and affect. His behavior is normal. Judgment and thought content normal.          Assessment & Plan:  Over of which >50% spent in face-to-face contact with patient discussing plan of care.  Problem List Items Addressed This Visit      Unprioritized   Severe obesity (BMI >= 40) (HCC) - Primary    Severe obesity. Discussed options both medical and surgical to help with weight loss. Will set up evaluation with bariatric surgeon. Follow up here in 4 weeks.      Relevant Orders   Ambulatory referral to General Surgery       Return in about 4 weeks (around 11/14/2015) for Recheck.

## 2015-10-31 ENCOUNTER — Other Ambulatory Visit: Payer: Self-pay | Admitting: Internal Medicine

## 2015-10-31 NOTE — Telephone Encounter (Signed)
Last OV 10/17/15 ok to refill Zoloft

## 2015-11-03 ENCOUNTER — Encounter: Payer: Self-pay | Admitting: Internal Medicine

## 2015-11-03 ENCOUNTER — Ambulatory Visit (INDEPENDENT_AMBULATORY_CARE_PROVIDER_SITE_OTHER): Payer: BLUE CROSS/BLUE SHIELD | Admitting: Internal Medicine

## 2015-11-03 VITALS — BP 120/90 | HR 120 | Temp 98.4°F | Resp 16 | Ht 72.0 in | Wt >= 6400 oz

## 2015-11-03 DIAGNOSIS — H65111 Acute and subacute allergic otitis media (mucoid) (sanguinous) (serous), right ear: Secondary | ICD-10-CM | POA: Diagnosis not present

## 2015-11-03 DIAGNOSIS — H6690 Otitis media, unspecified, unspecified ear: Secondary | ICD-10-CM | POA: Insufficient documentation

## 2015-11-03 DIAGNOSIS — R519 Headache, unspecified: Secondary | ICD-10-CM | POA: Insufficient documentation

## 2015-11-03 DIAGNOSIS — R51 Headache: Secondary | ICD-10-CM

## 2015-11-03 MED ORDER — AMOXICILLIN-POT CLAVULANATE 875-125 MG PO TABS: 1.0000 | ORAL_TABLET | Freq: Two times a day (BID) | ORAL | Status: DC

## 2015-11-03 NOTE — Assessment & Plan Note (Signed)
Encouraged follow up with bariatric surgery as scheduled.

## 2015-11-03 NOTE — Progress Notes (Signed)
Subjective:    Patient ID: Larry Rogers, male    DOB: 1996/01/22, 20 y.o.   MRN: [id number]  HPI  19YO male presents for acute visit.  Noticed some veins on scalp about 1 week ago. Headache started a few days ago. Yesterday, had headache all day. Dull pain bilateral temples. Head felt heavy. No nausea. No photo or phono phobia. Took Tylenol for headache. Headache is gone today.  Scheduled to see bariatric surgeon 2/28.  Notes some nasal congestion over last few weeks and some bilateral ear pain.   Wt Readings from Last 3 Encounters:  11/03/15 414 lb (187.789 kg) (100 %*, Z = 3.98)  10/17/15 409 lb 6 oz (185.691 kg) (100 %*, Z = 3.95)  09/06/15 401 lb (181.892 kg) (100 %*, Z = 3.88)   * Growth percentiles are based on CDC 2-20 Years data.   BP Readings from Last 3 Encounters:  11/03/15 120/90  10/17/15 135/99  09/06/15 118/88    Past Medical History  Diagnosis Date  . Acne     [facility]  . Hypertension 2012    Dr. Kathrene Alu  . Asthma     seasonal allergies, Dx age 27  . Anxiety   . GERD (gastroesophageal reflux disease)   . Anxiety   . Headache    Family History  Problem Relation Age of Onset  . Hypertension Father   . Heart disease Paternal Grandfather   . Hypertension Paternal Grandfather   . Aortic aneurysm Paternal Grandfather   . Colon cancer Neg Hx   . Colon polyps Neg Hx   . Esophageal cancer Neg Hx   . Diabetes Mother   . Diabetes Maternal Uncle     x2  . Kidney disease Neg Hx   . Gallbladder disease Neg Hx    Past Surgical History  Procedure Laterality Date  . Tonsillectomy    . Esophagogastroduodenoscopy (egd) with propofol N/A 03/16/2015    Procedure: ESOPHAGOGASTRODUODENOSCOPY (EGD) WITH PROPOFOL;  Surgeon: Louis Meckel, MD;  Location: [facility];  Service: Endoscopy;  Laterality: N/A;   Social History   Social History  . Marital Status: Single    Spouse Name: N/A  . Number of Children: 0  . Years of Education: N/A    Occupational History  . Student     [employer]   Social History Main Topics  . Smoking status: Never Smoker   . Smokeless tobacco: Never Used  . Alcohol Use: No  . Drug Use: No  . Sexual Activity: No   Other Topics Concern  . None   Social History Narrative   Lives in [place] family. Pets - dog and cat.      School - [employer], rising senior. Plans to major in accountant.      Diet - regular, tries to limit processed sugars      Exercise - started basketball, swimming    Review of Systems  Constitutional: Negative for fever, chills, activity change, appetite change, fatigue and unexpected weight change.  HENT: Positive for congestion, ear pain, postnasal drip, rhinorrhea and sinus pressure. Negative for sore throat.   Eyes: Negative for visual disturbance.  Respiratory: Negative for cough and shortness of breath.   Cardiovascular: Negative for chest pain, palpitations and leg swelling.  Gastrointestinal: Negative for nausea, vomiting, abdominal pain, diarrhea, constipation and abdominal distention.  Genitourinary: Negative for dysuria, urgency and difficulty urinating.  Musculoskeletal: Negative for arthralgias and gait problem.  Skin: Negative for color change and rash.  Neurological: Positive for headaches. Negative for tremors and weakness.  Hematological: Negative for adenopathy.  Psychiatric/Behavioral: Negative for sleep disturbance and dysphoric mood. The patient is nervous/anxious.        Objective:    BP 120/90 mmHg  Pulse 120  Temp(Src) 98.4 F (36.9 C) (Oral)  Resp 16  Ht 6' (1.829 m)  Wt 414 lb (187.789 kg)  BMI 56.14 kg/m2  SpO2 98% Physical Exam  Constitutional: He is oriented to person, place, and time. He appears well-developed and well-nourished. No distress.  HENT:  Head: Normocephalic and atraumatic.  Right Ear: External ear normal. Tympanic membrane is erythematous and bulging. A middle ear effusion is present.  Left Ear: External ear  normal. A middle ear effusion is present.  Nose: Nose normal.  Mouth/Throat: Oropharynx is clear and moist. No oropharyngeal exudate.  Eyes: Conjunctivae and EOM are normal. Pupils are equal, round, and reactive to light. Right eye exhibits no discharge. Left eye exhibits no discharge. No scleral icterus.  Neck: Normal range of motion. Neck supple. No tracheal deviation present. No thyromegaly present.  Cardiovascular: Normal rate, regular rhythm and normal heart sounds.  Exam reveals no gallop and no friction rub.   No murmur heard. Pulmonary/Chest: Effort normal and breath sounds normal. No accessory muscle usage. No tachypnea. No respiratory distress. He has no decreased breath sounds. He has no wheezes. He has no rhonchi. He has no rales. He exhibits no tenderness.  Musculoskeletal: Normal range of motion. He exhibits no edema.  Lymphadenopathy:    He has no cervical adenopathy.  Neurological: He is alert and oriented to person, place, and time. No cranial nerve deficit. Coordination normal.  Skin: Skin is warm and dry. No rash noted. He is not diaphoretic. No erythema. No pallor.  Psychiatric: He has a normal mood and affect. His behavior is normal. Judgment and thought content normal.          Assessment & Plan:   Problem List Items Addressed This Visit      Unprioritized   Headache - Primary    Recent headache likely related to sinus infection and/or right otitis media. Will continue prn Tylenol and Ibuprofen. Start Augmentin if symptoms not improving over next 1-2 days. Email or call with update next week. Recheck 4 weeks.      Otitis media    Right OM on exam. Discussed pain management with prn Tylenol or Ibuprofen. Start Augmentin if symptoms not improving. Email with update.      Relevant Medications   amoxicillin-clavulanate (AUGMENTIN) 875-125 MG tablet   Severe obesity (BMI >= 40) (HCC)    Encouraged follow up with bariatric surgery as scheduled.           Return in about 4 weeks (around 12/01/2015), or if symptoms worsen or fail to improve, for Recheck.

## 2015-11-03 NOTE — Patient Instructions (Signed)
If symptoms of headache, congestion and ear pain persistent, then start Augmentin.  Email with update.

## 2015-11-03 NOTE — Assessment & Plan Note (Signed)
Recent headache likely related to sinus infection and/or right otitis media. Will continue prn Tylenol and Ibuprofen. Start Augmentin if symptoms not improving over next 1-2 days. Email or call with update next week. Recheck 4 weeks.

## 2015-11-03 NOTE — Progress Notes (Signed)
Pre visit review using our clinic review tool, if applicable. No additional management support is needed unless otherwise documented below in the visit note. 

## 2015-11-03 NOTE — Assessment & Plan Note (Signed)
Right OM on exam. Discussed pain management with prn Tylenol or Ibuprofen. Start Augmentin if symptoms not improving. Email with update.

## 2015-11-16 ENCOUNTER — Ambulatory Visit: Payer: BLUE CROSS/BLUE SHIELD | Admitting: Internal Medicine

## 2015-11-20 ENCOUNTER — Ambulatory Visit: Payer: BLUE CROSS/BLUE SHIELD | Admitting: Internal Medicine

## 2016-01-08 ENCOUNTER — Ambulatory Visit: Payer: BLUE CROSS/BLUE SHIELD | Admitting: Neurology

## 2016-01-12 ENCOUNTER — Other Ambulatory Visit: Payer: Self-pay | Admitting: Specialist

## 2016-01-18 ENCOUNTER — Telehealth: Payer: Self-pay | Admitting: Cardiovascular Disease

## 2016-01-18 ENCOUNTER — Telehealth: Payer: Self-pay | Admitting: Family Medicine

## 2016-01-18 NOTE — Telephone Encounter (Signed)
Pt mother states Alcus DadMargaret Cope, (fax, [phone]) case worker, needs clearance letter for pt to have bypass surgery. Please call.

## 2016-01-18 NOTE — Telephone Encounter (Signed)
Spoke w/ pt's mother. Pt had an abnormal EKG @ PCP's office recently.  She is requesting cardiac clearance for pt to proceed w/ bariatric surgery.  Advised her that we have not received cardiac clearance request indicating procedure to be performed, at what facility it will performed, w/ what MD and any med recommendations.  Advised her that I will need this request before I can fax pt's records. She will contact case worker and have this sent over.  Asked her to call back if we can be of further assistance.

## 2016-01-18 NOTE — Telephone Encounter (Signed)
Pt mother said that her son use to go to [facility] and that he is fixing to have bariatric surgery and they need 2013 year  of his weight. Asking that you mail this to her at [phone] [place]  [phone].

## 2016-01-19 NOTE — Telephone Encounter (Signed)
PT MOTHER SAID THAT SHE WAS RETURIONG A CALL AND WE WENT INTO THE [facility] AND GAVE HER WHAT THE PATIENT NEEDED. TO COME BY AND PICK IT UP Friday OR Monday. DONE

## 2016-01-26 ENCOUNTER — Ambulatory Visit
Admission: RE | Admit: 2016-01-26 | Discharge: 2016-01-26 | Disposition: A | Discharge status: Home / Self Care | Payer: BLUE CROSS/BLUE SHIELD | Source: Ambulatory Visit | Attending: Specialist | Admitting: Specialist

## 2016-01-26 DIAGNOSIS — Z01818 Encounter for other preprocedural examination: Secondary | ICD-10-CM | POA: Diagnosis present

## 2016-01-26 DIAGNOSIS — R932 Abnormal findings on diagnostic imaging of liver and biliary tract: Secondary | ICD-10-CM | POA: Diagnosis not present

## 2016-02-20 ENCOUNTER — Other Ambulatory Visit: Payer: Self-pay | Admitting: Internal Medicine

## 2016-02-21 NOTE — Telephone Encounter (Signed)
Refill request for Clonazepam, last seen FEB 2017, last filled Jan 2017.  Please advise.

## 2016-03-04 ENCOUNTER — Other Ambulatory Visit: Payer: Self-pay | Admitting: Neurology

## 2016-03-08 ENCOUNTER — Other Ambulatory Visit: Payer: Self-pay | Admitting: Internal Medicine

## 2016-04-08 ENCOUNTER — Telehealth: Payer: Self-pay | Admitting: *Deleted

## 2016-04-08 NOTE — Telephone Encounter (Signed)
Patient will be seen by Dr Darrick Huntsman for further care, mother is pt of Dr. Darrick Huntsman

## 2016-04-23 ENCOUNTER — Ambulatory Visit (INDEPENDENT_AMBULATORY_CARE_PROVIDER_SITE_OTHER): Payer: BLUE CROSS/BLUE SHIELD | Admitting: Neurology

## 2016-04-23 ENCOUNTER — Encounter: Payer: Self-pay | Admitting: Neurology

## 2016-04-23 VITALS — BP 130/82 | HR 102 | Ht 72.0 in | Wt >= 6400 oz

## 2016-04-23 DIAGNOSIS — R202 Paresthesia of skin: Secondary | ICD-10-CM | POA: Diagnosis not present

## 2016-04-23 DIAGNOSIS — G43009 Migraine without aura, not intractable, without status migrainosus: Secondary | ICD-10-CM

## 2016-04-23 DIAGNOSIS — M79603 Pain in arm, unspecified: Secondary | ICD-10-CM

## 2016-04-23 DIAGNOSIS — M79602 Pain in left arm: Secondary | ICD-10-CM

## 2016-04-23 DIAGNOSIS — M79601 Pain in right arm: Secondary | ICD-10-CM

## 2016-04-23 MED ORDER — WRIST BRACE MISC: 1.0000 | 0 refills | Status: DC

## 2016-04-23 MED ORDER — TOPIRAMATE ER 50 MG PO CAP24: 150.0000 mg | ORAL_CAPSULE | Freq: Every day | ORAL | 5 refills | Status: DC

## 2016-04-23 NOTE — Patient Instructions (Signed)
1.  The numbness and tingling may be side effect of Topamax or it could be carpal tunnel sydrome.  We will stop topamax and switch to extended release form Trokendi XR  capsules.  Take 3 capsules at bedtime.  People less likely have numbness and tingling with the extended release form.  Also, we will provide you a prescription for wrist splints   Contact me with any questions or concerns. 2.  Otherwise, follow up in 6 months.

## 2016-04-23 NOTE — Progress Notes (Signed)
NEUROLOGY FOLLOW UP OFFICE NOTE  Larry Rogers [id number]  HISTORY OF PRESENT ILLNESS: Larry Rogers is an 20 year old right-handed male with hypertension and anxiety who follows up for migraine.  UPDATE: Headaches are well-controlled. Intensity:  4.5/10 Duration:  2 hours Frequency:  twice a month Current abortive medication:  sometimes Tylenol, but usually none Antihypertensive medications:  none Antidepressant medications:  Sertraline 50mg  Anticonvulsant medications:  topramate 150mg  Vitamins/Herbal/Supplements:  none Other therapy:  none  For the past 2 months, he has had numbness and tingling just before he falls asleep in bed.  He feels it in his hands and wrists, as well as his forehead.  It occurs about twice a week.  Sometimes his thumbs will twitch.  There is no pain or associated headache.  HISTORY: Onset:  5 months ago. Location:  Right temporal/parietal region Quality:  Sharp/stabbing Initial Intensity:  7/10; November 5/10 Aura:  no Prodrome:  no Associated symptoms:  Right eye tears and twitches, dilated right pupil.  Mild photophobia.  He denies double vision.  He has longstanding history of seeing floaters. Initial Duration:  Off and on throughout the day; November to 2 hours Initial Frequency:  Daily.  Goes to sleep and wakes up with it.; November 10 headache days per month. Relieving factors:  Laying down and napping Activity:  Able to function.    Past abortive medication:  Tylenol, Excedrin, Advil, Ibuprofen (ineffective)  He had a CT of the head without contrast performed on 02/12/15 for the headaches, as well as reported left-sided numbness.  It was negative.  MRI of brain with and without contrast from 03/23/15 was normal.    PAST MEDICAL HISTORY: Past Medical History:  Diagnosis Date  . Acne    [facility]  . Anxiety   . Anxiety   . Asthma    seasonal allergies, Dx age 60  . GERD (gastroesophageal reflux disease)   . Headache   .  Hypertension 2012   Dr. Kathrene Alu    MEDICATIONS: Current Outpatient Prescriptions on File Prior to Visit  Medication Sig Dispense Refill  . cetirizine (ZYRTEC) 10 MG tablet TAKE 1 TABLET (10 MG) BY ORAL ROUTE ONCE DAILY  4  . clonazePAM (KLONOPIN) 0.5 MG tablet TAKE 1 TABLET BY MOUTH 3 TIMES A DAY AS NEEDED 90 tablet 1  . clotrimazole (LOTRIMIN) 1 % cream Apply 1 application topically 2 (two) times daily. 30 g 0  . fluticasone (FLONASE) 50 MCG/ACT nasal spray INHALE 2 SPRAYS (100 MCG) IN EACH NOSTRIL BY INTRANASAL ROUTE ONCE DAILY AS NEEDED.  4  . gentamicin ointment (GARAMYCIN) 0.1 % Apply 1 application topically 3 (three) times daily. 15 g 0  . lisinopril (PRINIVIL,ZESTRIL) 10 MG tablet TAKE 1 TABLET BY MOUTH EVERY DAY 90 tablet 3  . montelukast (SINGULAIR) 10 MG tablet Take 1 tablet (10 mg total) by mouth at bedtime. 90 tablet 4  . NEXIUM 24HR 20 MG capsule TAKE 2 CAPSULES BY MOUTH TWICE A DAY BEFORE MEALS (INSURANCE REQUIRES OTC) 126 capsule 2  . ondansetron (ZOFRAN) 4 MG tablet Take 1 tablet (4 mg total) by mouth every 8 (eight) hours as needed for nausea or vomiting. 20 tablet 0  . sertraline (ZOLOFT) 50 MG tablet TAKE 2 TABLETS (100 MG TOTAL) BY MOUTH DAILY. 60 tablet 3  . traZODone (DESYREL) 50 MG tablet Take 0.5-1 tablets (25-50 mg total) by mouth at bedtime as needed for sleep. 30 tablet 3   No current facility-administered medications on file  prior to visit.     ALLERGIES: No Known Allergies  FAMILY HISTORY: Family History  Problem Relation Age of Onset  . Hypertension Father   . Heart disease Paternal Grandfather   . Hypertension Paternal Grandfather   . Aortic aneurysm Paternal Grandfather   . Diabetes Mother   . Diabetes Maternal Uncle     x2  . Colon cancer Neg Hx   . Colon polyps Neg Hx   . Esophageal cancer Neg Hx   . Kidney disease Neg Hx   . Gallbladder disease Neg Hx     SOCIAL HISTORY: Social History   Social History  . Marital status: Single     Spouse name: N/A  . Number of children: 0  . Years of education: N/A   Occupational History  . Student     [employer]   Social History Main Topics  . Smoking status: Never Smoker  . Smokeless tobacco: Never Used  . Alcohol use No  . Drug use: No  . Sexual activity: No   Other Topics Concern  . Not on file   Social History Narrative   Lives in [place] family. Pets - dog and cat.      School - [employer], rising senior. Plans to major in accountant.      Diet - regular, tries to limit processed sugars      Exercise - started basketball, swimming    REVIEW OF SYSTEMS: Constitutional: No fevers, chills, or sweats, no generalized fatigue, change in appetite Eyes: No visual changes, double vision, eye pain Ear, nose and throat: No hearing loss, ear pain, nasal congestion, sore throat Cardiovascular: No chest pain, palpitations Respiratory:  No shortness of breath at rest or with exertion, wheezes GastrointestinaI: No nausea, vomiting, diarrhea, abdominal pain, fecal incontinence Genitourinary:  No dysuria, urinary retention or frequency Musculoskeletal:  No neck pain, back pain Integumentary: No rash, pruritus, skin lesions Neurological: as above Psychiatric: No depression, insomnia, anxiety Endocrine: No palpitations, fatigue, diaphoresis, mood swings, change in appetite, change in weight, increased thirst Hematologic/Lymphatic:  No purpura, petechiae. Allergic/Immunologic: no itchy/runny eyes, nasal congestion, recent allergic reactions, rashes  PHYSICAL EXAM: Vitals:   04/23/16 1059  BP: 130/82  Pulse: (!) 102   General: No acute distress.  Patient appears well-groomed. Head:  Normocephalic/atraumatic Eyes:  Fundi examined but not visualized Neck: supple, no paraspinal tenderness, full range of motion Heart:  Regular rate and rhythm Lungs:  Clear to auscultation bilaterally Back: No paraspinal tenderness Neurological Exam: alert and oriented to person, place, and  time. Attention span and concentration intact, recent and remote memory intact, fund of knowledge intact.  Speech fluent and not dysarthric, language intact.  CN II-XII intact. Bulk and tone normal, muscle strength 5/5 throughout.  Sensation to light touch, temperature and vibration intact.  Deep tendon reflexes 2+ throughout.  Finger to nose and heel to shin testing intact.  Gait normal  IMPRESSION: 1.  Migraine without aura, controlled 2.  Paresthesias, involving hands and forehead.  Etiology not clear.  It may be side effect of topiramate, however he has been on it for quite some time already.  Also, consider carpal tunnel syndrome, although that wouldn't explain numbness on forehead. 3.  Morbid obesity  PLAN: 1.  Will switch from topiramate 150mg  at bedtime to Trokendi XR 150mg  at bedtime as the extended release form of topiramate less likely causes paresthesia 2.  Will also prescribe bilateral wrist splints to wear at night. 3.  If symptoms worsen, he  will contact us and we may consider NCV-EMG. 4.  Otherwise, follow up in 6 months. 5.  Weight loss  15 minutes spent face to face with patient, over 50% spent counseling.  Shon Millet, DO  CC:  Duncan Dull, MD

## 2016-04-23 NOTE — Progress Notes (Signed)
Chart forwarded.  

## 2016-04-25 ENCOUNTER — Telehealth: Payer: Self-pay

## 2016-04-25 NOTE — Telephone Encounter (Signed)
Opened in error

## 2016-05-02 ENCOUNTER — Other Ambulatory Visit: Payer: Self-pay

## 2016-05-02 MED ORDER — SERTRALINE HCL 50 MG PO TABS: ORAL_TABLET | ORAL | 3 refills | Status: DC

## 2016-05-03 ENCOUNTER — Other Ambulatory Visit: Payer: Self-pay

## 2016-05-03 MED ORDER — SERTRALINE HCL 50 MG PO TABS: ORAL_TABLET | ORAL | 3 refills | Status: DC

## 2016-05-15 ENCOUNTER — Encounter: Payer: Self-pay | Admitting: Internal Medicine

## 2016-05-16 ENCOUNTER — Other Ambulatory Visit: Payer: Self-pay | Admitting: Internal Medicine

## 2016-05-16 MED ORDER — LISINOPRIL 10 MG PO TABS: 10.0000 mg | ORAL_TABLET | Freq: Every day | ORAL | 0 refills | Status: DC

## 2016-05-16 MED ORDER — CLONAZEPAM 0.5 MG PO TABS: 0.5000 mg | ORAL_TABLET | Freq: Three times a day (TID) | ORAL | 0 refills | Status: DC | PRN

## 2016-05-16 MED ORDER — SERTRALINE HCL 100 MG PO TABS: ORAL_TABLET | ORAL | 0 refills | Status: DC

## 2016-05-16 NOTE — Telephone Encounter (Signed)
Patient is new to you, former [facility] patient, Please advise refills, thanks

## 2016-05-16 NOTE — Telephone Encounter (Signed)
Sent mychart to patient as he has been communicating that way with me today, thanks

## 2016-05-16 NOTE — Telephone Encounter (Signed)
Pt and his father went to have the following refills and they could not be refilled because pt is a former [name] pt. He is now a [name].   clonazePAM (KLONOPIN) 0.5 MG tablet lisinopril (PRINIVIL,ZESTRIL) 10 MG tablet sertraline (ZOLOFT) 50 MG tablet, pt is requesting 100 mg instead of 50, because he is taking 2 at a time.

## 2016-05-16 NOTE — Telephone Encounter (Signed)
Patient is very overdue for follow up given his long term use of controlled substances.  He is new to me I hve refilled,  But he needs to schedule appt in September  Or there will be no refills on the clonazepam

## 2016-05-31 ENCOUNTER — Other Ambulatory Visit: Payer: Self-pay | Admitting: Neurology

## 2016-05-31 MED ORDER — TOPIRAMATE ER 50 MG PO CAP24: 150.0000 mg | ORAL_CAPSULE | Freq: Every day | ORAL | 5 refills | Status: DC

## 2016-05-31 NOTE — Telephone Encounter (Signed)
1.  Will switch from topiramate 150mg  at bedtime to Trokendi XR 150mg  at bedtime as the extended release form of topiramate less likely causes paresthesia

## 2016-06-04 ENCOUNTER — Telehealth: Payer: Self-pay | Admitting: Neurology

## 2016-06-04 NOTE — Telephone Encounter (Signed)
Larry Rogers 1997/04/15Topiramate could be refilled. The other medication is too expensive and insurance will not pay. His#  [phone]. He uses [facility] [address] in [place].

## 2016-06-05 ENCOUNTER — Telehealth: Payer: Self-pay | Admitting: Cardiovascular Disease

## 2016-06-05 ENCOUNTER — Ambulatory Visit (INDEPENDENT_AMBULATORY_CARE_PROVIDER_SITE_OTHER): Payer: BLUE CROSS/BLUE SHIELD | Admitting: Internal Medicine

## 2016-06-05 ENCOUNTER — Encounter: Payer: Self-pay | Admitting: Internal Medicine

## 2016-06-05 DIAGNOSIS — Z23 Encounter for immunization: Secondary | ICD-10-CM | POA: Diagnosis not present

## 2016-06-05 DIAGNOSIS — R230 Cyanosis: Secondary | ICD-10-CM

## 2016-06-05 DIAGNOSIS — F411 Generalized anxiety disorder: Secondary | ICD-10-CM | POA: Diagnosis not present

## 2016-06-05 MED ORDER — TOPIRAMATE 100 MG PO TABS: 150.0000 mg | ORAL_TABLET | Freq: Every day | ORAL | 3 refills | Status: DC

## 2016-06-05 MED ORDER — CLONAZEPAM 0.5 MG PO TABS: 0.5000 mg | ORAL_TABLET | Freq: Every evening | ORAL | 5 refills | Status: DC | PRN

## 2016-06-05 NOTE — Telephone Encounter (Signed)
Received ROI from [facility]. They request cardiac clearance for surgery prep. DOS or procedure is not indicated.   Please route clearance to [phone].

## 2016-06-05 NOTE — Telephone Encounter (Signed)
Okay to go back to topamax

## 2016-06-05 NOTE — Progress Notes (Signed)
Subjective:  Patient ID: Larry Rogers, male    DOB: [birth year]  Age: 20 y.o. MRN: [id number]  CC: Diagnoses of Encounter for immunization, Morbid obesity due to excess calories (HCC), GAD (generalized anxiety disorder), and Bluish skin discoloration were pertinent to this visit.  HPI Larry Rogers presents for transition of care from Dr Larry Rogers. He is referred by both parents are my patients (Larry Rogers and Larry Rogers)  Cc: has noticed episodes of change in color of Bilateral legs and hands,  Have appeared bluish tinted , has noticed the discoloration  Both at night when lying down and when standing in the shower. .  No history of OSA by Sleep Study last October   1) morbid obesity: still gaining weight. BMI now 60  With 8 lb weight gain since 8/8 and 50 lb wt gain since December,  85 lbs since Nov 2015 (365 is personal best).  Has been referred to Bariatric Surgery and is contemplating a  duodenal switch as the planned bariatric surgery for later this year, 2 week liquid diet planned prior to surgery.  Not exercising daily due to lack of motivation .  Feelsthat having the surgery will help motivate him to exercise daily once he starts losing the weight/  .  Has been reading online  And you tube videos.     2) : Had EGD in August  in prep for surgery   3) Migraines:  Sees Larry Rogers , headache prevention regimen includes  topamax qhs.  Averaging 2-3 month , previously daily   4) GAD:  MANAGED WITH CLONAZEPAM that he takes ONCE DAILY IN EVENING ALONG WITH trazodone and  ZOLOFT.   Outpatient Medications Prior to Visit  Medication Sig Dispense Refill  . cetirizine (ZYRTEC) 10 MG tablet TAKE 1 TABLET (10 MG) BY ORAL ROUTE ONCE DAILY  4  . clotrimazole (LOTRIMIN) 1 % cream Apply 1 application topically 2 (two) times daily. 30 g 0  . fluticasone (FLONASE) 50 MCG/ACT nasal spray INHALE 2 SPRAYS (100 MCG) IN EACH NOSTRIL BY INTRANASAL ROUTE ONCE DAILY AS NEEDED.  4  . gentamicin ointment (GARAMYCIN) 0.1 %  Apply 1 application topically 3 (three) times daily. 15 g 0  . lisinopril (PRINIVIL,ZESTRIL) 10 MG tablet Take 1 tablet (10 mg total) by mouth daily. 90 tablet 0  . Misc. Devices (WRIST BRACE) MISC 1 each by Does not apply route as directed. Bilateral wrist splints 2 each 0  . montelukast (SINGULAIR) 10 MG tablet Take 1 tablet (10 mg total) by mouth at bedtime. 90 tablet 4  . NEXIUM 24HR 20 MG capsule TAKE 2 CAPSULES BY MOUTH TWICE A DAY BEFORE MEALS (INSURANCE REQUIRES OTC) 126 capsule 2  . ondansetron (ZOFRAN) 4 MG tablet Take 1 tablet (4 mg total) by mouth every 8 (eight) hours as needed for nausea or vomiting. 20 tablet 0  . sertraline (ZOLOFT) 100 MG tablet TAKE 2 TABLETS (100 MG TOTAL) BY MOUTH DAILY. 90 tablet 0  . topiramate (TOPAMAX) 100 MG tablet Take 1.5 tablets (150 mg total) by mouth at bedtime. 45 tablet 3  . Topiramate ER 50 MG CP24 Take 150 mg by mouth at bedtime. 90 capsule 5  . traZODone (DESYREL) 50 MG tablet Take 0.5-1 tablets (25-50 mg total) by mouth at bedtime as needed for sleep. 30 tablet 3  . clonazePAM (KLONOPIN) 0.5 MG tablet Take 1 tablet (0.5 mg total) by mouth 3 (three) times daily as needed. 90 tablet 0   No facility-administered medications prior  to visit.     Review of Systems;  Patient denies headache, fevers, malaise, unintentional weight loss, skin rash, eye pain, sinus congestion and sinus pain, sore throat, dysphagia,  hemoptysis , cough, dyspnea, wheezing, chest pain, palpitations, orthopnea, edema, abdominal pain, nausea, melena, diarrhea, constipation, flank pain, dysuria, hematuria, urinary  Frequency, nocturia, numbness, tingling, seizures,  Focal weakness, Loss of consciousness,  Tremor, insomnia, depression, anxiety, and suicidal ideation.      Objective:  BP 102/80   Pulse (!) 121   Temp 98 F (36.7 C) (Oral)   Ht 6' (1.829 m)   Wt (!) 452 lb 4 oz (205.1 kg)   SpO2 97%   BMI 61.34 kg/m   BP Readings from Last 3 Encounters:  06/05/16  102/80  04/23/16 130/82  11/03/15 120/90    Wt Readings from Last 3 Encounters:  06/05/16 (!) 452 lb 4 oz (205.1 kg)  04/23/16 (!) 444 lb (201.4 kg)  11/03/15 (!) 414 lb (187.8 kg) (>99 %, Z > 2.33)*   * Growth percentiles are based on CDC 2-20 Years data.    General appearance: alert, cooperative and appears stated age Ears: normal TM's and external ear canals both ears Throat: lips, mucosa, and tongue normal; teeth and gums normal Neck: no adenopathy, no carotid bruit, supple, symmetrical, trachea midline and thyroid not enlarged, symmetric, no tenderness/mass/nodules Back: symmetric, no curvature. ROM normal. No CVA tenderness. Lungs: clear to auscultation bilaterally Heart: regular rate and rhythm, S1, S2 normal, no murmur, click, rub or gallop Abdomen: soft, non-tender; bowel sounds normal; no masses,  no organomegaly Pulses: 2+ and symmetric Skin: Skin color, texture, turgor normal. No rashes or lesions Lymph nodes: Cervical, supraclavicular, and axillary nodes normal.  Lab Results  Component Value Date   HGBA1C 5.0 06/21/2014    Lab Results  Component Value Date   CREATININE 0.9 06/09/2015   CREATININE 0.89 03/23/2015   CREATININE 0.96 01/14/2015    Lab Results  Component Value Date   WBC 7.5 06/09/2015   HGB 15.8 06/09/2015   HCT 46 06/09/2015   PLT 284 06/09/2015   GLUCOSE 92 01/14/2015   CHOL 137 06/09/2015   TRIG 177 (A) 06/09/2015   HDL 29 (A) 06/09/2015   LDLCALC 73 06/09/2015   ALT 41 (A) 06/09/2015   AST 18 06/09/2015   NA 142 06/09/2015   K 4.2 06/09/2015   CL 107 01/14/2015   CREATININE 0.9 06/09/2015   BUN 14 06/09/2015   CO2 24 01/14/2015   TSH 2.73 09/02/2015   HGBA1C 5.0 06/21/2014   MICROALBUR 0.84 03/04/2013    Dg Chest 2 View  Result Date: 01/26/2016 CLINICAL DATA:  Preoperative evaluation for bariatric surgery ; morbid obesity EXAM: CHEST  2 VIEW COMPARISON:  Jan 31, 2006 FINDINGS: Lungs are clear. Heart size and pulmonary  vascularity are normal. No adenopathy. No bone lesions. IMPRESSION: No edema or consolidation. Electronically Signed   By: Larry Rogers M.D.   On: 01/26/2016 10:05   US Abdomen Limited Ruq  Result Date: 01/26/2016 CLINICAL DATA:  Morbid obesity; preoperative bariatric surgery EXAM: US ABDOMEN LIMITED - RIGHT UPPER QUADRANT COMPARISON:  None. FINDINGS: Gallbladder: No gallstones or wall thickening visualized. There is no pericholecystic fluid. No sonographic Murphy sign noted by sonographer. Common bile duct: Diameter: 3 mm. No intrahepatic or extrahepatic biliary duct dilatation. Liver: No focal lesion identified. Liver echogenicity is diffusely increased. IMPRESSION: Diffuse increase in liver echogenicity, a finding most likely indicative of hepatic steatosis. While no focal  liver lesions are identified, it must be cautioned that sensitivity of ultrasound for focal liver lesions is diminished in this circumstance. Study otherwise unremarkable. Electronically Signed   By: Larry BangWilliam  Woodruff Rogers M.D.   On: 01/26/2016 10:03    Assessment & Plan:   Problem List Items Addressed This Visit    Morbid obesity (HCC)    Encouraged him to stat exercising now and follow up with bariatric surgery as scheduled. To start his liquid diet.  Letter of support written       GAD (generalized anxiety disorder)    Symptoms improved with Sertraline. Will continue.  He is using clonazepam once daily.  Rx revised to reflect current use.  Continue trazodone for insomnia.       Bluish skin discoloration    Patient was placed in supine position for 15 mintues  to assess for hypoventilation induced hypoxia and cyanosis and his stas remain 96% and toes did not turn cyanotic.  He has palpable DP pulses, and cap refill is < 3 sec.  Reassurance given. .        Other Visit Diagnoses    Encounter for immunization       Relevant Orders   Flu Vaccine QUAD 36+ mos IM (Completed)     A total of 25 minutes of face to  face time was spent with patient more than half of which was spent in counselling about the above mentioned conditions  and coordination of care  I have changed Mr. Tawana ScaleWilson's clonazePAM. I am also having him maintain his montelukast, ondansetron, cetirizine, fluticasone, NEXIUM 24HR, traZODone, gentamicin ointment, clotrimazole, Wrist Brace, sertraline, lisinopril, Topiramate ER, and topiramate.  Meds ordered this encounter  Medications  . clonazePAM (KLONOPIN) 0.5 MG tablet    Sig: Take 1 tablet (0.5 mg total) by mouth at bedtime as needed.    Dispense:  30 tablet    Refill:  5    MAY REFILL ON OR AFTER NOV 29    Medications Discontinued During This Encounter  Medication Reason  . clonazePAM (KLONOPIN) 0.5 MG tablet Reorder    Follow-up: Return in about 6 months (around 12/03/2016).   Sherlene ShamsULLO,  L, MD

## 2016-06-05 NOTE — Telephone Encounter (Signed)
Ok to go back to [employer]?

## 2016-06-05 NOTE — Telephone Encounter (Signed)
Acceptable risk for bariatric surgery No further testing needed

## 2016-06-05 NOTE — Progress Notes (Signed)
Pre visit review using our clinic review tool, if applicable. No additional management support is needed unless otherwise documented below in the visit note. 

## 2016-06-05 NOTE — Patient Instructions (Signed)
It was very nice to meet you!  Your circulation is fine!  The blue tinge you see occasionally in your feet may be due to venous engorgement which can make the toes look purplish blue.  As long as there is no pain associated with the blueness,  No workup needs to be done   I have given you a refill for the clonazepam that can be requested at your pharmacy on or after Nov 30th.

## 2016-06-06 NOTE — Telephone Encounter (Signed)
Clearance routed through Epic to [facility].

## 2016-06-07 ENCOUNTER — Telehealth: Payer: Self-pay | Admitting: *Deleted

## 2016-06-07 ENCOUNTER — Encounter: Payer: Self-pay | Admitting: *Deleted

## 2016-06-07 NOTE — Telephone Encounter (Signed)
Please advise 

## 2016-06-07 NOTE — Telephone Encounter (Signed)
Patients mother has requested to have Dr. Darrick Huntsmanullo write a statement that patient does not abuse drugs or alcohol, and that pt is a good candidate for bariatric surgery.  Contact mother Karoline Caldwellngie  [phone]

## 2016-06-07 NOTE — Telephone Encounter (Signed)
Patient mother has been notified and patients weight chart has been placed up front to be placed

## 2016-06-07 NOTE — Telephone Encounter (Signed)
Pt mother has requested to have a copy of pt's weight charted from 2012, this is need by his bariatric surgeon Contact [phone]  Please call when ready for pick up

## 2016-06-08 DIAGNOSIS — R230 Cyanosis: Secondary | ICD-10-CM | POA: Insufficient documentation

## 2016-06-08 NOTE — Assessment & Plan Note (Signed)
Symptoms improved with Sertraline. Will continue.  He is using clonazepam once daily.  Rx revised to reflect current use.  Continue trazodone for insomnia.

## 2016-06-08 NOTE — Telephone Encounter (Signed)
Letter was done OTW and sent to patient.  Can print out for mother if needed.

## 2016-06-08 NOTE — Assessment & Plan Note (Signed)
Patient was placed in supine position for 15 mintues  to assess for hypoventilation induced hypoxia and cyanosis and his stas remain 96% and toes did not turn cyanotic.  He has palpable DP pulses, and cap refill is < 3 sec.  Reassurance given. .Marland Kitchen

## 2016-06-08 NOTE — Assessment & Plan Note (Signed)
Encouraged him to stat exercising now and follow up with bariatric surgery as scheduled. To start his liquid diet.  Letter of support written

## 2016-06-25 ENCOUNTER — Encounter: Payer: Self-pay | Admitting: Internal Medicine

## 2016-06-26 ENCOUNTER — Encounter: Payer: Self-pay | Admitting: *Deleted

## 2016-06-26 NOTE — Telephone Encounter (Signed)
Can you Please print this in letter form?.  I can no longer create a letter In his chart and I don't know what the problem is,  No options for writing it  :    I am writing to appeal your decision to deny coverage of [name]'s anticipated bariatric surgery.  This young man has severe obesity, wiith a BMI over 50, despite several years of struggling to lose weight through dietary restrictions and exercise.Marland Kitchen.  He has a significant history of generalized anxiety disorder which makes all appetite suppressants contraindicated.  He suffers from esophagitis secondary to GERD despite maximal doses of medication , and has had a sleep study which although nondiagnostic, is already suggestive of at risk for developing sleep apnea.  His weight has  Contributed  To  bilateral knee  And ankle pain and has been advised to lose 100 lbs or his joints will start to degenerate prematurely.,   It is my opinion that bariatric surgery is medically necessary to prevent further deterioration of this young man's medical condition.  Please reconsider your denial of coverage for this procedure.  Regards,   Larry Rogers , MD

## 2016-06-26 NOTE — Telephone Encounter (Signed)
Letter completed in quick sign

## 2016-06-27 ENCOUNTER — Other Ambulatory Visit: Payer: Self-pay | Admitting: Internal Medicine

## 2016-06-29 ENCOUNTER — Encounter: Payer: Self-pay | Admitting: Internal Medicine

## 2016-07-01 ENCOUNTER — Emergency Department: Payer: BLUE CROSS/BLUE SHIELD

## 2016-07-01 ENCOUNTER — Encounter: Payer: Self-pay | Admitting: Family

## 2016-07-01 ENCOUNTER — Emergency Department
Admission: EM | Admit: 2016-07-01 | Discharge: 2016-07-01 | Disposition: A | Discharge status: Home / Self Care | Payer: BLUE CROSS/BLUE SHIELD | Attending: Emergency Medicine | Admitting: Emergency Medicine

## 2016-07-01 ENCOUNTER — Ambulatory Visit
Admission: RE | Admit: 2016-07-01 | Discharge: 2016-07-01 | Disposition: A | Discharge status: Home / Self Care | Payer: BLUE CROSS/BLUE SHIELD | Source: Ambulatory Visit | Attending: Family | Admitting: Family

## 2016-07-01 ENCOUNTER — Telehealth: Payer: Self-pay | Admitting: *Deleted

## 2016-07-01 ENCOUNTER — Encounter: Payer: Self-pay | Admitting: Internal Medicine

## 2016-07-01 ENCOUNTER — Encounter: Payer: Self-pay | Admitting: Emergency Medicine

## 2016-07-01 ENCOUNTER — Telehealth: Payer: Self-pay | Admitting: Family

## 2016-07-01 ENCOUNTER — Ambulatory Visit (INDEPENDENT_AMBULATORY_CARE_PROVIDER_SITE_OTHER): Payer: BLUE CROSS/BLUE SHIELD | Admitting: Family

## 2016-07-01 VITALS — BP 110/80 | HR 133 | Temp 100.7°F | Wt >= 6400 oz

## 2016-07-01 DIAGNOSIS — R1030 Lower abdominal pain, unspecified: Secondary | ICD-10-CM

## 2016-07-01 DIAGNOSIS — Z7951 Long term (current) use of inhaled steroids: Secondary | ICD-10-CM | POA: Insufficient documentation

## 2016-07-01 DIAGNOSIS — R509 Fever, unspecified: Secondary | ICD-10-CM

## 2016-07-01 DIAGNOSIS — R591 Generalized enlarged lymph nodes: Secondary | ICD-10-CM | POA: Insufficient documentation

## 2016-07-01 DIAGNOSIS — R590 Localized enlarged lymph nodes: Secondary | ICD-10-CM

## 2016-07-01 DIAGNOSIS — R109 Unspecified abdominal pain: Secondary | ICD-10-CM

## 2016-07-01 DIAGNOSIS — I10 Essential (primary) hypertension: Secondary | ICD-10-CM | POA: Diagnosis not present

## 2016-07-01 DIAGNOSIS — Z79899 Other long term (current) drug therapy: Secondary | ICD-10-CM | POA: Insufficient documentation

## 2016-07-01 DIAGNOSIS — J45909 Unspecified asthma, uncomplicated: Secondary | ICD-10-CM | POA: Diagnosis not present

## 2016-07-01 DIAGNOSIS — K760 Fatty (change of) liver, not elsewhere classified: Secondary | ICD-10-CM | POA: Insufficient documentation

## 2016-07-01 DIAGNOSIS — R8290 Unspecified abnormal findings in urine: Secondary | ICD-10-CM

## 2016-07-01 DIAGNOSIS — B349 Viral infection, unspecified: Secondary | ICD-10-CM | POA: Diagnosis not present

## 2016-07-01 DIAGNOSIS — R599 Enlarged lymph nodes, unspecified: Secondary | ICD-10-CM | POA: Insufficient documentation

## 2016-07-01 LAB — COMPREHENSIVE METABOLIC PANEL
ALT: 109 U/L — ABNORMAL HIGH (ref 0–53)
AST: 86 U/L — ABNORMAL HIGH (ref 0–37)
Albumin: 3.9 g/dL (ref 3.5–5.2)
Alkaline Phosphatase: 63 U/L (ref 39–117)
BUN: 13 mg/dL (ref 6–23)
CALCIUM: 8.8 mg/dL (ref 8.4–10.5)
CHLORIDE: 107 meq/L (ref 96–112)
CO2: 17 meq/L — AB (ref 19–32)
Creatinine, Ser: 0.95 mg/dL (ref 0.40–1.50)
GFR: 107.19 mL/min (ref >60.00)
Glucose, Bld: 120 mg/dL — ABNORMAL HIGH (ref 70–99)
POTASSIUM: 4.2 meq/L (ref 3.5–5.1)
Sodium: 135 mEq/L (ref 135–145)
Total Bilirubin: 0.8 mg/dL (ref 0.2–1.2)
Total Protein: 6.4 g/dL (ref 6.0–8.3)

## 2016-07-01 LAB — RAPID HIV SCREEN (HIV 1/2 AB+AG)
HIV 1/2 ANTIBODIES: NONREACTIVE
HIV-1 P24 ANTIGEN - HIV24: NONREACTIVE

## 2016-07-01 LAB — POCT URINALYSIS DIPSTICK
GLUCOSE UA: NEGATIVE
Leukocytes, UA: NEGATIVE
NITRITE UA: NEGATIVE
PROTEIN UA: 30
SPEC GRAV UA: 1.015
UROBILINOGEN UA: 1
pH, UA: 6

## 2016-07-01 LAB — CBC WITH DIFFERENTIAL/PLATELET
BASOS ABS: 0 10*3/uL (ref 0–0.1)
BASOS PCT: 0 %
Eosinophils Absolute: 0.1 10*3/uL (ref 0–0.7)
Eosinophils Relative: 2 %
HEMATOCRIT: 42 % (ref 40.0–52.0)
HEMOGLOBIN: 14.6 g/dL (ref 13.0–18.0)
LYMPHS PCT: 11 %
Lymphs Abs: 0.6 10*3/uL — ABNORMAL LOW (ref 1.0–3.6)
MCH: 30.7 pg (ref 26.0–34.0)
MCHC: 34.7 g/dL (ref 32.0–36.0)
MCV: 88.6 fL (ref 80.0–100.0)
MONO ABS: 0.5 10*3/uL (ref 0.2–1.0)
Monocytes Relative: 8 %
NEUTROS ABS: 4.9 10*3/uL (ref 1.4–6.5)
NEUTROS PCT: 79 %
Platelets: 173 10*3/uL (ref 150–440)
RBC: 4.75 MIL/uL (ref 4.40–5.90)
RDW: 14 % (ref 11.5–14.5)
WBC: 6.1 10*3/uL (ref 3.8–10.6)

## 2016-07-01 LAB — POCT INFLUENZA A/B
INFLUENZA A, POC: NEGATIVE
INFLUENZA B, POC: NEGATIVE

## 2016-07-01 LAB — INFLUENZA PANEL BY PCR (TYPE A & B)
H1N1FLUPCR: NOT DETECTED
INFLBPCR: NEGATIVE
Influenza A By PCR: NEGATIVE

## 2016-07-01 LAB — MONONUCLEOSIS SCREEN: MONO SCREEN: NEGATIVE

## 2016-07-01 LAB — LIPASE: LIPASE: 20 U/L (ref 11.0–59.0)

## 2016-07-01 LAB — POCT RAPID STREP A: Streptococcus, Group A Screen (Direct): NEGATIVE

## 2016-07-01 MED ORDER — ACETAMINOPHEN 325 MG PO TABS
650.0000 mg | ORAL_TABLET | Freq: Once | ORAL | Status: AC
  Administered 2016-07-01: 650 mg via ORAL
  Filled 2016-07-01: qty 2

## 2016-07-01 MED ORDER — GI COCKTAIL ~~LOC~~
30.0000 mL | Freq: Once | ORAL | Status: DC
Start: 2016-07-01 — End: 2016-07-01

## 2016-07-01 MED ORDER — SODIUM CHLORIDE 0.9 % IV BOLUS (SEPSIS): 1000.0000 mL | Freq: Once | INTRAVENOUS | Status: DC

## 2016-07-01 MED ORDER — IOPAMIDOL (ISOVUE-300) INJECTION 61%
125.0000 mL | Freq: Once | INTRAVENOUS | Status: AC | PRN
  Administered 2016-07-01: 125 mL via INTRAVENOUS

## 2016-07-01 MED ORDER — SODIUM CHLORIDE 0.9 % IV BOLUS (SEPSIS)
1000.0000 mL | Freq: Once | INTRAVENOUS | Status: AC
  Administered 2016-07-01: 1000 mL via INTRAVENOUS

## 2016-07-01 MED ORDER — GI COCKTAIL ~~LOC~~
ORAL | Status: AC
  Filled 2016-07-01: qty 30

## 2016-07-01 NOTE — ED Notes (Signed)
Spoke with lab regarding add on rapid HIV.  Per lab tech, able to add test onto blood already sent.

## 2016-07-01 NOTE — ED Triage Notes (Signed)
Patient has had abdominal pain x 2 days, diarrhea twice a day x 2 days, vomited x 1 today. Patient arrives to CDU from CT scan per his nurse practitioners advice.

## 2016-07-01 NOTE — Patient Instructions (Signed)
Suspect viral illness.  Plenty of fluids.  Pending imaging and labs.  If symptoms worsen while during this work up period, you must go to ED.

## 2016-07-01 NOTE — Progress Notes (Signed)
Subjective:    Patient ID: Larry Rogers, male    DOB: [birth year], 20 y.o.   MRN: [id number]  CC: Larry Rogers is a 20 y.o. male who presents today for an acute visit.    HPI: Patient presents with chief complaint sinus congestion, nausea, fever 104.2 ( tmax) x 3 days. 'All started at the same time.'  One episode of vomiting today. Endorses lower abdominal pain and radiates to both left and right side; pain has not moved, unchanged. Abdominal pain worse after eats. 3/10.  Endorses dizzy, chills, bodyaches. No syncope.  Headache is throbbing pain.  Noticed drop of blood on toilet paper when he was having a BM 2 days ago.  He took two 500 mg tyelonol prior to coming in. No neck stiffness.   Denies exertional chest pain or pressure, numbness or tingling radiating to left arm or jaw, palpitations, changes in vision, or shortness of breath.        HISTORY:  Past Medical History:  Diagnosis Date  . Acne    Bandana Skin  . Anxiety   . Anxiety   . Asthma    seasonal allergies, Dx age 89  . GERD (gastroesophageal reflux disease)   . Headache   . Hypertension 2012   Dr. Kathrene Alu    Past Surgical History:  Procedure Laterality Date  . ESOPHAGOGASTRODUODENOSCOPY (EGD) WITH PROPOFOL N/A 03/16/2015   Procedure: ESOPHAGOGASTRODUODENOSCOPY (EGD) WITH PROPOFOL;  Surgeon: Louis Meckel, MD;  Location: [facility];  Service: Endoscopy;  Laterality: N/A;  . TONSILLECTOMY     Family History  Problem Relation Age of Onset  . Hypertension Father   . Heart disease Paternal Grandfather   . Hypertension Paternal Grandfather   . Aortic aneurysm Paternal Grandfather   . Diabetes Mother   . Diabetes Maternal Uncle     x2  . Colon cancer Neg Hx   . Colon polyps Neg Hx   . Esophageal cancer Neg Hx   . Kidney disease Neg Hx   . Gallbladder disease Neg Hx     Allergies: Review of patient's allergies indicates no known allergies. No current facility-administered medications on file prior  to visit.    Current Outpatient Prescriptions on File Prior to Visit  Medication Sig Dispense Refill  . cetirizine (ZYRTEC) 10 MG tablet TAKE 1 TABLET (10 MG) BY ORAL ROUTE ONCE DAILY  4  . clonazePAM (KLONOPIN) 0.5 MG tablet Take 1 tablet (0.5 mg total) by mouth at bedtime as needed. 30 tablet 5  . clotrimazole (LOTRIMIN) 1 % cream Apply 1 application topically 2 (two) times daily. 30 g 0  . fluticasone (FLONASE) 50 MCG/ACT nasal spray INHALE 2 SPRAYS (100 MCG) IN EACH NOSTRIL BY INTRANASAL ROUTE ONCE DAILY AS NEEDED.  4  . gentamicin ointment (GARAMYCIN) 0.1 % Apply 1 application topically 3 (three) times daily. 15 g 0  . lisinopril (PRINIVIL,ZESTRIL) 10 MG tablet Take 1 tablet (10 mg total) by mouth daily. 90 tablet 0  . Misc. Devices (WRIST BRACE) MISC 1 each by Does not apply route as directed. Bilateral wrist splints 2 each 0  . montelukast (SINGULAIR) 10 MG tablet Take 1 tablet (10 mg total) by mouth at bedtime. 90 tablet 4  . NEXIUM 24HR 20 MG capsule TAKE 2 CAPSULES BY MOUTH TWICE A DAY BEFORE MEALS (INSURANCE REQUIRES OTC) 126 capsule 2  . ondansetron (ZOFRAN) 4 MG tablet Take 1 tablet (4 mg total) by mouth every 8 (eight) hours as needed for nausea  or vomiting. 20 tablet 0  . sertraline (ZOLOFT) 100 MG tablet TAKE 2 TABLETS (100 MG TOTAL) BY MOUTH DAILY. 180 tablet 0  . topiramate (TOPAMAX) 100 MG tablet Take 1.5 tablets (150 mg total) by mouth at bedtime. 45 tablet 3  . Topiramate ER 50 MG CP24 Take 150 mg by mouth at bedtime. 90 capsule 5  . traZODone (DESYREL) 50 MG tablet Take 0.5-1 tablets (25-50 mg total) by mouth at bedtime as needed for sleep. 30 tablet 3    Social History  Substance Use Topics  . Smoking status: Never Smoker  . Smokeless tobacco: Never Used  . Alcohol use No    Review of Systems  Constitutional: Positive for chills and fever.  HENT: Positive for congestion and sinus pressure. Negative for ear pain, rhinorrhea and sore throat.   Respiratory: Negative  for cough, shortness of breath and wheezing.   Cardiovascular: Negative for chest pain and palpitations.  Gastrointestinal: Positive for nausea and vomiting. Negative for constipation and diarrhea.  Genitourinary: Negative for dysuria.  Musculoskeletal: Positive for myalgias. Negative for neck pain and neck stiffness.  Skin: Negative for rash.  Neurological: Positive for dizziness and headaches.  Hematological: Negative for adenopathy.      Objective:    BP 110/80   Pulse (!) 133   Temp (!) 100.7 F (38.2 C)   Wt (!) 458 lb (207.7 kg)   SpO2 97%   BMI 62.12 kg/m    Physical Exam  Constitutional: He appears well-developed and well-nourished.  HENT:  Head: Normocephalic and atraumatic.  Right Ear: Hearing, tympanic membrane, external ear and ear canal normal. No drainage, swelling or tenderness. Tympanic membrane is not injected, not erythematous and not bulging. No middle ear effusion. No decreased hearing is noted.  Left Ear: Hearing, tympanic membrane, external ear and ear canal normal. No drainage, swelling or tenderness. Tympanic membrane is not injected, not erythematous and not bulging.  No middle ear effusion. No decreased hearing is noted.  Nose: Nose normal. Right sinus exhibits no maxillary sinus tenderness and no frontal sinus tenderness. Left sinus exhibits no maxillary sinus tenderness and no frontal sinus tenderness.  Mouth/Throat: Uvula is midline, oropharynx is clear and moist and mucous membranes are normal. No oropharyngeal exudate, posterior oropharyngeal edema, posterior oropharyngeal erythema or tonsillar abscesses.  Eyes: Conjunctivae, EOM and lids are normal. Pupils are equal, round, and reactive to light. Lids are everted and swept, no foreign bodies found.  Normal fundus bilaterally   Neck: No Brudzinski's sign and no Kernig's sign noted.  Cardiovascular: Regular rhythm and normal heart sounds.   Pulmonary/Chest: Effort normal and breath sounds normal. No  respiratory distress. He has no wheezes. He has no rhonchi. He has no rales.  Abdominal: Soft. Normal appearance and bowel sounds are normal. He exhibits no distension, no fluid wave, no ascites and no mass. There is no tenderness. There is no rigidity, no rebound, no guarding, no CVA tenderness, no tenderness at McBurney's point and negative Murphy's sign.  Lymphadenopathy:       Head (right side): No submental, no submandibular, no tonsillar, no preauricular, no posterior auricular and no occipital adenopathy present.       Head (left side): No submental, no submandibular, no tonsillar, no preauricular, no posterior auricular and no occipital adenopathy present.    He has no cervical adenopathy.  Neurological: He is alert. He has normal strength. No cranial nerve deficit or sensory deficit. He displays a negative Romberg sign.  Reflex Scores:  Bicep reflexes are 2+ on the right side and 2+ on the left side.      Patellar reflexes are 2+ on the right side and 2+ on the left side. Grip equal and strong bilateral upper extremities. Gait strong and steady.   Skin: Skin is warm and dry.  Psychiatric: He has a normal mood and affect. His speech is normal and behavior is normal.  Vitals reviewed.      Assessment & Plan:   1. Lower abdominal pain Nonspecific at this time. No exquisite pain on exam. Differentials include viral enteritis, diverticulitis.   - CBC with Differential/Platelet - Comprehensive metabolic panel - Lipase - CT ABDOMEN PELVIS W WO CONTRAST - POCT urinalysis dipstick  2. Fever, unspecified fever cause Neg flu. Tachycardic. No cardiac symptoms. Pending UA, CT abdomen to identify source.  - POCT Influenza A/B    I am having [name] maintain his montelukast, ondansetron, cetirizine, fluticasone, NEXIUM 24HR, traZODone, gentamicin ointment, clotrimazole, Wrist Brace, lisinopril, Topiramate ER, topiramate, clonazePAM, and sertraline.   No orders of the defined  types were placed in this encounter.   Return precautions given.   Risks, benefits, and alternatives of the medications and treatment plan prescribed today were discussed, and patient expressed understanding.   Education regarding symptom management and diagnosis given to patient on AVS.  Continue to follow with TULLO, Mar DaringERESA L, MD for routine health maintenance.   Daun PeacockAlston C Schuler and I agreed with plan.   Rennie PlowmanMargaret , FNP

## 2016-07-01 NOTE — ED Notes (Signed)
Pt went to X-ray.   

## 2016-07-01 NOTE — Telephone Encounter (Signed)
Lab called & states that they are unable to run his CBC due to clotting.

## 2016-07-01 NOTE — Telephone Encounter (Signed)
Spoke to patient while he was at radiology.  CBC clotted so do not have that information for decision making.   He has elevated ALT and AST in the context of known fatty liver seen on earlier US 01/2016. It does appear that AST, ALT elevation a new finding and however low suspicion it is an acute finding with results from CT.   CT negative for gallstones, appendicitis.    CT does show enlarged upper abdominal lymph nodes and recommend f/u CT.   Patient was still feeling his heart race and my concern was that he needed closer vigilance and likely IV fluids to bring his HR down. Advised to go to ED for observation.  Patient agreed.

## 2016-07-01 NOTE — ED Provider Notes (Addendum)
[facility] Emergency Department Provider Note  ____________________________________________   I have reviewed the triage vital signs and the nursing notes.   HISTORY  Chief Complaint Abdominal Pain    HPI Larry Rogers is a 20 y.o. male who unfortunately suffers from morbid obesity. He states he was in his normal state of health until he suddenly began to feel bad last Friday. This is Monday. He states that he was leaving against. He started having a right-sided abdominal pain which seemed past however afterwards he began having runny nose, slight cough and fevers. He does not have persistent abdominal pain. He has had slight vomiting and slight loose stools as well. He went to the urgent care, they felt his abdomen is nonsurgical but did a CT scan. CT scan shows some nonspecific with adenopathy but no other acute infectious pathology. The patient's TSH was normal 10 months ago. The patient has URI symptoms at this time and feels somewhat dehydrated because he has been febrile for a few days and not having any thing to eat or drink. He is however not persistently vomiting. He does have a slight cough and a slight sore throat. Has had a little bit of sinus drainage as well. He does not at the headache he does not have a stiff neck he does not have dysuria or urinary frequency has no rash she has no testicular pain or swelling. No history of IV drug use.  Past Medical History:  Diagnosis Date  . Acne    [place] [facility]  . Anxiety   . Anxiety   . Asthma    seasonal allergies, Dx age 18  . GERD (gastroesophageal reflux disease)   . Headache   . Hypertension 2012   Dr. Kathrene Alu    Patient Active Problem List   Diagnosis Date Noted  . Lower abdominal pain 07/01/2016  . Bluish skin discoloration 06/08/2016  . Headache 11/03/2015  . Cellulitis 09/06/2015  . Migraine without aura and without status migrainosus, not intractable 08/07/2015  . Snoring 07/04/2015  .  Palpitations 06/14/2015  . Thyroid dysfunction 06/14/2015  . GAD (generalized anxiety disorder) 02/14/2015  . Seasonal allergies 12/20/2014  . Insomnia 08/30/2014  . Asthma, chronic 08/15/2014  . Gastroesophageal reflux disease without esophagitis 08/15/2014  . Essential hypertension, benign 02/25/2013  . Acne 02/25/2013  . Morbid obesity (HCC) 02/25/2013    Past Surgical History:  Procedure Laterality Date  . ESOPHAGOGASTRODUODENOSCOPY (EGD) WITH PROPOFOL N/A 03/16/2015   Procedure: ESOPHAGOGASTRODUODENOSCOPY (EGD) WITH PROPOFOL;  Surgeon: Louis Meckel, MD;  Location: [facility] END[facility];  Service: Endoscopy;  Laterality: N/A;  . TONSILLECTOMY      Prior to Admission medications   Medication Sig Start Date End Date Taking? Authorizing Provider  cetirizine (ZYRTEC) 10 MG tablet TAKE 1 TABLET (10 MG) BY ORAL ROUTE ONCE DAILY 04/07/15   Historical Provider, MD  clonazePAM (KLONOPIN) 0.5 MG tablet Take 1 tablet (0.5 mg total) by mouth at bedtime as needed. 06/05/16   Sherlene Shams, MD  clotrimazole (LOTRIMIN) 1 % cream Apply 1 application topically 2 (two) times daily. 09/06/15   Shelia Media, MD  fluticasone (FLONASE) 50 MCG/ACT nasal spray INHALE 2 SPRAYS (100 MCG) IN EACH NOSTRIL BY INTRANASAL ROUTE ONCE DAILY AS NEEDED. 04/07/15   Historical Provider, MD  gentamicin ointment (GARAMYCIN) 0.1 % Apply 1 application topically 3 (three) times daily. 09/06/15   Shelia Media, MD  lisinopril (PRINIVIL,ZESTRIL) 10 MG tablet Take 1 tablet (10 mg total) by mouth  daily. 05/16/16   Sherlene Shamseresa L Tullo, MD  Misc. Devices (WRIST BRACE) MISC 1 each by Does not apply route as directed. Bilateral wrist splints 04/23/16   Drema DallasAdam R Jaffe, DO  montelukast (SINGULAIR) 10 MG tablet Take 1 tablet (10 mg total) by mouth at bedtime. 08/15/14   Shelia MediaJennifer A Walker, MD  NEXIUM 24HR 20 MG capsule TAKE 2 CAPSULES BY MOUTH TWICE A DAY BEFORE MEALS (INSURANCE REQUIRES OTC) 05/11/15   Louis Meckelobert D Kaplan, MD  ondansetron (ZOFRAN) 4  MG tablet Take 1 tablet (4 mg total) by mouth every 8 (eight) hours as needed for nausea or vomiting. 01/17/15   Carollee Leitzarrie M Doss, NP  sertraline (ZOLOFT) 100 MG tablet TAKE 2 TABLETS (100 MG TOTAL) BY MOUTH DAILY. 06/27/16   Sherlene Shamseresa L Tullo, MD  topiramate (TOPAMAX) 100 MG tablet Take 1.5 tablets (150 mg total) by mouth at bedtime. 06/05/16   Drema DallasAdam R Jaffe, DO  Topiramate ER 50 MG CP24 Take 150 mg by mouth at bedtime. 05/31/16   Drema DallasAdam R Jaffe, DO  traZODone (DESYREL) 50 MG tablet Take 0.5-1 tablets (25-50 mg total) by mouth at bedtime as needed for sleep. 07/18/15   Shelia MediaJennifer A Walker, MD    Allergies Review of patient's allergies indicates no known allergies.  Family History  Problem Relation Age of Onset  . Hypertension Father   . Heart disease Paternal Grandfather   . Hypertension Paternal Grandfather   . Aortic aneurysm Paternal Grandfather   . Diabetes Mother   . Diabetes Maternal Uncle     x2  . Colon cancer Neg Hx   . Colon polyps Neg Hx   . Esophageal cancer Neg Hx   . Kidney disease Neg Hx   . Gallbladder disease Neg Hx     Social History Social History  Substance Use Topics  . Smoking status: Never Smoker  . Smokeless tobacco: Never Used  . Alcohol use No    Review of Systems Constitutional: See history of present illness Eyes: No visual changes. ENT: Positive sore throat. No stiff neck no neck pain Cardiovascular: Denies chest pain. Respiratory: Denies shortness of breath. Positive slight cough Gastrointestinal:   See history of present illness Genitourinary: Negative for dysuria. Musculoskeletal: Negative lower extremity swelling Skin: Negative for rash. Neurological: Negative for severe headaches, focal weakness or numbness. 10-point ROS otherwise negative.  ____________________________________________   PHYSICAL EXAM:  VITAL SIGNS: ED Triage Vitals  Enc Vitals Group     BP 07/01/16 1829 121/64     Pulse Rate 07/01/16 1829 (!) 122     Resp 07/01/16 1829 20      Temp 07/01/16 1829 (!) 100.4 F (38 C)     Temp Source 07/01/16 1829 Oral     SpO2 07/01/16 1829 99 %     Weight 07/01/16 1830 (!) 458 lb (207.7 kg)     Height 07/01/16 1830 6\' 3"  (1.905 m)     Head Circumference --      Peak Flow --      Pain Score 07/01/16 1830 3     Pain Loc --      Pain Edu? --      Excl. in GC? --     Constitutional: Alert and oriented. Well appearing and in no acute distress.Laughing and joking with a remarkably well-appearing Eyes: Conjunctivae are normal. PERRL. EOMI. Head: Atraumatic. Nose: Positive mild congestion/rhinnorhea. Mouth/Throat: Mucous membranes are slightly dry.  Oropharynx non-erythematous. Neck: No stridor.   Nontender with no meningismus Cardiovascular: Normal  rate, regular rhythm. Grossly normal heart sounds.  Good peripheral circulation. Respiratory: Normal respiratory effort.  No retractions. Lungs CTAB. Abdominal: Soft and nontender. No distention. No guarding no rebound Back:  There is no focal tenderness or step off.  there is no midline tenderness there are no lesions noted. there is no CVA tenderness U: Normal external male genitalia no mass or tenderness Musculoskeletal: No lower extremity tenderness, no upper extremity tenderness. No joint effusions, no DVT signs strong distal pulses no edema Neurologic:  Normal speech and language. No gross focal neurologic deficits are appreciated.  Skin:  Skin is warm, dry and intact. No rash noted. Psychiatric: Mood and affect are normal. Speech and behavior are normal.  ____________________________________________   LABS (all labs ordered are listed, but only abnormal results are displayed)  Labs Reviewed  CBC WITH DIFFERENTIAL/PLATELET - Abnormal; Notable for the following:       Result Value   Lymphs Abs 0.6 (*)    All other components within normal limits  INFLUENZA PANEL BY PCR (TYPE A & B, H1N1)   ____________________________________________  EKG  I personally interpreted  any EKGs ordered by me or triage  ____________________________________________  RADIOLOGY  I reviewed any imaging ordered by me or triage that were performed during my shift and, if possible, patient and/or family made aware of any abnormal findings. ____________________________________________   PROCEDURES  Procedure(s) performed: None  Procedures  Critical Care performed: None  ____________________________________________   INITIAL IMPRESSION / ASSESSMENT AND PLAN / ED COURSE  Pertinent labs & imaging results that were available during my care of the patient were reviewed by me and considered in my medical decision making (see chart for details).  Patient with a fever for a few days with URI symptoms mild abdominal discomfort which is not reproducible reassuring CT scan aside from some nonspecific lymphadenopathy which will require outpatient follow-up, we will recheck the rapid influenza as patient has a flulike illness. No check a CBC, we will check a rapid strep we'll give him IV fluid as his physical complaint is that he feels somewhat dehydration with his fever. I'll obtain a chest x-ray and will reassess.   ----------------------------------------- 10:01 PM on 07/01/2016 -----------------------------------------  A she feels 100% better vital signs are stable workup very reassuring here. Did discuss with Dr. Merlene Pulling about the patient's intra-abdominal lymphadenopathy, as well as his slightly low lymphocyte count slightly elevated liver function test all of which conceivably be secondary to a viral process. she will follow-up as an outpatient and doesn't feel any further intervention is required at this time. I do appreciate the consult. Patient is eating and drinking feels 100% better and is requesting discharge. Since of outpatient follow-up and return precautions given and understood.   Clinical Course   ____________________________________________   FINAL CLINICAL  IMPRESSION(S) / ED DIAGNOSES  Final diagnoses:  None      This chart was dictated using voice recognition software.  Despite best efforts to proofread,  errors can occur which can change meaning.      Jeanmarie Plant, MD 07/01/16 1939    Jeanmarie Plant, MD 07/01/16 1610    Jeanmarie Plant, MD 07/01/16 2206

## 2016-07-01 NOTE — Telephone Encounter (Signed)
Called patient and left message to call back.

## 2016-07-01 NOTE — Discharge Instructions (Signed)
At this time, it is most likely that your symptoms are caused by a virus. We do noticed however some lymph nodes in your abdomen which are somewhat unusual on the CT scan performed by your health care provider today. This will require outpatient follow-up and possible repeat CT in a few months. We strongly recommend that you  follow closely with her tomorrow, as an outpatient. Please take Tylenol and Motrin to keep her fever under control, drink plenty of fluid, if he have severe abdominal pain, severe headache, stiff neck, persistent vomiting, difficulty breathing, very high fevers or you feel worse in any way, please return to the emergency department.

## 2016-07-01 NOTE — Telephone Encounter (Signed)
I have tried to reach patient by phone no answer.

## 2016-07-02 LAB — URINALYSIS, MICROSCOPIC ONLY
BACTERIA UA: NONE SEEN [HPF]
CASTS: NONE SEEN [LPF]
Crystals: NONE SEEN [HPF]
RBC / HPF: NONE SEEN RBC/HPF (ref <=2)
Squamous Epithelial / LPF: NONE SEEN [HPF] (ref <=5)
YEAST: NONE SEEN [HPF]

## 2016-07-02 NOTE — Telephone Encounter (Signed)
Was recollected in the hospital

## 2016-07-03 LAB — URINE CULTURE: ORGANISM ID, BACTERIA: NO GROWTH

## 2016-07-10 ENCOUNTER — Telehealth: Payer: Self-pay | Admitting: Internal Medicine

## 2016-07-10 NOTE — Telephone Encounter (Signed)
His recent ER visit for abdominal pain was diagnosed as a viral infection.  CT scan was done and it is recommended that he have a repeat in 3 months to follow up on some lymph nodes that were slightly enlarged.  His repeat liver enzymes are still elevated; is he having any nausea or vomiting?

## 2016-07-10 NOTE — Telephone Encounter (Signed)
Left message to return call 

## 2016-07-11 NOTE — Telephone Encounter (Signed)
Pt called back returning your call. Thank you! °

## 2016-07-12 ENCOUNTER — Telehealth: Payer: Self-pay | Admitting: *Deleted

## 2016-07-12 NOTE — Telephone Encounter (Signed)
Patients mother has been informed of results and the 3 month FU CT.

## 2016-07-12 NOTE — Telephone Encounter (Signed)
Pt mother stated that her son has missed a few calls and a call to discuss the reason for the call Contact Angie [phone]

## 2016-07-12 NOTE — Telephone Encounter (Signed)
left message for patient to return call to office

## 2016-07-15 NOTE — Telephone Encounter (Signed)
Left message to call office

## 2016-07-23 ENCOUNTER — Encounter: Payer: Self-pay | Admitting: *Deleted

## 2016-07-23 NOTE — Telephone Encounter (Signed)
Letter mailed unable to reach patient by phone.

## 2016-07-25 ENCOUNTER — Inpatient Hospital Stay: Payer: BLUE CROSS/BLUE SHIELD

## 2016-07-25 ENCOUNTER — Encounter: Payer: Self-pay | Admitting: Hematology and Oncology

## 2016-07-25 ENCOUNTER — Inpatient Hospital Stay: Payer: BLUE CROSS/BLUE SHIELD | Attending: Hematology and Oncology | Admitting: Hematology and Oncology

## 2016-07-25 DIAGNOSIS — R599 Enlarged lymph nodes, unspecified: Secondary | ICD-10-CM | POA: Insufficient documentation

## 2016-07-25 DIAGNOSIS — K219 Gastro-esophageal reflux disease without esophagitis: Secondary | ICD-10-CM | POA: Insufficient documentation

## 2016-07-25 DIAGNOSIS — K760 Fatty (change of) liver, not elsewhere classified: Secondary | ICD-10-CM

## 2016-07-25 DIAGNOSIS — F419 Anxiety disorder, unspecified: Secondary | ICD-10-CM | POA: Insufficient documentation

## 2016-07-25 DIAGNOSIS — J45909 Unspecified asthma, uncomplicated: Secondary | ICD-10-CM

## 2016-07-25 DIAGNOSIS — R591 Generalized enlarged lymph nodes: Secondary | ICD-10-CM

## 2016-07-25 DIAGNOSIS — Z79899 Other long term (current) drug therapy: Secondary | ICD-10-CM | POA: Insufficient documentation

## 2016-07-25 DIAGNOSIS — I10 Essential (primary) hypertension: Secondary | ICD-10-CM | POA: Diagnosis not present

## 2016-07-25 DIAGNOSIS — F329 Major depressive disorder, single episode, unspecified: Secondary | ICD-10-CM | POA: Insufficient documentation

## 2016-07-25 LAB — COMPREHENSIVE METABOLIC PANEL
ALT: 89 U/L — ABNORMAL HIGH (ref 17–63)
AST: 46 U/L — ABNORMAL HIGH (ref 15–41)
Albumin: 4 g/dL (ref 3.5–5.0)
Alkaline Phosphatase: 59 U/L (ref 38–126)
Anion gap: 5 (ref 5–15)
BUN: 13 mg/dL (ref 6–20)
CO2: 21 mmol/L — ABNORMAL LOW (ref 22–32)
Calcium: 9.1 mg/dL (ref 8.9–10.3)
Chloride: 111 mmol/L (ref 101–111)
Creatinine, Ser: 1.06 mg/dL (ref 0.61–1.24)
GFR calc Af Amer: >60 mL/min (ref >60)
GFR calc non Af Amer: >60 mL/min (ref >60)
Glucose, Bld: 123 mg/dL — ABNORMAL HIGH (ref 65–99)
Potassium: 3.6 mmol/L (ref 3.5–5.1)
Sodium: 137 mmol/L (ref 135–145)
Total Bilirubin: 0.4 mg/dL (ref 0.3–1.2)
Total Protein: 7.2 g/dL (ref 6.5–8.1)

## 2016-07-25 LAB — CBC WITH DIFFERENTIAL/PLATELET
Basophils Absolute: 0 10*3/uL (ref 0–0.1)
Basophils Relative: 1 %
Eosinophils Absolute: 0.2 10*3/uL (ref 0–0.7)
Eosinophils Relative: 3 %
HCT: 44.2 % (ref 40.0–52.0)
Hemoglobin: 15.6 g/dL (ref 13.0–18.0)
Lymphocytes Relative: 36 %
Lymphs Abs: 2.6 10*3/uL (ref 1.0–3.6)
MCH: 30.9 pg (ref 26.0–34.0)
MCHC: 35.3 g/dL (ref 32.0–36.0)
MCV: 87.5 fL (ref 80.0–100.0)
Monocytes Absolute: 0.6 10*3/uL (ref 0.2–1.0)
Monocytes Relative: 8 %
Neutro Abs: 3.7 10*3/uL (ref 1.4–6.5)
Neutrophils Relative %: 52 %
Platelets: 283 10*3/uL (ref 150–440)
RBC: 5.05 MIL/uL (ref 4.40–5.90)
RDW: 14.4 % (ref 11.5–14.5)
WBC: 7.1 10*3/uL (ref 3.8–10.6)

## 2016-07-25 NOTE — Progress Notes (Signed)
Patient here today as new evaluation regarding abdominal adenopathy.  Referred by ED.  Accompanied by his mother today.

## 2016-07-25 NOTE — Progress Notes (Signed)
[facility] day:  07/25/2016  Chief Complaint: Larry Rogers is a 20 y.o. male with abdominal adenopathy who is referred in consultation by Dr. Charlotte Crumb from the emergency room for assessment and management.  HPI:  The patient presented to the emergency room at [facility] on 07/01/2016 with abdominal pain.  He began to have abdominal right-sided abdominal pain on 06/28/2016 which spread across his upper abdomen to the left side.  He had associated vomiting and loose stools.  He had a fever at home to 104.8.  He took Tylenol and ibuprofen.  He also described having a slight cough, sore throat, sinus drainage, and night sweats.  He was seen at urgent care.  CBC revealed a hematocrit of 42.0, hemoglobin 14.6, MCV 88.6, platelets 173,000, and white count 6100 with an ANC of 4900.  CMP included an AST of 86 and ALT 109 (previously 18 and 41, respectively, on 06/09/2015).  H1N1 flu by PCR was negative as well as influenza A and B.  HIV testing was negative.  Abdominal and pelvic CT scan on 07/01/2016 revealed hepatic steatosis. There were mildly enlarged upper abdominal lymph nodes, indeterminate.  Specifically there was a 1.6 cm periancreatic lymph node, a borderline hepatoduodenal ligament lymph node, and a 1.3 cm left periaortic lymph node.  There was no splenomegaly.  CXR was negative.  Symptomatically, he denies any complaints.  His symptoms resolved on 07/04/2016.  He feels "fine".   Past Medical History:  Diagnosis Date  . Acne    [place] [facility]  . Anxiety   . Anxiety   . Asthma    seasonal allergies, Dx age 68  . Depression   . Fatty liver   . GERD (gastroesophageal reflux disease)   . Headache   . Hypertension 2012   Dr. Grace Isaac    Past Surgical History:  Procedure Laterality Date  . ADENOIDECTOMY    . ESOPHAGOGASTRODUODENOSCOPY (EGD) WITH PROPOFOL N/A 03/16/2015   Procedure: ESOPHAGOGASTRODUODENOSCOPY (EGD) WITH PROPOFOL;  Surgeon:  Inda Castle, MD;  Location: [facility];  Service: Endoscopy;  Laterality: N/A;  . TONSILLECTOMY      Family History  Problem Relation Age of Onset  . Hypertension Father   . Heart disease Paternal Grandfather   . Hypertension Paternal Grandfather   . Aortic aneurysm Paternal Grandfather   . Diabetes Mother   . Diabetes Maternal Uncle     x2  . Colon cancer Neg Hx   . Colon polyps Neg Hx   . Esophageal cancer Neg Hx   . Kidney disease Neg Hx   . Gallbladder disease Neg Hx     Social History:  reports that he has never smoked. He has never used smokeless tobacco. He reports that he does not drink alcohol or use drugs.  He denies any exposure to radiation or toxins.  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  The patient is accompanied by his mother today.  Allergies: No Known Allergies  Current Medications: Current Outpatient Prescriptions  Medication Sig Dispense Refill  . clonazePAM (KLONOPIN) 0.5 MG tablet Take 1 tablet (0.5 mg total) by mouth at bedtime as needed. 30 tablet 5  . ergocalciferol (VITAMIN D2) 50000 units capsule Take 50,000 Units by mouth once a week.    Marland Kitchen lisinopril (PRINIVIL,ZESTRIL) 10 MG tablet Take 1 tablet (10 mg total) by mouth daily. 90 tablet 0  . montelukast (SINGULAIR) 10 MG tablet Take 1 tablet (10 mg  total) by mouth at bedtime. 90 tablet 4  . sertraline (ZOLOFT) 100 MG tablet TAKE 2 TABLETS (100 MG TOTAL) BY MOUTH DAILY. 180 tablet 0  . topiramate (TOPAMAX) 100 MG tablet Take 1.5 tablets (150 mg total) by mouth at bedtime. 45 tablet 3  . Topiramate ER 50 MG CP24 Take 150 mg by mouth at bedtime. 90 capsule 5   No current facility-administered medications for this visit.     Review of Systems:  GENERAL:  Feels fine.  Active.  No fevers, sweats or weight loss. PERFORMANCE STATUS (ECOG):  0 HEENT:  No visual changes, runny nose, sore throat, mouth sores or tenderness. Lungs: No shortness of breath or cough.  No  hemoptysis. Cardiac:  No chest pain, palpitations, orthopnea, or PND. GI:  No nausea, vomiting, diarrhea, constipation, melena or hematochezia. GU:  No urgency, frequency, dysuria, or hematuria. Musculoskeletal:  No back pain.  No joint pain.  No muscle tenderness. Extremities:  No pain or swelling. Skin:  No rashes or skin changes. Neuro:  No headache, numbness or weakness, balance or coordination issues. Endocrine:  No diabetes, thyroid issues, hot flashes or night sweats. Psych:  No mood changes, depression or anxiety. Pain:  No focal pain. Review of systems:  All other systems reviewed and found to be negative.  Physical Exam: Blood pressure 120/71, pulse 80, temperature 97.1 F (36.2 C), temperature source Tympanic, resp. rate 18, height 6' 2" (1.88 m), weight (!) 452 lb 2.6 oz (205.1 kg). GENERAL:  Well developed, well nourished, overweight young man sitting comfortably in the exam room in no acute distress. MENTAL STATUS:  Alert and oriented to person, place and time. HEAD:  Shaved head.  Normocephalic, atraumatic, face symmetric, no Cushingoid features. EYES:  Pupils equal round and reactive to light and accomodation.  No conjunctivitis or scleral icterus. ENT:  Oropharynx clear without lesion.  Tongue normal. Mucous membranes moist.  RESPIRATORY:  Clear to auscultation without rales, wheezes or rhonchi. CARDIOVASCULAR:  Regular rate and rhythm without murmur, rub or gallop. ABDOMEN:  Soft, non-tender, with active bowel sounds, and no appreciable hepatosplenomegaly.  No masses. SKIN:  No rashes, ulcers or lesions. EXTREMITIES: No edema, no skin discoloration or tenderness.  No palpable cords. LYMPH NODES: No palpable cervical, supraclavicular, axillary or inguinal adenopathy. NEUROLOGICAL: Unremarkable. PSYCH:  Appropriate.  Appointment on 07/25/2016  Component Date Value Ref Range Status  . CMV IgM 07/26/2016 <30.0  0.0 - 29.9 AU/mL Final   Comment: (NOTE)                                 Negative         <30.0                                Equivocal  30.0 - 34.9                                Positive         >34.9 A positive result is generally indicative of acute infection, reactivation or persistent IgM production. Performed At: [facility] [place], [place] [id number] Lindon Romp MD [id number]   . CMV Ab - IgG 07/26/2016 <0.60  0.00 - 0.59 U/mL Final   Comment: (NOTE)  Negative          <0.60                               Equivocal   0.60 - 0.69                               Positive          >0.69 Performed At: [facility] [place], [place] [id number] Lindon Romp MD [id number]   . EBV VCA IgG 07/26/2016 >600.0* 0.0 - 17.9 U/mL Final   Comment: (NOTE)                                 Negative        <18.0                                 Equivocal 18.0 - 21.9                                 Positive        >21.9 Performed At: [facility] [place], [place] [id number] Lindon Romp MD [id number]   . EBV VCA IgM 07/26/2016 <36.0  0.0 - 35.9 U/mL Final   Comment: (NOTE)                                 Negative        <36.0                                 Equivocal 36.0 - 43.9                                 Positive        >43.9   . Sodium 07/25/2016 137  135 - 145 mmol/L Final  . Potassium 07/25/2016 3.6  3.5 - 5.1 mmol/L Final  . Chloride 07/25/2016 111  101 - 111 mmol/L Final  . CO2 07/25/2016 21* 22 - 32 mmol/L Final  . Glucose, Bld 07/25/2016 123* 65 - 99 mg/dL Final  . BUN 07/25/2016 13  6 - 20 mg/dL Final  . Creatinine, Ser 07/25/2016 1.06  0.61 - 1.24 mg/dL Final  . Calcium 07/25/2016 9.1  8.9 - 10.3 mg/dL Final  . Total Protein 07/25/2016 7.2  6.5 - 8.1 g/dL Final  . Albumin 07/25/2016 4.0  3.5 - 5.0 g/dL Final  . AST 07/25/2016 46* 15 - 41 U/L Final  . ALT 07/25/2016 89* 17 - 63 U/L Final  . Alkaline Phosphatase  07/25/2016 59  38 - 126 U/L Final  . Total Bilirubin 07/25/2016 0.4  0.3 - 1.2 mg/dL Final  . GFR calc non Af Amer 07/25/2016 >60  >60 mL/min Final  . GFR calc Af Amer 07/25/2016 >60  >60 mL/min Final   Comment: (NOTE) The eGFR has been calculated using the CKD EPI equation. This calculation has not been validated in all clinical situations. eGFR's persistently <60  mL/min signify possible Chronic Kidney Disease.   . Anion gap 07/25/2016 5  5 - 15 Final  . WBC 07/25/2016 7.1  3.8 - 10.6 K/uL Final  . RBC 07/25/2016 5.05  4.40 - 5.90 MIL/uL Final  . Hemoglobin 07/25/2016 15.6  13.0 - 18.0 g/dL Final  . HCT 07/25/2016 44.2  40.0 - 52.0 % Final  . MCV 07/25/2016 87.5  80.0 - 100.0 fL Final  . MCH 07/25/2016 30.9  26.0 - 34.0 pg Final  . MCHC 07/25/2016 35.3  32.0 - 36.0 g/dL Final  . RDW 07/25/2016 14.4  11.5 - 14.5 % Final  . Platelets 07/25/2016 283  150 - 440 K/uL Final  . Neutrophils Relative % 07/25/2016 52  % Final  . Neutro Abs 07/25/2016 3.7  1.4 - 6.5 K/uL Final  . Lymphocytes Relative 07/25/2016 36  % Final  . Lymphs Abs 07/25/2016 2.6  1.0 - 3.6 K/uL Final  . Monocytes Relative 07/25/2016 8  % Final  . Monocytes Absolute 07/25/2016 0.6  0.2 - 1.0 K/uL Final  . Eosinophils Relative 07/25/2016 3  % Final  . Eosinophils Absolute 07/25/2016 0.2  0 - 0.7 K/uL Final  . Basophils Relative 07/25/2016 1  % Final  . Basophils Absolute 07/25/2016 0.0  0 - 0.1 K/uL Final    Assessment:  Larry Rogers is a 20 y.o. male with an apparent acute viral illness from 06/28/2016 - 07/04/2016.  Symptoms included fever, sweats, nausea, vomiting, sinus drainage, sore throat, slight cough, loose stools and abdominal pain.  He likely has reactive adenopathy.  Labs on 07/01/2016 revealed the following negative studies:  H1N1 flu, influenza A and B, and HIV testing.  Abdominal and pelvic CT scan on 07/01/2016 revealed hepatic steatosis. There were mildly enlarged upper abdominal lymph nodes,  indeterminate.  Specifically there was a 1.6 cm periancreatic lymph node, a borderline hepatoduodenal ligament lymph node, and a 1.3 cm left periaortic lymph node.  There was no splenomegaly.  CXR was negative.  Symptomatically, his symptoms resolved on 07/04/2016.  He feels good.  Plan: 1.  Discuss likely reactive adenopathy from a viral infection.  Discuss plan for repeat abdomen and pelvic CT scan in 3 months.  Patient to call if any concerning symptoms during the interim. 2.  Labs today:   CBC with diff, CMP, EBV serologies, CMV serologies. 3.  Abdomen and pelvic CT on 10/01/2016. 4.  RTC after CT scan.   Lequita Asal, MD  07/25/2016

## 2016-07-26 LAB — EBV AB TO VIRAL CAPSID AG PNL, IGG+IGM
EBV VCA IgG: >600 U/mL — ABNORMAL HIGH (ref 0.0–17.9)
EBV VCA IgM: <36 U/mL (ref 0.0–35.9)

## 2016-07-26 LAB — CMV ANTIBODY, IGG (EIA): CMV Ab - IgG: <0.6 U/mL (ref 0.00–0.59)

## 2016-07-26 LAB — CMV IGM: CMV IgM: <30 AU/mL (ref 0.0–29.9)

## 2016-08-01 ENCOUNTER — Encounter: Payer: Self-pay | Admitting: Hematology and Oncology

## 2016-08-11 ENCOUNTER — Other Ambulatory Visit: Payer: Self-pay | Admitting: Internal Medicine

## 2016-08-29 ENCOUNTER — Encounter: Payer: Self-pay | Admitting: Neurology

## 2016-08-29 ENCOUNTER — Encounter: Payer: Self-pay | Admitting: Internal Medicine

## 2016-09-02 ENCOUNTER — Ambulatory Visit (INDEPENDENT_AMBULATORY_CARE_PROVIDER_SITE_OTHER): Payer: BLUE CROSS/BLUE SHIELD | Admitting: Internal Medicine

## 2016-09-02 ENCOUNTER — Encounter: Payer: Self-pay | Admitting: Internal Medicine

## 2016-09-02 VITALS — BP 118/90 | HR 145 | Temp 98.3°F | Resp 14 | Ht 74.0 in | Wt >= 6400 oz

## 2016-09-02 DIAGNOSIS — I10 Essential (primary) hypertension: Secondary | ICD-10-CM | POA: Diagnosis not present

## 2016-09-02 DIAGNOSIS — F4521 Hypochondriasis: Secondary | ICD-10-CM

## 2016-09-02 DIAGNOSIS — F411 Generalized anxiety disorder: Secondary | ICD-10-CM | POA: Diagnosis not present

## 2016-09-02 MED ORDER — BUSPIRONE HCL 7.5 MG PO TABS: 7.5000 mg | ORAL_TABLET | Freq: Three times a day (TID) | ORAL | 1 refills | Status: DC

## 2016-09-02 NOTE — Patient Instructions (Signed)
This is  Dr. 's  example of a  "Low GI"  Diet:  It will allow you to lose 4 to 8  lbs  per month if you follow it carefully.  Your goal with exercise is a minimum of 30 minutes of aerobic exercise 5 days per week (Walking does not count once it becomes easy!)    All of the foods can be found at grocery stores and in bulk at [employer].  The Atkins protein bars and shakes are available in more varieties at Target, WalMart and Lowe's Foods.     7 AM Breakfast:  Choose from the following:  Low carbohydrate Protein  Shakes (I recommend the  Premier Protein chocolate shakes,  EAS AdvantEdge "Carb Control" shakes  Or the Atkins shakes all are under 3 net carbs)     a scrambled egg/bacon/cheese burrito made with Mission's "carb balance" whole wheat tortilla  (about 10 net carbs )  Jimmy Deans sells microwaveable frittata (basically a quiche without the pastry crust) that is eaten cold and very convenient way to get your eggs.  8 carbs)  If you make your own protein shakes, avoid bananas and pineapple,  And use low carb greek yogurt or original /unsweetened almond or soy milk    Avoid cereal and bananas, oatmeal and cream of wheat and grits. They are loaded with carbohydrates!   10 AM: high protein snack:  Protein bar by Atkins (the snack size, under 200 cal, usually < 6 net carbs).    A stick of cheese:  Around 1 carb,  100 cal     Dannon Light n Fit Greek Yogurt  (80 cal, 8 carbs)  Other so called "protein bars" and Greek yogurts tend to be loaded with carbohydrates.  Remember, in food advertising, the word "energy" is synonymous for " carbohydrate."  Lunch:   A Sandwich using the bread choices listed, Can use any  Eggs,  lunchmeat, grilled meat or canned tuna), avocado, regular mayo/mustard  and cheese.  A Salad using blue cheese, ranch,  Goddess or vinagrette,  Avoid taco shells, croutons or "confetti" and no "candied nuts" but regular nuts OK.   No pretzels, nabs  or chips.  Pickles and  miniature sweet peppers are a good low carb alternative that provide a "crunch"  The bread is the only source of carbohydrate in a sandwich and  can be decreased by trying some of the attached alternatives to traditional loaf bread   Avoid "Low fat dressings, as well as [place] and [place] dressings They are loaded with sugar!   3 PM/ Mid day  Snack:  Consider  1 ounce of  almonds, walnuts, pistachios, pecans, peanuts,  Macadamia nuts or a nut medley.  Avoid "granola and granola bars "  Mixed nuts are ok in moderation as long as there are no raisins,  cranberries or dried fruit.   KIND bars are OK if you get the low glycemic index variety   Try the prosciutto/mozzarella cheese sticks by Fiorruci  In deli /backery section   High protein      6 PM  Dinner:     Meat/fowl/fish with a green salad, and either broccoli, cauliflower, green beans, spinach, brussel sprouts or  Lima beans. DO NOT BREAD THE PROTEIN!!      There is a low carb pasta by Dreamfield's that is acceptable and tastes great: only 5 digestible carbs/serving.( All grocery stores but BJs carry it ) Several ready made meals are   available low carb:   Try Michel Angelo's chicken piccata or chicken or eggplant parm over low carb pasta.(Lowes and BJs)   Aaron Sanchez's "Carnitas" (pulled pork, no sauce,  0 carbs) or his beef pot roast to make a dinner burrito (at BJ's)  Pesto over low carb pasta (bj's sells a good quality pesto in the center refrigerated section of the deli   Try satueeing  Bok Choy with mushroooms as a good side   Green Giant makes a mashed cauliflower that tastes like mashed potatoes  Whole wheat pasta is still full of digestible carbs and  Not as low in glycemic index as Dreamfield's.   Brown rice is still rice,  So skip the rice and noodles if you eat Chinese or Thai (or at least limit to 1/2 cup)  9 PM snack :   Breyer's "low carb" fudgsicle or  ice cream bar (Carb Smart line), or  Weight Watcher's ice  cream bar , or another "no sugar added" ice cream;  a serving of fresh berries/cherries with whipped cream   Cheese or DANNON'S LlGHT N FIT GREEK YOGURT  8 ounces of [place] unsweetened almond/cococunut milk    Treat yourself to a parfait made with whipped cream blueberiies, walnuts and vanilla greek yogurt  Avoid bananas, pineapple, grapes  and watermelon on a regular basis because they are high in sugar.  THINK OF THEM AS DESSERT  Remember that snack Substitutions should be less than 10 NET carbs per serving and meals < 20 carbs. Remember to subtract fiber grams to get the "net carbs."   

## 2016-09-02 NOTE — Progress Notes (Signed)
Subjective:  Patient ID: Larry Rogers, male    DOB: 1996-03-24  Age: 20 y.o. MRN: [id number]  CC: The primary encounter diagnosis was Hypochondriasis. Diagnoses of GAD (generalized anxiety disorder), Essential hypertension, benign, Morbid obesity (HCC), and Hypochondriasis (or hypochondriacal neurosis) were also pertinent to this visit.  HPI Larry Rogers presents for follow up on morbid obesity and anxiety.  He was recently disappointed by the decision of his insurance company to deny authorization for bariatric surgery because he has not tried other options   Has gained 9 lbs since nov 9th.  He reports that he has no motivation .  Has not been exercising .  Anxiety out of control  Stays in his room.  Hypochondriac.  Has not been out of the house except to visit friends. No fear or agoraphobia.   Eating habits:  Not following the recommended diet plan.  Skips breakfast.  Eats a sub sandwhich for lunch.  Eats mother's dinner which is balanced.  Eats bowl of cereal,  Chips, sandwhiches,  Averages 2 snacks after  Dinner.      Outpatient Medications Prior to Visit  Medication Sig Dispense Refill  . clonazePAM (KLONOPIN) 0.5 MG tablet Take 1 tablet (0.5 mg total) by mouth at bedtime as needed. 30 tablet 5  . ergocalciferol (VITAMIN D2) 50000 units capsule Take 50,000 Units by mouth once a week.    Marland Kitchen lisinopril (PRINIVIL,ZESTRIL) 10 MG tablet TAKE 1 TABLET (10 MG TOTAL) BY MOUTH DAILY. 90 tablet 1  . montelukast (SINGULAIR) 10 MG tablet Take 1 tablet (10 mg total) by mouth at bedtime. 90 tablet 4  . sertraline (ZOLOFT) 100 MG tablet TAKE 2 TABLETS (100 MG TOTAL) BY MOUTH DAILY. 180 tablet 0  . topiramate (TOPAMAX) 100 MG tablet Take 1.5 tablets (150 mg total) by mouth at bedtime. 45 tablet 3  . Topiramate ER 50 MG CP24 Take 150 mg by mouth at bedtime. 90 capsule 5   No facility-administered medications prior to visit.     Review of Systems;  Patient denies headache, fevers, malaise,  unintentional weight loss, skin rash, eye pain, sinus congestion and sinus pain, sore throat, dysphagia,  hemoptysis , cough, dyspnea, wheezing, chest pain, palpitations, orthopnea, edema, abdominal pain, nausea, melena, diarrhea, constipation, flank pain, dysuria, hematuria, urinary  Frequency, nocturia, numbness, tingling, seizures,  Focal weakness, Loss of consciousness,  Tremor, insomnia, depression, and suicidal ideation.      Objective:  BP 118/90   Pulse (!) 145   Temp 98.3 F (36.8 C)   Resp 14   Ht 6\' 2"  (1.88 m)   Wt (!) 461 lb 12 oz (209.4 kg)   SpO2 98%   BMI 59.29 kg/m   BP Readings from Last 3 Encounters:  09/02/16 118/90  07/25/16 120/71  07/01/16 125/65    Wt Readings from Last 3 Encounters:  09/02/16 (!) 461 lb 12 oz (209.4 kg)  07/25/16 (!) 452 lb 2.6 oz (205.1 kg)  07/01/16 (!) 458 lb (207.7 kg)    General appearance: alert, morbidly obese, anxious, cooperative and appears stated age Ears: normal TM's and external ear canals both ears Throat: lips, mucosa, and tongue normal; teeth and gums normal Neck: no adenopathy, no carotid bruit, supple, symmetrical, trachea midline and thyroid not enlarged, symmetric, no tenderness/mass/nodules Back: symmetric, no curvature. ROM normal. No CVA tenderness. Lungs: clear to auscultation bilaterally Heart: tachycardiac , S1, S2 normal, no murmur, click, rub or gallop Abdomen: soft, non-tender; bowel sounds normal; no masses,  no organomegaly Pulses: 2+ and symmetric Skin: Skin color, texture, turgor normal. No rashes or lesions Lymph nodes: Cervical, supraclavicular, and axillary nodes normal.  Lab Results  Component Value Date   HGBA1C 5.0 06/21/2014    Lab Results  Component Value Date   CREATININE 1.06 07/25/2016   CREATININE 0.95 07/01/2016   CREATININE 0.9 06/09/2015    Lab Results  Component Value Date   WBC 7.1 07/25/2016   HGB 15.6 07/25/2016   HCT 44.2 07/25/2016   PLT 283 07/25/2016   GLUCOSE  123 (H) 07/25/2016   CHOL 137 06/09/2015   TRIG 177 (A) 06/09/2015   HDL 29 (A) 06/09/2015   LDLCALC 73 06/09/2015   ALT 89 (H) 07/25/2016   AST 46 (H) 07/25/2016   NA 137 07/25/2016   K 3.6 07/25/2016   CL 111 07/25/2016   CREATININE 1.06 07/25/2016   BUN 13 07/25/2016   CO2 21 (L) 07/25/2016   TSH 2.73 09/02/2015   HGBA1C 5.0 06/21/2014   MICROALBUR 0.84 03/04/2013   Ct Abdomen Pelvis W Contrast  Result Date: 07/01/2016 CLINICAL DATA:  Generalized abdominal pain. EXAM: CT ABDOMEN AND PELVIS WITH CONTRAST TECHNIQUE: Multidetector CT imaging of the abdomen and pelvis was performed using the standard protocol following bolus administration of intravenous contrast. CONTRAST:  125mL ISOVUE-300 IOPAMIDOL (ISOVUE-300) INJECTION 61% COMPARISON:  None. FINDINGS: Lower chest:  Unremarkable. Hepatobiliary: The liver shows diffusely decreased attenuation suggesting steatosis. Are There is no evidence for gallstones, gallbladder wall thickening, or pericholecystic fluid. No intrahepatic or extrahepatic biliary dilation. Pancreas: No focal mass lesion. No dilatation of the main duct. No intraparenchymal cyst. No peripancreatic edema. Spleen: No splenomegaly. No focal mass lesion. Adrenals/Urinary Tract: No adrenal nodule or mass. Kidneys are unremarkable. No evidence for hydroureter. The urinary bladder appears normal for the degree of distention. Stomach/Bowel: Stomach is nondistended. No gastric wall thickening. No evidence of outlet obstruction. Duodenum is normally positioned as is the ligament of Treitz. No small bowel wall thickening. No small bowel dilatation. The terminal ileum is normal. The appendix is normal. No gross colonic mass. No colonic wall thickening. No substantial diverticular change. Vascular/Lymphatic: No abdominal aortic aneurysm. No abdominal aortic atherosclerotic calcification. 16 mm short axis peripancreatic lymph node is seen on image 31 series 2. Borderline hepatoduodenal  ligament lymph nodes are evident. 13 mm short axis left para-aortic lymph node is seen on image 51 series 2. No pelvic sidewall lymphadenopathy. Reproductive: The prostate gland and seminal vesicles have normal imaging features. Other: No intraperitoneal free fluid. Musculoskeletal: Bone windows reveal no worrisome lytic or sclerotic osseous lesions. IMPRESSION: 1. Hepatic steatosis. 2. Mildly enlarged upper abdominal lymph nodes, indeterminate. As lymphoma is not excluded, follow-up CT in 3 months recommended to ensure stability. If more immediate characterization is indicated, PET-CT may prove helpful to further evaluate. These results will be called to the ordering clinician or representative by the Radiologist Assistant, and communication documented in the PACS or zVision Dashboard. Electronically Signed   By: Kennith CenterEric  Mansell M.D.   On: 07/01/2016 17:42    Assessment & Plan:   Problem List Items Addressed This Visit    Essential hypertension, benign    Well controlled on current regimen. Renal function stable, no changes today.      GAD (generalized anxiety disorder)    complidated by obsessive features and hypochondriasis. Managed with zoloft 200 mg daily and once daily clonazepam.  Adding buspirone up to three times daily, psychiatry referral advised and accepted.  Relevant Orders   Ambulatory referral to Psychiatry   Ambulatory referral to Psychology   Hypochondriasis (or hypochondriacal neurosis)    Adding buspirone , psychiatry and psychology referrals      Morbid obesity (HCC)    Encouraged him to start a walking program now and follow the diet plain outlined. Referral to psychotherapy advised and accepted.        Other Visit Diagnoses    Hypochondriasis    -  Primary   Relevant Orders   Ambulatory referral to Psychiatry   Ambulatory referral to Psychology     A total of 25 minutes of face to face time was spent with patient more than half of which was spent in  counselling about the above mentioned conditions  and coordination of care  I am having Larry Rogers start on busPIRone. I am also having him maintain his montelukast, Topiramate ER, topiramate, clonazePAM, sertraline, ergocalciferol, and lisinopril.  Meds ordered this encounter  Medications  . busPIRone (BUSPAR) 7.5 MG tablet    Sig: Take 1 tablet (7.5 mg total) by mouth 3 (three) times daily.    Dispense:  90 tablet    Refill:  1    There are no discontinued medications.  Follow-up: No Follow-up on file.   Sherlene ShamsULLO,  L, MD

## 2016-09-03 DIAGNOSIS — F4521 Hypochondriasis: Secondary | ICD-10-CM | POA: Insufficient documentation

## 2016-09-03 NOTE — Assessment & Plan Note (Signed)
Adding buspirone , psychiatry and psychology referrals

## 2016-09-03 NOTE — Assessment & Plan Note (Signed)
Encouraged him to start a walking program now and follow the diet plain outlined. Referral to psychotherapy advised and accepted.

## 2016-09-03 NOTE — Assessment & Plan Note (Signed)
Well controlled on current regimen. Renal function stable, no changes today. 

## 2016-09-03 NOTE — Assessment & Plan Note (Addendum)
complidated by obsessive features and hypochondriasis. Managed with zoloft 200 mg daily and once daily clonazepam.  Adding buspirone up to three times daily, psychiatry referral advised and accepted.

## 2016-09-13 ENCOUNTER — Ambulatory Visit
Admission: RE | Admit: 2016-09-13 | Discharge: 2016-09-13 | Disposition: A | Discharge status: Home / Self Care | Payer: BLUE CROSS/BLUE SHIELD | Source: Ambulatory Visit | Attending: Hematology and Oncology | Admitting: Hematology and Oncology

## 2016-09-13 DIAGNOSIS — R591 Generalized enlarged lymph nodes: Secondary | ICD-10-CM

## 2016-09-13 DIAGNOSIS — K760 Fatty (change of) liver, not elsewhere classified: Secondary | ICD-10-CM | POA: Insufficient documentation

## 2016-09-13 DIAGNOSIS — R599 Enlarged lymph nodes, unspecified: Secondary | ICD-10-CM

## 2016-09-13 MED ORDER — IOPAMIDOL (ISOVUE-370) INJECTION 76%
125.0000 mL | Freq: Once | INTRAVENOUS | Status: AC | PRN
Start: 2016-09-13 — End: 2016-09-13
  Administered 2016-09-13: 125 mL via INTRAVENOUS

## 2016-09-15 ENCOUNTER — Other Ambulatory Visit: Payer: Self-pay | Admitting: Internal Medicine

## 2016-09-19 MED ORDER — SERTRALINE HCL 100 MG PO TABS: 100.0000 mg | ORAL_TABLET | Freq: Every day | ORAL | 0 refills | Status: DC

## 2016-09-19 NOTE — Telephone Encounter (Signed)
Medication has been refilled.

## 2016-09-29 ENCOUNTER — Other Ambulatory Visit: Payer: Self-pay | Admitting: Neurology

## 2016-09-30 ENCOUNTER — Other Ambulatory Visit: Payer: Self-pay | Admitting: Internal Medicine

## 2016-10-01 ENCOUNTER — Ambulatory Visit: Payer: BLUE CROSS/BLUE SHIELD

## 2016-10-03 ENCOUNTER — Inpatient Hospital Stay: Payer: BLUE CROSS/BLUE SHIELD | Admitting: Hematology and Oncology

## 2016-10-04 ENCOUNTER — Inpatient Hospital Stay: Payer: BLUE CROSS/BLUE SHIELD | Attending: Hematology and Oncology | Admitting: Hematology and Oncology

## 2016-10-04 VITALS — BP 130/92 | HR 129 | Temp 99.5°F | Resp 18 | Wt >= 6400 oz

## 2016-10-04 DIAGNOSIS — I10 Essential (primary) hypertension: Secondary | ICD-10-CM | POA: Diagnosis not present

## 2016-10-04 DIAGNOSIS — Z79899 Other long term (current) drug therapy: Secondary | ICD-10-CM | POA: Diagnosis not present

## 2016-10-04 DIAGNOSIS — Z8669 Personal history of other diseases of the nervous system and sense organs: Secondary | ICD-10-CM | POA: Diagnosis not present

## 2016-10-04 DIAGNOSIS — F419 Anxiety disorder, unspecified: Secondary | ICD-10-CM | POA: Diagnosis not present

## 2016-10-04 DIAGNOSIS — K219 Gastro-esophageal reflux disease without esophagitis: Secondary | ICD-10-CM | POA: Insufficient documentation

## 2016-10-04 DIAGNOSIS — F329 Major depressive disorder, single episode, unspecified: Secondary | ICD-10-CM

## 2016-10-04 DIAGNOSIS — R591 Generalized enlarged lymph nodes: Secondary | ICD-10-CM

## 2016-10-04 DIAGNOSIS — J45909 Unspecified asthma, uncomplicated: Secondary | ICD-10-CM | POA: Diagnosis not present

## 2016-10-04 DIAGNOSIS — K760 Fatty (change of) liver, not elsewhere classified: Secondary | ICD-10-CM | POA: Insufficient documentation

## 2016-10-04 DIAGNOSIS — R599 Enlarged lymph nodes, unspecified: Secondary | ICD-10-CM

## 2016-10-04 DIAGNOSIS — R948 Abnormal results of function studies of other organs and systems: Secondary | ICD-10-CM | POA: Diagnosis not present

## 2016-10-04 DIAGNOSIS — R590 Localized enlarged lymph nodes: Secondary | ICD-10-CM | POA: Diagnosis present

## 2016-10-04 NOTE — Progress Notes (Signed)
[facility]  Clinic day:  10/04/2016   Chief Complaint: Larry Rogers is a 21 y.o. male with abdominal adenopathy who is 2 month assessment and review of interval CT scans.  HPI:  The patient was last seen in the oncology clinic on 07/25/2016.  At that time, he was feeling good.  Viral-like symptoms had resolved.  Labs included + EBV VCA IgG and negative EBV VCA IgM.  CMV IgG and IgM were negative.  LFTs were mildly elevated (AST 46 and ALT 89).  Abdomen and pelvic CT scan on 09/13/2016 revealed interval decrease in size of previously described prominent retroperitoneal lymph nodes, favored to be secondary to an improving reactive process.  Reference lymph node measured 11 mm (previously 13 mm), adjacent aortocaval lymph node measured 8 mm (peviously 14 mm), and peripancreatic lymph node measured 13 mm (previously 16 mm).  Symptomatically, he denies any complaints.   Past Medical History:  Diagnosis Date  . Acne    [place]  . Anxiety   . Anxiety   . Asthma    seasonal allergies, Dx age 34  . Depression   . Fatty liver   . GERD (gastroesophageal reflux disease)   . Headache   . Hypertension 2012   Dr. Kathrene Alu    Past Surgical History:  Procedure Laterality Date  . ADENOIDECTOMY    . ESOPHAGOGASTRODUODENOSCOPY (EGD) WITH PROPOFOL N/A 03/16/2015   Procedure: ESOPHAGOGASTRODUODENOSCOPY (EGD) WITH PROPOFOL;  Surgeon: Louis Meckel, MD;  Location: [facility];  Service: Endoscopy;  Laterality: N/A;  . TONSILLECTOMY      Family History  Problem Relation Age of Onset  . Hypertension Father   . Heart disease Paternal Grandfather   . Hypertension Paternal Grandfather   . Aortic aneurysm Paternal Grandfather   . Diabetes Mother   . Diabetes Maternal Uncle     x2  . Colon cancer Neg Hx   . Colon polyps Neg Hx   . Esophageal cancer Neg Hx   . Kidney disease Neg Hx   . Gallbladder disease Neg Hx     Social History:  reports that he  has never smoked. He has never used smokeless tobacco. He reports that he does not drink alcohol or use drugs.  He denies any exposure to radiation or toxins.  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  He lives in [place].  The patient is alone today.  Allergies: No Known Allergies  Current Medications: Current Outpatient Prescriptions  Medication Sig Dispense Refill  . busPIRone (BUSPAR) 7.5 MG tablet Take 1 tablet (7.5 mg total) by mouth 3 (three) times daily. 90 tablet 1  . clonazePAM (KLONOPIN) 0.5 MG tablet Take 1 tablet (0.5 mg total) by mouth at bedtime as needed. 30 tablet 5  . ergocalciferol (VITAMIN D2) 50000 units capsule Take 50,000 Units by mouth once a week.    Marland Kitchen lisinopril (PRINIVIL,ZESTRIL) 10 MG tablet TAKE 1 TABLET (10 MG TOTAL) BY MOUTH DAILY. 90 tablet 1  . montelukast (SINGULAIR) 10 MG tablet Take 1 tablet (10 mg total) by mouth at bedtime. 90 tablet 4  . sertraline (ZOLOFT) 100 MG tablet Take 1 tablet (100 mg total) by mouth daily. 180 tablet 0  . topiramate (TOPAMAX) 100 MG tablet TAKE 1.5 TABLETS (150 MG TOTAL) BY MOUTH AT BEDTIME. 45 tablet 3   No current facility-administered medications for this visit.     Review of Systems:  GENERAL:  Feels good.  No  fevers, sweats or weight loss. PERFORMANCE STATUS (ECOG):  0 HEENT:  No visual changes, runny nose, sore throat, mouth sores or tenderness. Lungs: No shortness of breath or cough.  No hemoptysis. Cardiac:  No chest pain, palpitations, orthopnea, or PND. GI:  No nausea, vomiting, diarrhea, constipation, melena or hematochezia. GU:  No urgency, frequency, dysuria, or hematuria. Musculoskeletal:  No back pain.  No joint pain.  No muscle tenderness. Extremities:  No pain or swelling. Skin:  No rashes or skin changes. Neuro:  No headache, numbness or weakness, balance or coordination issues. Endocrine:  No diabetes, thyroid issues, hot flashes or night sweats. Psych:  No mood changes,  depression or anxiety. Pain:  No focal pain. Review of systems:  All other systems reviewed and found to be negative.  Physical Exam: Blood pressure (!) 149/92, pulse (!) 129, temperature 99.5 F (37.5 C), temperature source Tympanic, resp. rate 18, weight (!) 462 lb 11.9 oz (209.9 kg). GENERAL:  Well developed, well nourished, overweight young man sitting comfortably in the exam room in no acute distress. MENTAL STATUS:  Alert and oriented to person, place and time. HEAD:  Shaved head.  Normocephalic, atraumatic, face symmetric, no Cushingoid features. EYES:  Pupils equal round and reactive to light and accomodation.  No conjunctivitis or scleral icterus. ENT:  Oropharynx clear without lesion.  Tongue normal. Mucous membranes moist.  RESPIRATORY:  Clear to auscultation without rales, wheezes or rhonchi. CARDIOVASCULAR:  Regular rate and rhythm without murmur, rub or gallop. ABDOMEN:  Soft, non-tender, with active bowel sounds, and no appreciable hepatosplenomegaly.  No masses. SKIN:  No rashes, ulcers or lesions. EXTREMITIES: No edema, no skin discoloration or tenderness.  No palpable cords. LYMPH NODES: No palpable cervical, supraclavicular, axillary or inguinal adenopathy. NEUROLOGICAL: Unremarkable. PSYCH:  Appropriate.   No visits with results within 3 Day(s) from this visit.  Latest known visit with results is:  Appointment on 07/25/2016  Component Date Value Ref Range Status  . CMV IgM 07/25/2016 <30.0  0.0 - 29.9 AU/mL Final   Comment: (NOTE)                                Negative         <30.0                                Equivocal  30.0 - 34.9                                Positive         >34.9 A positive result is generally indicative of acute infection, reactivation or persistent IgM production. Performed At: [facility] [place], [place] [id number] Lindon Romp MD TO:[id number]   . CMV Ab - IgG 07/25/2016 <0.60  0.00 - 0.59 U/mL Final    Comment: (NOTE)                               Negative          <0.60                               Equivocal   0.60 - 0.69  Positive          >0.69 Performed At: [facility] [place], [place] [id number] Lindon Romp MD [id number]:[id number]   . EBV VCA IgG 07/25/2016 >600.0* 0.0 - 17.9 U/mL Final   Comment: (NOTE)                                 Negative        <18.0                                 Equivocal 18.0 - 21.9                                 Positive        >21.9 Performed At: [facility] [place], [place] [id number] Lindon Romp MD VH:[id number]   . EBV VCA IgM 07/25/2016 <36.0  0.0 - 35.9 U/mL Final   Comment: (NOTE)                                 Negative        <36.0                                 Equivocal 36.0 - 43.9                                 Positive        >43.9   . Sodium 07/25/2016 137  135 - 145 mmol/L Final  . Potassium 07/25/2016 3.6  3.5 - 5.1 mmol/L Final  . Chloride 07/25/2016 111  101 - 111 mmol/L Final  . CO2 07/25/2016 21* 22 - 32 mmol/L Final  . Glucose, Bld 07/25/2016 123* 65 - 99 mg/dL Final  . BUN 07/25/2016 13  6 - 20 mg/dL Final  . Creatinine, Ser 07/25/2016 1.06  0.61 - 1.24 mg/dL Final  . Calcium 07/25/2016 9.1  8.9 - 10.3 mg/dL Final  . Total Protein 07/25/2016 7.2  6.5 - 8.1 g/dL Final  . Albumin 07/25/2016 4.0  3.5 - 5.0 g/dL Final  . AST 07/25/2016 46* 15 - 41 U/L Final  . ALT 07/25/2016 89* 17 - 63 U/L Final  . Alkaline Phosphatase 07/25/2016 59  38 - 126 U/L Final  . Total Bilirubin 07/25/2016 0.4  0.3 - 1.2 mg/dL Final  . GFR calc non Af Amer 07/25/2016 >60  >60 mL/min Final  . GFR calc Af Amer 07/25/2016 >60  >60 mL/min Final   Comment: (NOTE) The eGFR has been calculated using the CKD EPI equation. This calculation has not been validated in all clinical situations. eGFR's persistently <60 mL/min signify possible Chronic Kidney Disease.   .  Anion gap 07/25/2016 5  5 - 15 Final  . WBC 07/25/2016 7.1  3.8 - 10.6 K/uL Final  . RBC 07/25/2016 5.05  4.40 - 5.90 MIL/uL Final  . Hemoglobin 07/25/2016 15.6  13.0 - 18.0 g/dL Final  . HCT 07/25/2016 44.2  40.0 - 52.0 % Final  . MCV 07/25/2016 87.5  80.0 - 100.0 fL Final  . [facility] 07/25/2016  30.9  26.0 - 34.0 pg Final  . MCHC 07/25/2016 35.3  32.0 - 36.0 g/dL Final  . RDW 07/25/2016 14.4  11.5 - 14.5 % Final  . Platelets 07/25/2016 283  150 - 440 K/uL Final  . Neutrophils Relative % 07/25/2016 52  % Final  . Neutro Abs 07/25/2016 3.7  1.4 - 6.5 K/uL Final  . Lymphocytes Relative 07/25/2016 36  % Final  . Lymphs Abs 07/25/2016 2.6  1.0 - 3.6 K/uL Final  . Monocytes Relative 07/25/2016 8  % Final  . Monocytes Absolute 07/25/2016 0.6  0.2 - 1.0 K/uL Final  . Eosinophils Relative 07/25/2016 3  % Final  . Eosinophils Absolute 07/25/2016 0.2  0 - 0.7 K/uL Final  . Basophils Relative 07/25/2016 1  % Final  . Basophils Absolute 07/25/2016 0.0  0 - 0.1 K/uL Final    Assessment:  Larry Rogers is a 21 y.o. male with an apparent acute viral illness from 06/28/2016 - 07/04/2016.  Symptoms included fever, sweats, nausea, vomiting, sinus drainage, sore throat, slight cough, loose stools and abdominal pain.  He likely has reactive adenopathy.  Labs on 07/01/2016 revealed the following negative studies:  H1N1 flu, influenza A and B, and HIV testing.    Labs on 07/25/2017 revealed + EBV VCA IgG and negative EBV VCA IgM.  CMV IgG and IgM were negative.  LFTs were mildly elevated (AST 46 and ALT 89).  Abdominal and pelvic CT on 07/01/2016 revealed hepatic steatosis. There were mildly enlarged upper abdominal lymph nodes, indeterminate.  Specifically there was a 1.6 cm periancreatic lymph node, a borderline hepatoduodenal ligament lymph node, and a 1.3 cm left periaortic lymph node.  There was no splenomegaly.  CXR was negative.  Abdomen and pelvic CT on 09/13/2016 revealed interval decrease in size of  previously described prominent retroperitoneal lymph nodes, favored to be secondary to an improving reactive process.  Reference lymph node measured 11 mm (previously 13 mm), adjacent aortocaval lymph node measured 8 mm (peviously 14 mm), and peripancreatic lymph node measured 13 mm (previously 16 mm).  Symptomatically, feels good.  His symptoms resolved on 07/04/2016.  He feels good.  Plan: 1.  Discuss likely reactive adenopathy from a viral infection, possibly EBV.  Discuss plan for repeat abdomen and pelvic CT scan in 3 months.  Patient to call if any concerning symptoms during the interim. 2.  RTC prn.   Lequita Asal, MD  10/04/2016, 11:57 AM

## 2016-10-04 NOTE — Progress Notes (Signed)
BP elevated today - 149/92 HR 129.  Recheck 130/92.

## 2016-10-10 ENCOUNTER — Encounter: Payer: Self-pay | Admitting: Internal Medicine

## 2016-10-10 ENCOUNTER — Ambulatory Visit (INDEPENDENT_AMBULATORY_CARE_PROVIDER_SITE_OTHER): Payer: BLUE CROSS/BLUE SHIELD | Admitting: Internal Medicine

## 2016-10-10 VITALS — BP 100/80 | HR 136 | Temp 97.9°F | Resp 16 | Ht 75.0 in | Wt >= 6400 oz

## 2016-10-10 DIAGNOSIS — Z23 Encounter for immunization: Secondary | ICD-10-CM | POA: Diagnosis not present

## 2016-10-10 DIAGNOSIS — I10 Essential (primary) hypertension: Secondary | ICD-10-CM | POA: Diagnosis not present

## 2016-10-10 MED ORDER — METOPROLOL SUCCINATE ER 25 MG PO TB24: 25.0000 mg | ORAL_TABLET | Freq: Every day | ORAL | 3 refills | Status: DC

## 2016-10-10 NOTE — Progress Notes (Signed)
Subjective:  Patient ID: Larry Rogers, male    DOB: [birth year]  Age: 21 y.o. MRN: [id number]  CC: The primary encounter diagnosis was Need for diphtheria-tetanus-pertussis (Tdap) vaccine. Diagnoses of Morbid obesity (HCC) and Essential hypertension, benign were also pertinent to this visit.  HPI Larry Rogers presents for one month follow up on GAD,  Morbid obesity  Has gained 4 lbs in the last month.  55 lbs in the past year.    Started walking for 15 minutes daily, but has not tolerated the resulting tachycardia.  Hr shot up and made him uncomfortable.  Resting hr of 120   Psychiatry appointment  Is scheduled for feb 6 .  Was referred to [facility] but has not heard from them,   Outpatient Medications Prior to Visit  Medication Sig Dispense Refill  . clonazePAM (KLONOPIN) 0.5 MG tablet Take 1 tablet (0.5 mg total) by mouth at bedtime as needed. 30 tablet 5  . ergocalciferol (VITAMIN D2) 50000 units capsule Take 50,000 Units by mouth once a week.    Marland Kitchen lisinopril (PRINIVIL,ZESTRIL) 10 MG tablet TAKE 1 TABLET (10 MG TOTAL) BY MOUTH DAILY. 90 tablet 1  . montelukast (SINGULAIR) 10 MG tablet Take 1 tablet (10 mg total) by mouth at bedtime. 90 tablet 4  . sertraline (ZOLOFT) 100 MG tablet Take 1 tablet (100 mg total) by mouth daily. 180 tablet 0  . topiramate (TOPAMAX) 100 MG tablet TAKE 1.5 TABLETS (150 MG TOTAL) BY MOUTH AT BEDTIME. 45 tablet 3  . busPIRone (BUSPAR) 7.5 MG tablet Take 1 tablet (7.5 mg total) by mouth 3 (three) times daily. 90 tablet 1   No facility-administered medications prior to visit.     Review of Systems;  Patient denies headache, fevers, malaise, unintentional weight loss, skin rash, eye pain, sinus congestion and sinus pain, sore throat, dysphagia,  hemoptysis , cough, dyspnea, wheezing, chest pain, palpitations, orthopnea, edema, abdominal pain, nausea, melena, diarrhea, constipation, flank pain, dysuria, hematuria, urinary  Frequency, nocturia,  numbness, tingling, seizures,  Focal weakness, Loss of consciousness,  Tremor, insomnia, depression, anxiety, and suicidal ideation.      Objective:  BP 100/80   Pulse (!) 136   Temp 97.9 F (36.6 C) (Oral)   Resp 16   Ht 6\' 3"  (1.905 m)   Wt (!) 466 lb 8 oz (211.6 kg)   SpO2 98%   BMI 58.31 kg/m   BP Readings from Last 3 Encounters:  10/10/16 100/80  10/04/16 (!) 130/92  09/02/16 118/90    Wt Readings from Last 3 Encounters:  10/10/16 (!) 466 lb 8 oz (211.6 kg)  10/04/16 (!) 462 lb 11.9 oz (209.9 kg)  09/02/16 (!) 461 lb 12 oz (209.4 kg)    General appearance: alert, cooperative and appears stated age Ears: normal TM's and external ear canals both ears Throat: lips, mucosa, and tongue normal; teeth and gums normal Neck: no adenopathy, no carotid bruit, supple, symmetrical, trachea midline and thyroid not enlarged, symmetric, no tenderness/mass/nodules Back: symmetric, no curvature. ROM normal. No CVA tenderness. Lungs: clear to auscultation bilaterally Heart: regular rate and rhythm, S1, S2 normal, no murmur, click, rub or gallop Abdomen: soft, non-tender; bowel sounds normal; no masses,  no organomegaly Pulses: 2+ and symmetric Skin: Skin color, texture, turgor normal. No rashes or lesions Lymph nodes: Cervical, supraclavicular, and axillary nodes normal.  Lab Results  Component Value Date   HGBA1C 5.0 06/21/2014    Lab Results  Component Value Date   CREATININE  1.06 07/25/2016   CREATININE 0.95 07/01/2016   CREATININE 0.9 06/09/2015    Lab Results  Component Value Date   WBC 7.1 07/25/2016   HGB 15.6 07/25/2016   HCT 44.2 07/25/2016   PLT 283 07/25/2016   GLUCOSE 123 (H) 07/25/2016   CHOL 137 06/09/2015   TRIG 177 (A) 06/09/2015   HDL 29 (A) 06/09/2015   LDLCALC 73 06/09/2015   ALT 89 (H) 07/25/2016   AST 46 (H) 07/25/2016   NA 137 07/25/2016   K 3.6 07/25/2016   CL 111 07/25/2016   CREATININE 1.06 07/25/2016   BUN 13 07/25/2016   CO2 21 (L)  07/25/2016   TSH 2.73 09/02/2015   HGBA1C 5.0 06/21/2014   MICROALBUR 0.84 03/04/2013    Ct Abdomen Pelvis W Contrast  Result Date: 09/13/2016 CLINICAL DATA:  Patient with history of lymphadenopathy. EXAM: CT ABDOMEN AND PELVIS WITH CONTRAST TECHNIQUE: Multidetector CT imaging of the abdomen and pelvis was performed using the standard protocol following bolus administration of intravenous contrast. CONTRAST:  125 cc Isovue 370 COMPARISON:  CT abdomen pelvis 07/01/2016. FINDINGS: Lower chest: Normal heart size. No consolidative pulmonary opacities. No pleural effusion. Hepatobiliary: Liver is diffusely low in attenuation compatible with steatosis. No focal hepatic lesion is identified. Gallbladder is decompressed. Pancreas: Unremarkable Spleen: Unremarkable Adrenals/Urinary Tract: The adrenal glands are normal. Kidneys enhance symmetrically with contrast. No hydronephrosis. Urinary bladder is unremarkable. Stomach/Bowel: Stomach is within normal limits. Appendix appears normal. No evidence of bowel wall thickening, distention, or inflammatory changes. Vascular/Lymphatic: Normal caliber abdominal aorta. Interval decrease in previously described retroperitoneal adenopathy with a reference lymph node measuring 11 mm (image 48; series 2), previously 13 mm. Adjacent aortocaval lymph node measures 8 mm (image 46; series 2), previously 14 mm. Peripancreatic lymph node measures 13 mm, previously 16 mm (image 27; series 2). Reproductive: Prostate is unremarkable. Other: No abdominal wall hernia or abnormality. No abdominopelvic ascites. Musculoskeletal: No aggressive or acute appearing osseous lesions. IMPRESSION: Interval decrease in size of previously described prominent retroperitoneal lymph nodes, favored to be secondary to an improving reactive process. Hepatic steatosis. Electronically Signed   By: Annia Belt M.D.   On: 09/13/2016 09:53    Assessment & Plan:   Problem List Items Addressed This Visit     Essential hypertension, benign    ADDING tOPROL xl 25 MG DAILY       Relevant Medications   metoprolol succinate (TOPROL-XL) 25 MG 24 hr tablet   Morbid obesity (HCC)    At last visit I encouraged him to start a walking program now and follow the diet plain outlined. He continues to gain weight and has developed fear of walking because of tachycardia.  Referral to psychotherapy advised and accepted for treatment of GAD and hypochondriasis        Other Visit Diagnoses    Need for diphtheria-tetanus-pertussis (Tdap) vaccine    -  Primary   Relevant Orders   Tdap vaccine greater than or equal to 7yo IM (Completed)      I am having Mr. Hartt start on metoprolol succinate. I am also having him maintain his montelukast, clonazePAM, ergocalciferol, lisinopril, sertraline, and topiramate.  Meds ordered this encounter  Medications  . metoprolol succinate (TOPROL-XL) 25 MG 24 hr tablet    Sig: Take 1 tablet (25 mg total) by mouth daily.    Dispense:  90 tablet    Refill:  3    There are no discontinued medications.  Follow-up: Return in about 3  months (around 01/08/2017).   Sherlene ShamsULLO, TERESA L, MD

## 2016-10-10 NOTE — Progress Notes (Signed)
Pre-visit discussion using our clinic review tool. No additional management support is needed unless otherwise documented below in the visit note.  

## 2016-10-10 NOTE — Patient Instructions (Addendum)
Here are the names of several well respected male therapists   Montel ClockKatherine Wagner    [phone]  [place] Karen [place]   [phone]  [place] Valerie Padgett [phone]  [place] Jane Perrin  [phone]  [place]    I am starting you on Metoprolol XL at bedtime 25 mg to control your rate.  If BP becomes < 110/060  . Stop the lisinopril and continue the metoprolol

## 2016-10-11 ENCOUNTER — Telehealth: Payer: Self-pay | Admitting: Internal Medicine

## 2016-10-11 MED ORDER — BUSPIRONE HCL 7.5 MG PO TABS
7.5000 mg | ORAL_TABLET | Freq: Three times a day (TID) | ORAL | 2 refills | Status: DC
Start: 2016-10-11 — End: 2017-04-18

## 2016-10-11 NOTE — Telephone Encounter (Signed)
Medication sent to pharmacy  

## 2016-10-11 NOTE — Telephone Encounter (Signed)
busPIRone (BUSPAR) 7.5 MG tablet take 1 tablet by mouth 3 time daily. Quantity 270.

## 2016-10-12 NOTE — Assessment & Plan Note (Addendum)
At last visit I encouraged him to start a walking program now and follow the diet plain outlined. He continues to gain weight and has developed fear of walking because of tachycardia.  Referral to psychotherapy advised and accepted for treatment of GAD and hypochondriasis

## 2016-10-12 NOTE — Assessment & Plan Note (Signed)
ADDING tOPROL xl 25 MG DAILY

## 2016-10-24 ENCOUNTER — Encounter: Payer: Self-pay | Admitting: Neurology

## 2016-10-24 ENCOUNTER — Ambulatory Visit (INDEPENDENT_AMBULATORY_CARE_PROVIDER_SITE_OTHER): Payer: BLUE CROSS/BLUE SHIELD | Admitting: Neurology

## 2016-10-24 VITALS — BP 130/82 | HR 90 | Ht 75.0 in | Wt >= 6400 oz

## 2016-10-24 DIAGNOSIS — G43009 Migraine without aura, not intractable, without status migrainosus: Secondary | ICD-10-CM

## 2016-10-24 NOTE — Progress Notes (Signed)
NEUROLOGY FOLLOW UP OFFICE NOTE  Daun Peacocklston C Degraffenreid [phone]  HISTORY OF PRESENT ILLNESS: Larry Rogers is an 21 year old right-handed male with hypertension and anxiety who follows up for migraine.   UPDATE: Headaches are well-controlled.  Slightly increased frequency due to poor sleep schedule. Intensity:  3/10 Duration:  1 hour Frequency:  once a week (sometimes twice a week) Current abortive medication:  sometimes Tylenol, but usually none Antihypertensive medications:  none Antidepressant medications:  Sertraline 50mg  Anticonvulsant medications:  topramate 150mg  Vitamins/Herbal/Supplements:  none Other therapy:  none   Due to tingling, we tried switching to Trokendi XR, but patient found it too expensive.  However, the paresthesias have improved.  07/25/16:  BUN 13, Cr 1.06  HISTORY: Onset:  5 months ago. Location:  Right temporal/parietal region Quality:  Sharp/stabbing Initial Intensity:  7/10; August: 4.5/10 Aura:  no Prodrome:  no Associated symptoms:  Right eye tears and twitches, dilated right pupil.  Mild photophobia.  He denies double vision.  He has longstanding history of seeing floaters. Initial Duration:  Off and on throughout the day; August: 2 hours Initial Frequency:  Daily.  Goes to sleep and wakes up with it.; August: 2 days per month Relieving factors:  Laying down and napping Activity:  Able to function.     Past abortive medication:  Tylenol, Excedrin, Advil, Ibuprofen (ineffective)   He had a CT of the head without contrast performed on 02/12/15 for the headaches, as well as reported left-sided numbness.  It was negative.   MRI of brain with and without contrast from 03/23/15 was normal.    PAST MEDICAL HISTORY: Past Medical History:  Diagnosis Date  . Acne    [place] Skin  . Anxiety   . Anxiety   . Asthma    seasonal allergies, Dx age 89  . Depression   . Fatty liver   . GERD (gastroesophageal reflux disease)   . Headache   .  Hypertension 2012   Dr. Kathrene AluArchinald    MEDICATIONS: Current Outpatient Prescriptions on File Prior to Visit  Medication Sig Dispense Refill  . busPIRone (BUSPAR) 7.5 MG tablet Take 1 tablet (7.5 mg total) by mouth 3 (three) times daily. 90 tablet 2  . clonazePAM (KLONOPIN) 0.5 MG tablet Take 1 tablet (0.5 mg total) by mouth at bedtime as needed. 30 tablet 5  . ergocalciferol (VITAMIN D2) 50000 units capsule Take 50,000 Units by mouth once a week.    Marland Kitchen. lisinopril (PRINIVIL,ZESTRIL) 10 MG tablet TAKE 1 TABLET (10 MG TOTAL) BY MOUTH DAILY. 90 tablet 1  . metoprolol succinate (TOPROL-XL) 25 MG 24 hr tablet Take 1 tablet (25 mg total) by mouth daily. 90 tablet 3  . montelukast (SINGULAIR) 10 MG tablet Take 1 tablet (10 mg total) by mouth at bedtime. 90 tablet 4  . sertraline (ZOLOFT) 100 MG tablet Take 1 tablet (100 mg total) by mouth daily. 180 tablet 0  . topiramate (TOPAMAX) 100 MG tablet TAKE 1.5 TABLETS (150 MG TOTAL) BY MOUTH AT BEDTIME. 45 tablet 3   No current facility-administered medications on file prior to visit.     ALLERGIES: No Known Allergies  FAMILY HISTORY: Family History  Problem Relation Age of Onset  . Hypertension Father   . Heart disease Paternal Grandfather   . Hypertension Paternal Grandfather   . Aortic aneurysm Paternal Grandfather   . Diabetes Mother   . Diabetes Maternal Uncle     x2  . Colon cancer Neg Hx   .  Colon polyps Neg Hx   . Esophageal cancer Neg Hx   . Kidney disease Neg Hx   . Gallbladder disease Neg Hx     SOCIAL HISTORY: Social History   Social History  . Marital status: Single    Spouse name: N/A  . Number of children: 0  . Years of education: N/A   Occupational History  . Student     [employer]   Social History Main Topics  . Smoking status: Never Smoker  . Smokeless tobacco: Never Used  . Alcohol use No  . Drug use: No  . Sexual activity: No   Other Topics Concern  . Not on file   Social History Narrative   Lives in  [place] family. Pets - dog and cat.      School - [employer], rising senior. Plans to major in accountant.      Diet - regular, tries to limit processed sugars      Exercise - started basketball, swimming    REVIEW OF SYSTEMS: Constitutional: No fevers, chills, or sweats, no generalized fatigue, change in appetite Eyes: No visual changes, double vision, eye pain Ear, nose and throat: No hearing loss, ear pain, nasal congestion, sore throat Cardiovascular: No chest pain, palpitations Respiratory:  No shortness of breath at rest or with exertion, wheezes GastrointestinaI: No nausea, vomiting, diarrhea, abdominal pain, fecal incontinence Genitourinary:  No dysuria, urinary retention or frequency Musculoskeletal:  No neck pain, back pain Integumentary: No rash, pruritus, skin lesions Neurological: as above Psychiatric: No depression, insomnia, anxiety Endocrine: No palpitations, fatigue, diaphoresis, mood swings, change in appetite, change in weight, increased thirst Hematologic/Lymphatic:  No purpura, petechiae. Allergic/Immunologic: no itchy/runny eyes, nasal congestion, recent allergic reactions, rashes  PHYSICAL EXAM: Vitals:   10/24/16 0736  BP: 130/82  Pulse: 90   General: No acute distress.  Patient appears well-groomed.  Morbidly obese body habitus. Head:  Normocephalic/atraumatic Eyes:  Fundi examined but not visualized Neck: supple, no paraspinal tenderness, full range of motion Heart:  Regular rate and rhythm Lungs:  Clear to auscultation bilaterally Back: No paraspinal tenderness Neurological Exam: alert and oriented to person, place, and time. Attention span and concentration intact, recent and remote memory intact, fund of knowledge intact.  Speech fluent and not dysarthric, language intact.  CN II-XII intact. Bulk and tone normal, muscle strength 5/5 throughout.  Sensation to light touch  intact.  Deep tendon reflexes 2+ throughout.  Finger to nose testing  intact.  Gait normal  IMPRESSION: Migraine Morbid obesity  PLAN: 1.  Continue topiramate 150mg  at bedtime 2.  Take Tylenol if needed. 3.  Limit use of pain relievers to no more than 2 days out of the week.  These medications include acetaminophen, ibuprofen, triptans and narcotics.  This will help reduce risk of rebound headaches. 4.  Be aware of common food triggers such as processed sweets, processed foods with nitrites (such as deli meat, hot dogs, sausages), foods with MSG, alcohol (such as wine), chocolate, certain cheeses, certain fruits (dried fruits, some citrus fruit), vinegar, diet soda. 4.  Avoid caffeine 5.  Routine exercise 6.  Proper sleep hygiene 7.  Stay adequately hydrated with water 8.  Keep a headache diary. 9.  Maintain proper stress management. 10.  Maintain healthy diet.  Work on weight loss.  Do not skip meals.   11.  Consider supplements:  Magnesium citrate 400mg  to 600mg  daily, riboflavin 400mg , Coenzyme Q 10 100mg  three times daily 12.  Follow up in 6 months.  Shon Millet, DO  CC: Mar Daring. Darrick Huntsman, MD

## 2016-10-24 NOTE — Patient Instructions (Signed)
Migraine Recommendations: 1.  Continue topiramate 150mg  at bedtime 2.  Take Tylenol if needed. 3.  Limit use of pain relievers to no more than 2 days out of the week.  These medications include acetaminophen, ibuprofen, triptans and narcotics.  This will help reduce risk of rebound headaches. 4.  Be aware of common food triggers such as processed sweets, processed foods with nitrites (such as deli meat, hot dogs, sausages), foods with MSG, alcohol (such as wine), chocolate, certain cheeses, certain fruits (dried fruits, some citrus fruit), vinegar, diet soda. 4.  Avoid caffeine 5.  Routine exercise 6.  Proper sleep hygiene 7.  Stay adequately hydrated with water 8.  Keep a headache diary. 9.  Maintain proper stress management. 10.  Maintain healthy diet.  Work on weight loss.  Do not skip meals.   11.  Consider supplements:  Magnesium citrate 400mg  to 600mg  daily, riboflavin 400mg , Coenzyme Q 10 100mg  three times daily 12.  Follow up in 6 months.

## 2016-11-08 ENCOUNTER — Encounter: Payer: Self-pay | Admitting: Internal Medicine

## 2016-11-17 ENCOUNTER — Encounter: Payer: Self-pay | Admitting: Hematology and Oncology

## 2016-11-19 ENCOUNTER — Encounter: Payer: Self-pay | Admitting: Internal Medicine

## 2016-12-16 ENCOUNTER — Other Ambulatory Visit: Payer: BLUE CROSS/BLUE SHIELD

## 2016-12-16 ENCOUNTER — Ambulatory Visit (INDEPENDENT_AMBULATORY_CARE_PROVIDER_SITE_OTHER): Payer: BLUE CROSS/BLUE SHIELD | Admitting: Internal Medicine

## 2016-12-16 ENCOUNTER — Encounter: Payer: Self-pay | Admitting: Internal Medicine

## 2016-12-16 VITALS — BP 130/82 | HR 94 | Temp 98.0°F | Wt >= 6400 oz

## 2016-12-16 DIAGNOSIS — K760 Fatty (change of) liver, not elsewhere classified: Secondary | ICD-10-CM | POA: Diagnosis not present

## 2016-12-16 DIAGNOSIS — F4521 Hypochondriasis: Secondary | ICD-10-CM

## 2016-12-16 DIAGNOSIS — R600 Localized edema: Secondary | ICD-10-CM

## 2016-12-16 DIAGNOSIS — R748 Abnormal levels of other serum enzymes: Secondary | ICD-10-CM

## 2016-12-16 LAB — COMPREHENSIVE METABOLIC PANEL
ALT: 57 U/L — AB (ref 0–53)
AST: 32 U/L (ref 0–37)
Albumin: 4 g/dL (ref 3.5–5.2)
Alkaline Phosphatase: 53 U/L (ref 39–117)
BILIRUBIN TOTAL: 0.5 mg/dL (ref 0.2–1.2)
BUN: 13 mg/dL (ref 6–23)
CO2: 24 meq/L (ref 19–32)
CREATININE: 0.92 mg/dL (ref 0.40–1.50)
Calcium: 9.3 mg/dL (ref 8.4–10.5)
Chloride: 108 mEq/L (ref 96–112)
GFR: 110.73 mL/min (ref >60.00)
GLUCOSE: 120 mg/dL — AB (ref 70–99)
Potassium: 3.6 mEq/L (ref 3.5–5.1)
Sodium: 139 mEq/L (ref 135–145)
Total Protein: 6.8 g/dL (ref 6.0–8.3)

## 2016-12-16 LAB — MICROALBUMIN / CREATININE URINE RATIO
Creatinine,U: 355.4 mg/dL
MICROALB/CREAT RATIO: 0.6 mg/g (ref 0.0–30.0)
Microalb, Ur: 2 mg/dL — ABNORMAL HIGH (ref 0.0–1.9)

## 2016-12-16 NOTE — Progress Notes (Signed)
Subjective:  Patient ID: Larry Rogers, male    DOB: 1996-06-02  Age: 21 y.o. MRN: [id number]  CC: The primary encounter diagnosis was Elevated liver enzymes. Diagnoses of Bilateral lower extremity edema, Morbid obesity ([place]), Hypochondriasis (or hypochondriacal neurosis), and Hepatic steatosis were also pertinent to this visit.  HPI Larry Rogers presents for evaluation of lower extremity swelling. He is accompanoied by his mother today.  The ankle swelling is accompanied by color changes to toes and calf toes for the past week . He denies pain including cramping in calves.  No history of DVT/PE.  No recent travel.  Has lost 6 lbs since feb 8  (however, the chart weights are from two different offices ) by changing his eating habits,  Has cut out sodas and sweet tea and eating more wraps from "[employer].   Outpatient Medications Prior to Visit  Medication Sig Dispense Refill  . busPIRone (BUSPAR) 7.5 MG tablet Take 1 tablet (7.5 mg total) by mouth 3 (three) times daily. 90 tablet 2  . clonazePAM (KLONOPIN) 0.5 MG tablet Take 1 tablet (0.5 mg total) by mouth at bedtime as needed. 30 tablet 5  . ergocalciferol (VITAMIN D2) 50000 units capsule Take 50,000 Units by mouth once a week.    Marland Kitchen lisinopril (PRINIVIL,ZESTRIL) 10 MG tablet TAKE 1 TABLET (10 MG TOTAL) BY MOUTH DAILY. 90 tablet 1  . metoprolol succinate (TOPROL-XL) 25 MG 24 hr tablet Take 1 tablet (25 mg total) by mouth daily. 90 tablet 3  . montelukast (SINGULAIR) 10 MG tablet Take 1 tablet (10 mg total) by mouth at bedtime. 90 tablet 4  . sertraline (ZOLOFT) 100 MG tablet Take 1 tablet (100 mg total) by mouth daily. 180 tablet 0  . topiramate (TOPAMAX) 100 MG tablet TAKE 1.5 TABLETS (150 MG TOTAL) BY MOUTH AT BEDTIME. 45 tablet 3   No facility-administered medications prior to visit.     Review of Systems;  Patient denies headache, fevers, malaise, unintentional weight loss, skin rash, eye pain, sinus congestion and sinus pain, sore  throat, dysphagia,  hemoptysis , cough, dyspnea, wheezing, chest pain, palpitations, orthopnea,, abdominal pain, nausea, melena, diarrhea, constipation, flank pain, dysuria, hematuria, urinary  Frequency, nocturia, numbness, tingling, seizures,  Focal weakness, Loss of consciousness,  Tremor, insomnia, depression, and suicidal ideation.      Objective:  BP 130/82   Pulse 94   Temp 98 F (36.7 C) (Oral)   Wt (!) 465 lb (210.9 kg)   SpO2 97%   BMI 58.12 kg/m   BP Readings from Last 3 Encounters:  12/16/16 130/82  10/24/16 130/82  10/10/16 100/80    Wt Readings from Last 3 Encounters:  12/16/16 (!) 465 lb (210.9 kg)  10/24/16 (!) 471 lb 1 oz (213.7 kg)  10/10/16 (!) 466 lb 8 oz (211.6 kg)    General appearance: alert, anxious cooperative and appears stated age Neck: no adenopathy, no carotid bruit, supple, symmetrical, trachea midline and thyroid not enlarged, symmetric, no tenderness/mass/nodules Back: symmetric, no curvature. ROM normal. No CVA tenderness. Lungs: clear to auscultation bilaterally Heart: regular rate and rhythm, S1, S2 normal, no murmur, click, rub or gallop Abdomen: soft, non-tender; bowel sounds normal; no masses,  no organomegaly Pulses: 2+ and symmetric, cap refill < 3 sec  Skin: Skin color, texture, turgor normal. No rashes or lesions.  No cyanosis  Ext: non pitting  Edema. No lumps,  Negative homan's sign bilaterally  Lymph nodes: Cervical, supraclavicular, and axillary nodes normal.  Lab Results  Component Value Date   HGBA1C 5.0 06/21/2014    Lab Results  Component Value Date   CREATININE 0.92 12/16/2016   CREATININE 1.06 07/25/2016   CREATININE 0.95 07/01/2016    Lab Results  Component Value Date   WBC 7.1 07/25/2016   HGB 15.6 07/25/2016   HCT 44.2 07/25/2016   PLT 283 07/25/2016   GLUCOSE 120 (H) 12/16/2016   CHOL 137 06/09/2015   TRIG 177 (A) 06/09/2015   HDL 29 (A) 06/09/2015   LDLCALC 73 06/09/2015   ALT 57 (H) 12/16/2016   AST  32 12/16/2016   NA 139 12/16/2016   K 3.6 12/16/2016   CL 108 12/16/2016   CREATININE 0.92 12/16/2016   BUN 13 12/16/2016   CO2 24 12/16/2016   TSH 2.73 09/02/2015   HGBA1C 5.0 06/21/2014   MICROALBUR 2.0 (H) 12/16/2016    Assessment & Plan:   Problem List Items Addressed This Visit    Bilateral lower extremity edema    likely venous insufficiency.  Referral to vascular for venous ultrasounds of both legs. Encourage to be more active,  start a daily walking program        Relevant Orders   Microalbumin / creatinine urine ratio (Completed)   Ambulatory referral to Vascular Surgery   Hepatic steatosis    Suggested by ct of abd done Dec 2017.  Liver enzymes have been elevated but are improving with dietary changes. Will recommend Hepatitis a and b vaccines      Hypochondriasis (or hypochondriacal neurosis)    His complaint of ankle swelling is recurrent and unaccompanied by any changes that suggest he has developed a dvt.        Morbid obesity (HCC)    He has not made any significant progress in losing weight and is not a candidate for bariatric surgery due to insurance non coverage (as well as due to  his uncontrolled anxiety and other psychiatric issues) .  Will discuss referral to dr beasley at next visit        Other Visit Diagnoses    Elevated liver enzymes    -  Primary   Relevant Orders   Comp Met (CMET) (Completed)      I am having Mr. Halbig maintain his montelukast, clonazePAM, ergocalciferol, lisinopril, sertraline, topiramate, metoprolol succinate, and busPIRone.  No orders of the defined types were placed in this encounter.   There are no discontinued medications.  Follow-up: No Follow-up on file.   ,  L, MD 

## 2016-12-17 DIAGNOSIS — R600 Localized edema: Secondary | ICD-10-CM | POA: Insufficient documentation

## 2016-12-17 DIAGNOSIS — K760 Fatty (change of) liver, not elsewhere classified: Secondary | ICD-10-CM | POA: Insufficient documentation

## 2016-12-17 NOTE — Assessment & Plan Note (Signed)
His complaint of ankle swelling is recurrent and unaccompanied by any changes that suggest he has developed a dvt.

## 2016-12-17 NOTE — Assessment & Plan Note (Signed)
He has not made any significant progress in losing weight and is not a candidate for bariatric surgery due to insurance non coverage (as well as due to  his uncontrolled anxiety and other psychiatric issues) .  Will discuss referral to dr Dalbert Garnet at next visit

## 2016-12-17 NOTE — Assessment & Plan Note (Signed)
likely venous insufficiency.  Referral to vascular for venous ultrasounds of both legs. Encourage to be more active,  start a daily walking program

## 2016-12-17 NOTE — Assessment & Plan Note (Addendum)
Suggested by ct of abd done Dec 2017.  Liver enzymes have been elevated but are improving with dietary changes. Will recommend Hepatitis a and b vaccines

## 2016-12-20 ENCOUNTER — Encounter: Payer: Self-pay | Admitting: Internal Medicine

## 2017-01-08 ENCOUNTER — Ambulatory Visit: Payer: BLUE CROSS/BLUE SHIELD | Admitting: Internal Medicine

## 2017-01-13 ENCOUNTER — Ambulatory Visit (INDEPENDENT_AMBULATORY_CARE_PROVIDER_SITE_OTHER): Payer: BLUE CROSS/BLUE SHIELD | Admitting: Internal Medicine

## 2017-01-13 ENCOUNTER — Encounter: Payer: Self-pay | Admitting: Internal Medicine

## 2017-01-13 DIAGNOSIS — F411 Generalized anxiety disorder: Secondary | ICD-10-CM

## 2017-01-13 DIAGNOSIS — R600 Localized edema: Secondary | ICD-10-CM

## 2017-01-13 NOTE — Patient Instructions (Addendum)
I AM IMPRESSED!    YOU ARE MAKING ALL THE RIGHT CHOICES  Keep up the great work!!  I'LL SEE YOU IN A MONTH

## 2017-01-13 NOTE — Progress Notes (Signed)
Subjective:  Patient ID: Larry Rogers, male    DOB: [birth year]  Age: 21 y.o. MRN: [id number]  CC: Diagnoses of Bilateral lower extremity edema, GAD (generalized anxiety disorder), and Morbid obesity (HCC) were pertinent to this visit.  HPI Larry Rogers presents for follow up  on mobid obesity complicated by GAD now managed by Dr Maryruth Bun   Has LOST 1 LB OVER THE PAST MONTH,  7 TOTAL SINCE FEB 8TH   HAS STARTED A WALKING PROGRAM A FEW DAYS A WEEK WITH SISTER [name].  STARTED WITH 7-98 MINUTES,  UP TO 10 MINUTES.  SOME KNEE PAIN AFTER WALKING   HAS REPLACED SODAS  WITH DIET SODAS FOR THE PAST MONTHS COOKING AT HOME.  NO JUNK FOOD, EATING FRUIT FOR SNACKS.   HAS BEEN OFF CLONAZEPAM FOR THE PAST 2 MONTHS.  USING BUSPRIONE 2/DAILY     Outpatient Medications Prior to Visit  Medication Sig Dispense Refill  . busPIRone (BUSPAR) 7.5 MG tablet Take 1 tablet (7.5 mg total) by mouth 3 (three) times daily. 90 tablet 2  . ergocalciferol (VITAMIN D2) 50000 units capsule Take 50,000 Units by mouth once a week.    Marland Kitchen lisinopril (PRINIVIL,ZESTRIL) 10 MG tablet TAKE 1 TABLET (10 MG TOTAL) BY MOUTH DAILY. 90 tablet 1  . metoprolol succinate (TOPROL-XL) 25 MG 24 hr tablet Take 1 tablet (25 mg total) by mouth daily. 90 tablet 3  . montelukast (SINGULAIR) 10 MG tablet Take 1 tablet (10 mg total) by mouth at bedtime. 90 tablet 4  . sertraline (ZOLOFT) 100 MG tablet Take 1 tablet (100 mg total) by mouth daily. 180 tablet 0  . topiramate (TOPAMAX) 100 MG tablet TAKE 1.5 TABLETS (150 MG TOTAL) BY MOUTH AT BEDTIME. 45 tablet 3  . clonazePAM (KLONOPIN) 0.5 MG tablet Take 1 tablet (0.5 mg total) by mouth at bedtime as needed. (Patient not taking: Reported on 01/13/2017) 30 tablet 5   No facility-administered medications prior to visit.     Review of Systems;  Patient denies headache, fevers, malaise, unintentional weight loss, skin rash, eye pain, sinus congestion and sinus pain, sore throat, dysphagia,   hemoptysis , cough, dyspnea, wheezing, chest pain, palpitations, orthopnea, edema, abdominal pain, nausea, melena, diarrhea, constipation, flank pain, dysuria, hematuria, urinary  Frequency, nocturia, numbness, tingling, seizures,  Focal weakness, Loss of consciousness,  Tremor, insomnia, depression, anxiety, and suicidal ideation.      Objective:  BP 118/78 (BP Location: Left Arm, Patient Position: Sitting, Cuff Size: Large)   Pulse (!) 102   Temp 98.1 F (36.7 C) (Oral)   Resp 17   Ht  (1.905 m)   Wt (!) 464 lb 9.6 oz (210.7 kg)   SpO2 97%   BMI 58.07 kg/m   BP Readings from Last 3 Encounters:  01/13/17 118/78  12/16/16 130/82  10/24/16 130/82    Wt Readings from Last 3 Encounters:  01/13/17 (!) 464 lb 9.6 oz (210.7 kg)  12/16/16 (!) 465 lb (210.9 kg)  10/24/16 (!) 471 lb 1 oz (213.7 kg)    General appearance: alert, cooperative and appears stated age Ears: normal TM's and external ear canals both ears Throat: lips, mucosa, and tongue normal; teeth and gums normal Neck: no adenopathy, no carotid bruit, supple, symmetrical, trachea midline and thyroid not enlarged, symmetric, no tenderness/mass/nodules Back: symmetric, no curvature. ROM normal. No CVA tenderness. Lungs: clear to auscultation bilaterally Heart: regular rate and rhythm, S1, S2 normal, no murmur, click, rub or gallop Abdomen: soft, non-tender;  bowel sounds normal; no masses,  no organomegaly Pulses: 2+ and symmetric Skin: Skin color, texture, turgor normal. No rashes or lesions Lymph nodes: Cervical, supraclavicular, and axillary nodes normal.  Lab Results  Component Value Date   HGBA1C 5.0 06/21/2014    Lab Results  Component Value Date   CREATININE 0.92 12/16/2016   CREATININE 1.06 07/25/2016   CREATININE 0.95 07/01/2016    Lab Results  Component Value Date   WBC 7.1 07/25/2016   HGB 15.6 07/25/2016   HCT 44.2 07/25/2016   PLT 283 07/25/2016   GLUCOSE 120 (H) 12/16/2016   CHOL 137  06/09/2015   TRIG 177 (A) 06/09/2015   HDL 29 (A) 06/09/2015   LDLCALC 73 06/09/2015   ALT 57 (H) 12/16/2016   AST 32 12/16/2016   NA 139 12/16/2016   K 3.6 12/16/2016   CL 108 12/16/2016   CREATININE 0.92 12/16/2016   BUN 13 12/16/2016   CO2 24 12/16/2016   TSH 2.73 09/02/2015   HGBA1C 5.0 06/21/2014   MICROALBUR 2.0 (H) 12/16/2016    Ct Abdomen Pelvis W Contrast  Result Date: 09/13/2016 CLINICAL DATA:  Patient with history of lymphadenopathy. EXAM: CT ABDOMEN AND PELVIS WITH CONTRAST TECHNIQUE: Multidetector CT imaging of the abdomen and pelvis was performed using the standard protocol following bolus administration of intravenous contrast. CONTRAST:  125 cc Isovue 370 COMPARISON:  CT abdomen pelvis 07/01/2016. FINDINGS: Lower chest: Normal heart size. No consolidative pulmonary opacities. No pleural effusion. Hepatobiliary: Liver is diffusely low in attenuation compatible with steatosis. No focal hepatic lesion is identified. Gallbladder is decompressed. Pancreas: Unremarkable Spleen: Unremarkable Adrenals/Urinary Tract: The adrenal glands are normal. Kidneys enhance symmetrically with contrast. No hydronephrosis. Urinary bladder is unremarkable. Stomach/Bowel: Stomach is within normal limits. Appendix appears normal. No evidence of bowel wall thickening, distention, or inflammatory changes. Vascular/Lymphatic: Normal caliber abdominal aorta. Interval decrease in previously described retroperitoneal adenopathy with a reference lymph node measuring 11 mm (image 48; series 2), previously 13 mm. Adjacent aortocaval lymph node measures 8 mm (image 46; series 2), previously 14 mm. Peripancreatic lymph node measures 13 mm, previously 16 mm (image 27; series 2). Reproductive: Prostate is unremarkable. Other: No abdominal wall hernia or abnormality. No abdominopelvic ascites. Musculoskeletal: No aggressive or acute appearing osseous lesions. IMPRESSION: Interval decrease in size of previously  described prominent retroperitoneal lymph nodes, favored to be secondary to an improving reactive process. Hepatic steatosis. Electronically Signed   By: Annia Belt M.D.   On: 09/13/2016 09:53    Assessment & Plan:   Problem List Items Addressed This Visit    Bilateral lower extremity edema    Secondary to morbid obesity and venous stasis.  Continue daily walking,  Weight loss       GAD (generalized anxiety disorder)    No longer managed with lorazepam.  using buspirone       Morbid obesity (HCC)    He has lost 4 lbs since his last visit through dietary changes and IS NOT  A candidate for bariatric surgery due to insurance non coverage (as well as due to  his uncontrolled anxiety and other psychiatric issues) . Continue current regimen,  Return one month.         A total of 25 minutes of face to face time was spent with patient more than half of which was spent in counselling about the above mentioned conditions  and coordination of care   I have discontinued Mr. Alderman clonazePAM. I am also having him  maintain his montelukast, ergocalciferol, lisinopril, sertraline, topiramate, metoprolol succinate, and busPIRone.  No orders of the defined types were placed in this encounter.   Medications Discontinued During This Encounter  Medication Reason  . clonazePAM (KLONOPIN) 0.5 MG tablet     Follow-up: Return in about 4 weeks (around 02/10/2017) for 15 minute visit ok .   Sherlene Shams, MD

## 2017-01-14 NOTE — Assessment & Plan Note (Signed)
No longer managed with lorazepam.  using buspirone

## 2017-01-14 NOTE — Assessment & Plan Note (Signed)
He has lost 4 lbs since his last visit through dietary changes and IS NOT  A candidate for bariatric surgery due to insurance non coverage (as well as due to  his uncontrolled anxiety and other psychiatric issues) . Continue current regimen,  Return one month.

## 2017-01-14 NOTE — Assessment & Plan Note (Signed)
Secondary to morbid obesity and venous stasis.  Continue daily walking,  Weight loss

## 2017-01-20 ENCOUNTER — Other Ambulatory Visit: Payer: Self-pay | Admitting: Neurology

## 2017-01-21 ENCOUNTER — Other Ambulatory Visit: Payer: Self-pay | Admitting: Internal Medicine

## 2017-02-17 ENCOUNTER — Encounter: Payer: Self-pay | Admitting: Internal Medicine

## 2017-02-17 ENCOUNTER — Ambulatory Visit (INDEPENDENT_AMBULATORY_CARE_PROVIDER_SITE_OTHER): Payer: BLUE CROSS/BLUE SHIELD | Admitting: Internal Medicine

## 2017-02-17 VITALS — BP 118/82 | HR 104 | Temp 97.9°F | Resp 17 | Ht 75.0 in | Wt >= 6400 oz

## 2017-02-17 DIAGNOSIS — H7291 Unspecified perforation of tympanic membrane, right ear: Secondary | ICD-10-CM | POA: Diagnosis not present

## 2017-02-17 DIAGNOSIS — H6691 Otitis media, unspecified, right ear: Secondary | ICD-10-CM | POA: Diagnosis not present

## 2017-02-17 DIAGNOSIS — J4521 Mild intermittent asthma with (acute) exacerbation: Secondary | ICD-10-CM | POA: Diagnosis not present

## 2017-02-17 DIAGNOSIS — H66011 Acute suppurative otitis media with spontaneous rupture of ear drum, right ear: Secondary | ICD-10-CM

## 2017-02-17 MED ORDER — PREDNISONE 10 MG PO TABS: ORAL_TABLET | ORAL | 0 refills | Status: DC

## 2017-02-17 MED ORDER — LEVALBUTEROL TARTRATE 45 MCG/ACT IN AERO: 2.0000 | INHALATION_SPRAY | Freq: Four times a day (QID) | RESPIRATORY_TRACT | 12 refills | Status: DC | PRN

## 2017-02-17 NOTE — Patient Instructions (Signed)
   I am treating you for bronchitis.  continue the augmentin  Adding prednisone taper and Xopenex inhaler to use for the wheezing  I think you may have ruptured your eardrum on the right so I am referring you to ENT for further evaluation   Taking an antibiotic can create an imbalance in the normal population of bacteria that live in the small intestine.  This imbalance can persist for 3 months.   Taking a probiotic ( Align, Floraque or Culturelle), the generic version of one of these over the counter medications, or an alternative form (kombucha,  Yogurt, or another dietary source) for a minimum of 3 weeks may help prevent a serious antibiotic associated diarrhea  Called clostridium dificile colitis that occurs when the bacteria population is altered .  Taking a probiotic may also prevent thrush  due to yeast infections and can be continued indefinitely if you feel that it improves your digestion or your elimination (bowels).

## 2017-02-17 NOTE — Progress Notes (Signed)
Subjective:  Patient ID: Larry Rogers, male    DOB: Jan 17, 1996  Age: 21 y.o. MRN: [id number]  CC: The primary encounter diagnosis was Acute suppurative otitis media of right ear with spontaneous rupture of tympanic membrane, recurrence not specified. Diagnoses of Mild intermittent asthma with exacerbation and Otitis media of right ear with spontaneous rupture of tympanic membrane were also pertinent to this visit.  HPI Larry Peacocklston C Dobbs presents for follow up on GAD,  Morbid obesity   Weight gain of 2 lbs since last visit . Went on a cruise with his parents ate constantly but did a lot ow walking on the cruise ship,  Legs are feeling good and ankle swelling has diminished.    Right sided hearing loss.  After returning home developed a sore throat,  Sinus congestion, then right ear popped and hearing has been muffled ever since on the right sided.  Was treated by an RN at his mother's office with Augmentin ,  Has been taking it  For 3-4 days,  And sore throat has improved but he continues to feel that his sinuses and chest are full and he reports increased cough at night,  At times productive,  And wheezing.   Stools have been soft but denies diarrhea,  Not taking a probiotic.    Outpatient Medications Prior to Visit  Medication Sig Dispense Refill  . busPIRone (BUSPAR) 7.5 MG tablet Take 1 tablet (7.5 mg total) by mouth 3 (three) times daily. 90 tablet 2  . ergocalciferol (VITAMIN D2) 50000 units capsule Take 50,000 Units by mouth once a week.    Marland Kitchen. lisinopril (PRINIVIL,ZESTRIL) 10 MG tablet TAKE 1 TABLET (10 MG TOTAL) BY MOUTH DAILY. 90 tablet 1  . metoprolol succinate (TOPROL-XL) 25 MG 24 hr tablet Take 1 tablet (25 mg total) by mouth daily. 90 tablet 3  . montelukast (SINGULAIR) 10 MG tablet Take 1 tablet (10 mg total) by mouth at bedtime. 90 tablet 4  . sertraline (ZOLOFT) 100 MG tablet Take 1 tablet (100 mg total) by mouth daily. 180 tablet 0  . topiramate (TOPAMAX) 100 MG tablet TAKE 1.5  TABLETS (150 MG TOTAL) BY MOUTH AT BEDTIME. 45 tablet 3   No facility-administered medications prior to visit.     Review of Systems;  Patient denies headache, fevers, malaise, unintentional weight loss, skin rash, eye pain,sinus pain, sore throat, dysphagia,  hemoptysis ,  dyspnea,  chest pain, palpitations, orthopnea, edema, abdominal pain, nausea, melena, diarrhea, constipation, flank pain, dysuria, hematuria, urinary  Frequency, nocturia, numbness, tingling, seizures,  Focal weakness, Loss of consciousness,  Tremor, insomnia, depression, anxiety, and suicidal ideation.      Objective:  BP 118/82 (BP Location: Left Arm, Patient Position: Sitting, Cuff Size: Large)   Pulse (!) 104   Temp 97.9 F (36.6 C) (Oral)   Resp 17   Ht 6\' 3"  (1.905 m)   Wt (!) 466 lb 9.6 oz (211.6 kg)   SpO2 95%   BMI 58.32 kg/m   BP Readings from Last 3 Encounters:  02/17/17 118/82  01/13/17 118/78  12/16/16 130/82    Wt Readings from Last 3 Encounters:  02/17/17 (!) 466 lb 9.6 oz (211.6 kg)  01/13/17 (!) 464 lb 9.6 oz (210.7 kg)  12/16/16 (!) 465 lb (210.9 kg)    General appearance: alert, cooperative and appears stated age Ears: Right TM slight bulge, erythematous,  [place] has dried blood in it.  Left TM normal and external ear canals both ears Throat: mild  tonsillar erythema without edema . Neck : no adenopathy, no carotid bruit, supple, symmetrical, trachea midline and thyroid not enlarged, symmetric, no tenderness/mass/nodules Back: symmetric, no curvature. ROM normal. No CVA tenderness. Lungs: bilateral wheezing with good air movement. No egophony Heart: regular rate and rhythm, S1, S2 normal, no murmur, click, rub or gallop Abdomen: soft, non-tender; bowel sounds normal; no masses,  no organomegaly Pulses: 2+ and symmetric Skin: Skin color, texture, turgor normal. No rashes or lesions Lymph nodes: Cervical, supraclavicular, and axillary nodes normal.  Lab Results  Component Value Date     HGBA1C 5.0 06/21/2014    Lab Results  Component Value Date   CREATININE 0.92 12/16/2016   CREATININE 1.06 07/25/2016   CREATININE 0.95 07/01/2016    Lab Results  Component Value Date   WBC 7.1 07/25/2016   HGB 15.6 07/25/2016   HCT 44.2 07/25/2016   PLT 283 07/25/2016   GLUCOSE 120 (H) 12/16/2016   CHOL 137 06/09/2015   TRIG 177 (A) 06/09/2015   HDL 29 (A) 06/09/2015   LDLCALC 73 06/09/2015   ALT 57 (H) 12/16/2016   AST 32 12/16/2016   NA 139 12/16/2016   K 3.6 12/16/2016   CL 108 12/16/2016   CREATININE 0.92 12/16/2016   BUN 13 12/16/2016   CO2 24 12/16/2016   TSH 2.73 09/02/2015   HGBA1C 5.0 06/21/2014   MICROALBUR 2.0 (H) 12/16/2016    Ct Abdomen Pelvis W Contrast  Result Date: 09/13/2016 CLINICAL DATA:  Patient with history of lymphadenopathy. EXAM: CT ABDOMEN AND PELVIS WITH CONTRAST TECHNIQUE: Multidetector CT imaging of the abdomen and pelvis was performed using the standard protocol following bolus administration of intravenous contrast. CONTRAST:  125 cc Isovue 370 COMPARISON:  CT abdomen pelvis 07/01/2016. FINDINGS: Lower chest: Normal heart size. No consolidative pulmonary opacities. No pleural effusion. Hepatobiliary: Liver is diffusely low in attenuation compatible with steatosis. No focal hepatic lesion is identified. Gallbladder is decompressed. Pancreas: Unremarkable Spleen: Unremarkable Adrenals/Urinary Tract: The adrenal glands are normal. Kidneys enhance symmetrically with contrast. No hydronephrosis. Urinary bladder is unremarkable. Stomach/Bowel: Stomach is within normal limits. Appendix appears normal. No evidence of bowel wall thickening, distention, or inflammatory changes. Vascular/Lymphatic: Normal caliber abdominal aorta. Interval decrease in previously described retroperitoneal adenopathy with a reference lymph node measuring 11 mm (image 48; series 2), previously 13 mm. Adjacent aortocaval lymph node measures 8 mm (image 46; series 2), previously  14 mm. Peripancreatic lymph node measures 13 mm, previously 16 mm (image 27; series 2). Reproductive: Prostate is unremarkable. Other: No abdominal wall hernia or abnormality. No abdominopelvic ascites. Musculoskeletal: No aggressive or acute appearing osseous lesions. IMPRESSION: Interval decrease in size of previously described prominent retroperitoneal lymph nodes, favored to be secondary to an improving reactive process. Hepatic steatosis. Electronically Signed   By: Annia Belt M.D.   On: 09/13/2016 09:53    Assessment & Plan:   Problem List Items Addressed This Visit    Otitis media of right ear with spontaneous rupture of tympanic membrane    Continue augmentin ,  Adding prednisone for persistent inflammation. Referring to ENT for evaluation given persistent hearing loss       Relevant Medications   amoxicillin-clavulanate (AUGMENTIN) 875-125 MG tablet   Asthma exacerbation    With current exacerbation secondary to viral infection with otitis.  Adding prednisone taper and xopenex MDI       Relevant Medications   predniSONE (DELTASONE) 10 MG tablet   levalbuterol (XOPENEX HFA) 45 MCG/ACT inhaler  Other Visit Diagnoses    Acute suppurative otitis media of right ear with spontaneous rupture of tympanic membrane, recurrence not specified    -  Primary   Relevant Medications   amoxicillin-clavulanate (AUGMENTIN) 875-125 MG tablet   Other Relevant Orders   Ambulatory referral to ENT      I am having Mr. Runyon start on predniSONE and levalbuterol. I am also having him maintain his montelukast, ergocalciferol, sertraline, metoprolol succinate, busPIRone, topiramate, lisinopril, and amoxicillin-clavulanate.  Meds ordered this encounter  Medications  . amoxicillin-clavulanate (AUGMENTIN) 875-125 MG tablet    Sig: TAKE 1 TABLET BY MOUTH EVERY 12 HOURS FOR 7 DAYS    Refill:  0  . predniSONE (DELTASONE) 10 MG tablet    Sig: 6 tablets on Day 1 , then reduce by 1 tablet daily until  gone    Dispense:  21 tablet    Refill:  0  . levalbuterol (XOPENEX HFA) 45 MCG/ACT inhaler    Sig: Inhale 2 puffs into the lungs every 6 (six) hours as needed for wheezing.    Dispense:  1 Inhaler    Refill:  12    There are no discontinued medications.  Follow-up: No Follow-up on file.   Sherlene Shams, MD

## 2017-02-18 DIAGNOSIS — H7291 Unspecified perforation of tympanic membrane, right ear: Secondary | ICD-10-CM

## 2017-02-18 DIAGNOSIS — H6691 Otitis media, unspecified, right ear: Secondary | ICD-10-CM | POA: Insufficient documentation

## 2017-02-18 NOTE — Assessment & Plan Note (Signed)
Continue augmentin ,  Adding prednisone for persistent inflammation. Referring to ENT for evaluation given persistent hearing loss

## 2017-02-18 NOTE — Assessment & Plan Note (Signed)
With current exacerbation secondary to viral infection with otitis.  Adding prednisone taper and xopenex MDI

## 2017-03-18 ENCOUNTER — Telehealth: Payer: Self-pay

## 2017-03-18 MED ORDER — TOPIRAMATE 100 MG PO TABS: 150.0000 mg | ORAL_TABLET | Freq: Every day | ORAL | 1 refills | Status: DC

## 2017-03-18 NOTE — Telephone Encounter (Signed)
Sent 90 day supply of Topiramate to [facility]

## 2017-04-18 ENCOUNTER — Encounter: Payer: Self-pay | Admitting: Internal Medicine

## 2017-04-18 MED ORDER — SERTRALINE HCL 100 MG PO TABS: 100.0000 mg | ORAL_TABLET | Freq: Every day | ORAL | 0 refills | Status: DC

## 2017-04-18 MED ORDER — BUSPIRONE HCL 7.5 MG PO TABS: 7.5000 mg | ORAL_TABLET | Freq: Three times a day (TID) | ORAL | 2 refills | Status: DC

## 2017-04-18 NOTE — Telephone Encounter (Signed)
Medication has been refilled. As  per Dr. Darrick Huntsmanullo verbal.

## 2017-04-23 ENCOUNTER — Ambulatory Visit: Payer: BLUE CROSS/BLUE SHIELD | Admitting: Neurology

## 2017-05-18 ENCOUNTER — Other Ambulatory Visit: Payer: Self-pay | Admitting: Internal Medicine

## 2017-05-21 ENCOUNTER — Encounter: Payer: Self-pay | Admitting: Internal Medicine

## 2017-05-21 ENCOUNTER — Ambulatory Visit (INDEPENDENT_AMBULATORY_CARE_PROVIDER_SITE_OTHER): Payer: 59 | Admitting: Internal Medicine

## 2017-05-21 VITALS — BP 124/78 | HR 108 | Temp 98.2°F | Resp 17 | Ht 75.0 in | Wt >= 6400 oz

## 2017-05-21 DIAGNOSIS — F4521 Hypochondriasis: Secondary | ICD-10-CM

## 2017-05-21 DIAGNOSIS — K760 Fatty (change of) liver, not elsewhere classified: Secondary | ICD-10-CM | POA: Diagnosis not present

## 2017-05-21 DIAGNOSIS — Z23 Encounter for immunization: Secondary | ICD-10-CM | POA: Diagnosis not present

## 2017-05-21 DIAGNOSIS — I10 Essential (primary) hypertension: Secondary | ICD-10-CM

## 2017-05-21 DIAGNOSIS — F411 Generalized anxiety disorder: Secondary | ICD-10-CM

## 2017-05-21 MED ORDER — SERTRALINE HCL 100 MG PO TABS: 200.0000 mg | ORAL_TABLET | Freq: Every day | ORAL | 0 refills | Status: DC

## 2017-05-21 MED ORDER — BUSPIRONE HCL 15 MG PO TABS: 15.0000 mg | ORAL_TABLET | Freq: Three times a day (TID) | ORAL | 2 refills | Status: DC

## 2017-05-21 NOTE — Progress Notes (Signed)
Subjective:  Patient ID: Larry Rogers, male    DOB: 1996-08-01  Age: 21 y.o. MRN: [id number]  CC: The primary encounter diagnosis was Hepatic steatosis. Diagnoses of Morbid obesity (HCC), Essential hypertension, benign, Need for immunization against influenza, Hypochondriasis (or hypochondriacal neurosis), and GAD (generalized anxiety disorder) were also pertinent to this visit.   Larry PeacockAlston C Vinal presents for follow up on morbid obesity complicated by GAD managed with zoloft 200 mg daily and buspirone 15 mg.  No longer  On clonazepam.  No longer  seeing Dr Maryruth BunKapur due to failure to keep HIS LAST TWO APPTS (PER June CORRESPONDENCE)  States that he did not feel that Dr Maryruth BunKapur understood him.  Has gained 20 lbs since last visit .  Patient unaware of the weight gain because "my routine has not changed.":  Does not have a job yet.  Living with parents.  NOT EXERCISING,  Still not leaving the house. Spends his entire day watchng tv,  On in the intnernet  Acknowledges that he has to "be a man" and change his life.       Outpatient Medications Prior to Visit  Medication Sig Dispense Refill  . ergocalciferol (VITAMIN D2) 50000 units capsule Take 50,000 Units by mouth once a week.    . levalbuterol (XOPENEX HFA) 45 MCG/ACT inhaler Inhale 2 puffs into the lungs every 6 (six) hours as needed for wheezing. 1 Inhaler 12  . lisinopril (PRINIVIL,ZESTRIL) 10 MG tablet TAKE 1 TABLET (10 MG TOTAL) BY MOUTH DAILY. 90 tablet 1  . metoprolol succinate (TOPROL-XL) 25 MG 24 hr tablet Take 1 tablet (25 mg total) by mouth daily. 90 tablet 3  . montelukast (SINGULAIR) 10 MG tablet Take 1 tablet (10 mg total) by mouth at bedtime. 90 tablet 4  . topiramate (TOPAMAX) 100 MG tablet Take 1.5 tablets (150 mg total) by mouth at bedtime. 135 tablet 1  . busPIRone (BUSPAR) 7.5 MG tablet Take 1 tablet (7.5 mg total) by mouth 3 (three) times daily. 90 tablet 2  . sertraline (ZOLOFT) 100 MG tablet Take 1 tablet (100 mg total) by  mouth daily. 180 tablet 0  . sertraline (ZOLOFT) 50 MG tablet TAKE 2 TABLETS (100 MG TOTAL) BY MOUTH DAILY. 180 tablet 1  . amoxicillin-clavulanate (AUGMENTIN) 875-125 MG tablet TAKE 1 TABLET BY MOUTH EVERY 12 HOURS FOR 7 DAYS  0  . predniSONE (DELTASONE) 10 MG tablet 6 tablets on Day 1 , then reduce by 1 tablet daily until gone (Patient not taking: Reported on 05/21/2017) 21 tablet 0   No facility-administered medications prior to visit.     Review of Systems;  Patient denies headache, fevers, malaise, unintentional weight loss, skin rash, eye pain, sinus congestion and sinus pain, sore throat, dysphagia,  hemoptysis , cough, dyspnea, wheezing, chest pain, palpitations, orthopnea, edema, abdominal pain, nausea, melena, diarrhea, constipation, flank pain, dysuria, hematuria, urinary  Frequency, nocturia, numbness, tingling, seizures,  Focal weakness, Loss of consciousness,  Tremor, insomnia, depression, anxiety, and suicidal ideation.      Objective:  BP 124/78 (BP Location: Left Arm, Patient Position: Sitting, Cuff Size: Large)   Pulse (!) 108   Temp 98.2 F (36.8 C) (Oral)   Resp 17   Ht 6\' 3"  (1.905 m)   Wt (!) 485 lb 1.9 oz (220 kg)   SpO2 98%   BMI 60.64 kg/m   BP Readings from Last 3 Encounters:  05/21/17 124/78  02/17/17 118/82  01/13/17 118/78    Wt Readings from Last  3 Encounters:  05/21/17 (!) 485 lb 1.9 oz (220 kg)  02/17/17 (!) 466 lb 9.6 oz (211.6 kg)  01/13/17 (!) 464 lb 9.6 oz (210.7 kg)    General appearance: alert, cooperative and appears stated age Ears: normal TM's and external ear canals both ears Throat: lips, mucosa, and tongue normal; teeth and gums normal Neck: no adenopathy, no carotid bruit, supple, symmetrical, trachea midline and thyroid not enlarged, symmetric, no tenderness/mass/nodules Back: symmetric, no curvature. ROM normal. No CVA tenderness. Lungs: clear to auscultation bilaterally Heart: regular rate and rhythm, S1, S2 normal, no murmur,  click, rub or gallop Abdomen: soft, non-tender; bowel sounds normal; no masses,  no organomegaly Pulses: 2+ and symmetric Skin: Skin color, texture, turgor normal. No rashes or lesions Lymph nodes: Cervical, supraclavicular, and axillary nodes normal.  Lab Results  Component Value Date   HGBA1C 5.0 06/21/2014    Lab Results  Component Value Date   CREATININE 0.92 12/16/2016   CREATININE 1.06 07/25/2016   CREATININE 0.95 07/01/2016    Lab Results  Component Value Date   WBC 7.1 07/25/2016   HGB 15.6 07/25/2016   HCT 44.2 07/25/2016   PLT 283 07/25/2016   GLUCOSE 120 (H) 12/16/2016   CHOL 137 06/09/2015   TRIG 177 (A) 06/09/2015   HDL 29 (A) 06/09/2015   LDLCALC 73 06/09/2015   ALT 57 (H) 12/16/2016   AST 32 12/16/2016   NA 139 12/16/2016   K 3.6 12/16/2016   CL 108 12/16/2016   CREATININE 0.92 12/16/2016   BUN 13 12/16/2016   CO2 24 12/16/2016   TSH 2.73 09/02/2015   HGBA1C 5.0 06/21/2014   MICROALBUR 2.0 (H) 12/16/2016      Assessment & Plan:   Problem List Items Addressed This Visit    Essential hypertension, benign   GAD (generalized anxiety disorder)    Now managed with zoloft 200 mg daily and buspirone 15 mg tid. Will refer to local psychology      Relevant Orders   Ambulatory referral to Psychology   Hepatic steatosis - Primary    Secondary to morbid obesity.  Return  In  3 months,  Lipids, hepatic panel and a1c ordered       Relevant Orders   Comprehensive metabolic panel   Lipid panel   Hypochondriasis (or hypochondriacal neurosis)    Reassured patient that he is generally healthy and has no barriers to beginning an exercise program.        Relevant Orders   Ambulatory referral to Psychology   Morbid obesity (HCC)    He has gained 20 lbs because he is not exercising .  He has committed today to walking daily for a minimum of 15 minutes.  Referral to psychology for talk therapy      Relevant Orders   Hemoglobin A1c    Other Visit  Diagnoses    Need for immunization against influenza       Relevant Orders   Flu Vaccine QUAD 36+ mos IM (Completed)    A total of 25 minutes of face to face time was spent with patient more than half of which was spent in counselling about the above mentioned conditions  and coordination of care   I have discontinued Mr. Trefz amoxicillin-clavulanate, predniSONE, and sertraline. I have also changed his sertraline and busPIRone. Additionally, I am having him maintain his montelukast, ergocalciferol, metoprolol succinate, lisinopril, levalbuterol, and topiramate.  Meds ordered this encounter  Medications  . sertraline (ZOLOFT) 100 MG tablet  Sig: Take 2 tablets (200 mg total) by mouth daily.    Dispense:  180 tablet    Refill:  0  . busPIRone (BUSPAR) 15 MG tablet    Sig: Take 1 tablet (15 mg total) by mouth 3 (three) times daily.    Dispense:  270 tablet    Refill:  2    Medications Discontinued During This Encounter  Medication Reason  . predniSONE (DELTASONE) 10 MG tablet Completed Course  . amoxicillin-clavulanate (AUGMENTIN) 875-125 MG tablet Completed Course  . sertraline (ZOLOFT) 100 MG tablet Reorder  . sertraline (ZOLOFT) 50 MG tablet   . busPIRone (BUSPAR) 7.5 MG tablet Reorder    Follow-up: Return in about 3 months (around 08/20/2017) for FOLLOW UP,  FASTING LABS PRIOR.   Sherlene Shams, MD

## 2017-05-21 NOTE — Patient Instructions (Addendum)
You have gained nearly 20 lbs since your last visit   Your number one goal is to GET OUT OF THE HOuSE AND GET TO THE GYM ON A DAILY BASIS  I WILL SEE YOU IN 3 MONTHS

## 2017-05-22 NOTE — Assessment & Plan Note (Signed)
Secondary to morbid obesity.  Return  In  3 months,  Lipids, hepatic panel and a1c ordered

## 2017-05-22 NOTE — Assessment & Plan Note (Addendum)
He has gained 20 lbs because he is not exercising .  He has committed today to walking daily for a minimum of 15 minutes.  Referral to psychology for talk therapy

## 2017-05-22 NOTE — Assessment & Plan Note (Signed)
Reassured patient that he is generally healthy and has no barriers to beginning an exercise program.

## 2017-05-22 NOTE — Assessment & Plan Note (Addendum)
Now managed with zoloft 200 mg daily and buspirone 15 mg tid. Will refer to local psychology

## 2017-05-27 ENCOUNTER — Other Ambulatory Visit: Payer: Self-pay | Admitting: Internal Medicine

## 2017-06-26 ENCOUNTER — Other Ambulatory Visit: Payer: Self-pay

## 2017-06-26 MED ORDER — TOPIRAMATE 100 MG PO TABS: 150.0000 mg | ORAL_TABLET | Freq: Every day | ORAL | 3 refills | Status: DC

## 2017-07-07 ENCOUNTER — Ambulatory Visit (INDEPENDENT_AMBULATORY_CARE_PROVIDER_SITE_OTHER): Payer: 59 | Admitting: Neurology

## 2017-07-07 ENCOUNTER — Encounter: Payer: Self-pay | Admitting: Neurology

## 2017-07-07 VITALS — BP 128/90 | HR 90 | Ht 75.0 in | Wt >= 6400 oz

## 2017-07-07 DIAGNOSIS — G43009 Migraine without aura, not intractable, without status migrainosus: Secondary | ICD-10-CM | POA: Diagnosis not present

## 2017-07-07 MED ORDER — TOPIRAMATE ER 50 MG PO SPRINKLE CAP24: 150.0000 mg | EXTENDED_RELEASE_CAPSULE | Freq: Every day | ORAL | 0 refills | Status: DC

## 2017-07-07 MED ORDER — TOPIRAMATE ER 150 MG PO SPRINKLE CAP24: 150.0000 mg | EXTENDED_RELEASE_CAPSULE | Freq: Every day | ORAL | 5 refills | Status: DC

## 2017-07-07 NOTE — Progress Notes (Signed)
NEUROLOGY FOLLOW UP OFFICE NOTE  Daun Peacocklston C Kasparian [id number]  HISTORY OF PRESENT ILLNESS: Larry Rogers is a 21 year old right-handed male with hypertension, hepatic steatosis, morbid obesity and anxiety who follows up for migraine.   UPDATE: Intensity:  3/10 Duration:  20 seconds on and off for an hour Frequency:  once a week (sometimes twice a week) Current abortive medication:  sometimes Tylenol, but usually none Antihypertensive medications:  none Antidepressant medications:  Sertraline 50mg  Anticonvulsant medications:  topramate 150mg  Vitamins/Herbal/Supplements:  none Other therapy:  none  He still reports paresthesias.  He notices it on his forehead, bridge of nose and inner wrists when he goes to bed at night.  It is resolved when he wakes up.   12/16/16 CMP: Na 139, K 3.6, Cl 108, CO2 24, glucose 120, BUN 113, Cr 0.92, t. bili 0.5, ALP 53, AST 32, ALT 57.  HISTORY: Onset:  5 months ago. Location:  Right temporal/parietal region Quality:  Sharp/stabbing Initial Intensity:  7/10; August: 4.5/10 Aura:  no Prodrome:  no Associated symptoms:  Right eye tears and twitches, dilated right pupil.  Mild photophobia.  He denies double vision.  He has longstanding history of seeing floaters. Initial Duration:  Off and on throughout the day; August: 2 hours Initial Frequency:  Daily.  Goes to sleep and wakes up with it.; August: 2 days per month Relieving factors:  Laying down and napping Activity:  Able to function.     Past abortive medication:  Tylenol, Excedrin, Advil, Ibuprofen (ineffective)   He had a CT of the head without contrast performed on 02/12/15 for the headaches, as well as reported left-sided numbness.  It was negative.   MRI of brain with and without contrast from 03/23/15 was normal.    PAST MEDICAL HISTORY: Past Medical History:  Diagnosis Date  . Acne    [facility]  . Anxiety   . Anxiety   . Asthma    seasonal allergies, Dx age 89  . Depression   .  Fatty liver   . GERD (gastroesophageal reflux disease)   . Headache   . Hypertension 2012   Dr. Kathrene AluArchinald    MEDICATIONS: Current Outpatient Prescriptions on File Prior to Visit  Medication Sig Dispense Refill  . busPIRone (BUSPAR) 15 MG tablet Take 1 tablet (15 mg total) by mouth 3 (three) times daily. 270 tablet 2  . ergocalciferol (VITAMIN D2) 50000 units capsule Take 50,000 Units by mouth once a week.    . levalbuterol (XOPENEX HFA) 45 MCG/ACT inhaler Inhale 2 puffs into the lungs every 6 (six) hours as needed for wheezing. 1 Inhaler 12  . lisinopril (PRINIVIL,ZESTRIL) 10 MG tablet TAKE 1 TABLET (10 MG TOTAL) BY MOUTH DAILY. 90 tablet 1  . metoprolol succinate (TOPROL-XL) 25 MG 24 hr tablet Take 1 tablet (25 mg total) by mouth daily. 90 tablet 3  . montelukast (SINGULAIR) 10 MG tablet Take 1 tablet (10 mg total) by mouth at bedtime. 90 tablet 4  . sertraline (ZOLOFT) 100 MG tablet Take 2 tablets (200 mg total) by mouth daily. 180 tablet 0   No current facility-administered medications on file prior to visit.     ALLERGIES: No Known Allergies  FAMILY HISTORY: Family History  Problem Relation Age of Onset  . Hypertension Father   . Heart disease Paternal Grandfather   . Hypertension Paternal Grandfather   . Aortic aneurysm Paternal Grandfather   . Diabetes Mother   . Diabetes Maternal Uncle  x2  . Colon cancer Neg Hx   . Colon polyps Neg Hx   . Esophageal cancer Neg Hx   . Kidney disease Neg Hx   . Gallbladder disease Neg Hx     SOCIAL HISTORY: Social History   Social History  . Marital status: Single    Spouse name: N/A  . Number of children: 0  . Years of education: N/A   Occupational History  . Student     [employer]   Social History Main Topics  . Smoking status: Never Smoker  . Smokeless tobacco: Never Used  . Alcohol use No  . Drug use: No  . Sexual activity: No   Other Topics Concern  . Not on file   Social History Narrative   Lives in  [place] family. Pets - dog and cat.      School - [employer], rising senior. Plans to major in accountant.      Diet - regular, tries to limit processed sugars      Exercise - started basketball, swimming    REVIEW OF SYSTEMS: Constitutional: No fevers, chills, or sweats, no generalized fatigue, change in appetite Eyes: No visual changes, double vision, eye pain Ear, nose and throat: No hearing loss, ear pain, nasal congestion, sore throat Cardiovascular: No chest pain, palpitations Respiratory:  No shortness of breath at rest or with exertion, wheezes GastrointestinaI: No nausea, vomiting, diarrhea, abdominal pain, fecal incontinence Genitourinary:  No dysuria, urinary retention or frequency Musculoskeletal:  No neck pain, back pain Integumentary: No rash, pruritus, skin lesions Neurological: as above Psychiatric: No depression, insomnia, anxiety Endocrine: No palpitations, fatigue, diaphoresis, mood swings, change in appetite, change in weight, increased thirst Hematologic/Lymphatic:  No purpura, petechiae. Allergic/Immunologic: no itchy/runny eyes, nasal congestion, recent allergic reactions, rashes  PHYSICAL EXAM: Vitals:   07/07/17 0745  BP: 128/90  Pulse: 90  SpO2: 97%   General: No acute distress.  Patient appears well-groomed.  Morbidly obese body habitus. Head:  Normocephalic/atraumatic Eyes:  Fundi examined but not visualized Neck: supple, no paraspinal tenderness, full range of motion Heart:  Regular rate and rhythm Lungs:  Clear to auscultation bilaterally Back: No paraspinal tenderness Neurological Exam: alert and oriented to person, place, and time. Attention span and concentration intact, recent and remote memory intact, fund of knowledge intact.  Speech fluent and not dysarthric, language intact.  CN II-XII intact. Bulk and tone normal, muscle strength 5/5 throughout.  Sensation to light touch  intact.  Deep tendon reflexes 2+ throughout.  Finger to nose  testing intact.  Gait normal  IMPRESSION: Migraine, improved Morbid obesity (BMI 61.97)  PLAN: 1.  We will switch topiramate to extended release topiramate 150mg  daily at bedtime, as topiramate is effective and the extended release form less likely causes paresthesias. 2.  Tylenol as needed 3.  Exercise and weight loss 4.  Follow up in 6 months.  Shon Millet, DO  CC:  Duncan Dull, MD

## 2017-07-07 NOTE — Patient Instructions (Signed)
1.  We will switch from topiramate to extended release topiramate 150mg  daily (may take at bedtime).  Extended release topiramate less likely causes the numbness and tingling.  2.  Follow up in 6 months.

## 2017-07-31 ENCOUNTER — Encounter: Payer: Self-pay | Admitting: Internal Medicine

## 2017-07-31 ENCOUNTER — Ambulatory Visit (INDEPENDENT_AMBULATORY_CARE_PROVIDER_SITE_OTHER): Payer: 59 | Admitting: Internal Medicine

## 2017-07-31 VITALS — BP 128/90 | HR 108 | Temp 98.2°F | Wt >= 6400 oz

## 2017-07-31 DIAGNOSIS — J4521 Mild intermittent asthma with (acute) exacerbation: Secondary | ICD-10-CM

## 2017-07-31 MED ORDER — ALBUTEROL SULFATE HFA 108 (90 BASE) MCG/ACT IN AERS
2.0000 | INHALATION_SPRAY | Freq: Four times a day (QID) | RESPIRATORY_TRACT | 0 refills | Status: DC | PRN
Start: 2017-07-31 — End: 2019-08-06

## 2017-07-31 MED ORDER — PREDNISONE 10 MG PO TABS: ORAL_TABLET | ORAL | 0 refills | Status: DC

## 2017-07-31 NOTE — Progress Notes (Signed)
HPI  Pt presents to the clinic today with c/o cough. This started 3 days ago. The cough is non productive. He denies runny nose, nasal congestion, sore throat. He denies fever, chills or body aches. He has tired his Albuterol inhaler with some relief. He has not had sick contacts but did sit outside in the cold for a football game Monday night. He does have asthma. He has not had sick contacts that he is aware of.  Review of Systems        Past Medical History:  Diagnosis Date  . Acne    [facility]  . Anxiety   . Anxiety   . Asthma    seasonal allergies, Dx age 89  . Depression   . Fatty liver   . GERD (gastroesophageal reflux disease)   . Headache   . Hypertension 2012   Dr. Kathrene AluArchinald    Family History  Problem Relation Age of Onset  . Hypertension Father   . Heart disease Paternal Grandfather   . Hypertension Paternal Grandfather   . Aortic aneurysm Paternal Grandfather   . Diabetes Mother   . Diabetes Maternal Uncle        x2  . Colon cancer Neg Hx   . Colon polyps Neg Hx   . Esophageal cancer Neg Hx   . Kidney disease Neg Hx   . Gallbladder disease Neg Hx     Social History   Socioeconomic History  . Marital status: Single    Spouse name: Not on file  . Number of children: 0  . Years of education: Not on file  . Highest education level: Not on file  Social Needs  . Financial resource strain: Not on file  . Food insecurity - worry: Not on file  . Food insecurity - inability: Not on file  . Transportation needs - medical: Not on file  . Transportation needs - non-medical: Not on file  Occupational History  . Occupation: Consulting civil engineertudent    Comment: [employer]  Tobacco Use  . Smoking status: Never Smoker  . Smokeless tobacco: Never Used  Substance and Sexual Activity  . Alcohol use: No    Alcohol/week: 0.0 oz  . Drug use: No  . Sexual activity: No  Other Topics Concern  . Not on file  Social History Narrative   Lives in [place] family. Pets - dog and cat.     School - [employer], rising senior. Plans to major in accountant.      Diet - regular, tries to limit processed sugars      Exercise - started basketball, swimming    No Known Allergies   Constitutional:Denies headache, fatigue, fever or abrupt weight changes.  HEENT:  . Denies eye redness, eye pain, pressure behind the eyes, facial pain, nasal congestion, ear pain, ringing in the ears, wax buildup, runny nose or sore throat. Respiratory: Positive cough. Denies difficulty breathing or shortness of breath.  Cardiovascular: Denies chest pain, chest tightness, palpitations or swelling in the hands or feet.   No other specific complaints in a complete review of systems (except as listed in HPI above).  Objective:  BP 128/90   Pulse (!) 108   Temp 98.2 F (36.8 C) (Oral)   Wt (!) 501 lb (227.3 kg)   SpO2 98%   BMI 62.62 kg/m   Wt Readings from Last 3 Encounters:  07/31/17 (!) 501 lb (227.3 kg)  07/07/17 (!) 495 lb 12.8 oz (224.9 kg)  05/21/17 (!) 485 lb 1.9  oz (220 kg)     General: Appears his stated age, morbidly obese  in NAD. HEENT: Head: normal shape and size; Ears: Tm's gray and intact, normal light reflex; Nose: mucosa pink and moist, septum midline; Throat/Mouth: Teeth present, mucosa pink and moist, no exudate noted, no lesions or ulcerations noted.  Neck: No cervical lymphadenopathy.   Pulmonary/Chest: Normal effort with bilateral inspiratory and expiratory wheezing noted. No respiratory distress. No rales or ronchi noted.       Assessment & Plan:   Asthma Exacerbation:  Get some rest and drink plenty of water eRx for Pred Taper x 6 days eRx for Albuterol inhaler, use as directed RTC as needed or if symptoms persist.   Nicki ReaperBAITY, , NP

## 2017-07-31 NOTE — Patient Instructions (Signed)
Bronchospasm, Adult Bronchospasm is a tightening of the airways going into the lungs. During an episode, it may be harder to breathe. You may cough, and you may make a whistling sound when you breathe (wheeze). This condition often affects people with asthma. What are the causes? This condition is caused by swelling and irritation in the airways. It can be triggered by:  An infection (common).  Seasonal allergies.  An allergic reaction.  Exercise.  Irritants. These include pollution, cigarette smoke, strong odors, aerosol sprays, and paint fumes.  Weather changes. Winds increase molds and pollens in the air. Cold air may cause swelling.  Stress and emotional upset.  What are the signs or symptoms? Symptoms of this condition include:  Wheezing. If the episode was triggered by an allergy, wheezing may start right away or hours later.  Nighttime coughing.  Frequent or severe coughing with a simple cold.  Chest tightness.  Shortness of breath.  Decreased ability to exercise.  How is this diagnosed? This condition is usually diagnosed with a review of your medical history and a physical exam. Tests, such as lung function tests, are sometimes done to look for other conditions. The need for a chest X-ray depends on where the wheezing occurs and whether it is the first time you have wheezed. How is this treated? This condition may be treated with:  Inhaled medicines. These open up the airways and help you breathe. They can be taken with an inhaler or a nebulizer device.  Corticosteroid medicines. These may be given for severe bronchospasm, usually when it is associated with asthma.  Avoiding triggers, such as irritants, infection, or allergies.  Follow these instructions at home: Medicines  Take over-the-counter and prescription medicines only as told by your health care provider.  If you need to use an inhaler or nebulizer to take your medicine, ask your health care  provider to explain how to use it correctly. If you were given a spacer, always use it with your inhaler. Lifestyle  Reduce the number of triggers in your home. To do this: ? Change your heating and air conditioning filter at least once a month. ? Limit your use of fireplaces and wood stoves. ? Do not smoke. Do not allow smoking in your home. ? Avoid using perfumes and fragrances. ? Get rid of pests, such as roaches and mice, and their droppings. ? Remove any mold from your home. ? Keep your house clean and dust free. Use unscented cleaning products. ? Replace carpet with wood, tile, or vinyl flooring. Carpet can trap dander and dust. ? Use allergy-proof pillows, mattress covers, and box spring covers. ? Wash bed sheets and blankets every week in hot water. Dry them in a dryer. ? Use blankets that are made of polyester or cotton. ? Wash your hands often. ? Do not allow pets in your bedroom.  Avoid breathing in cold air when you exercise. General instructions  Have a plan for seeking medical care. Know when to call your health care provider and local emergency services, and where to get emergency care.  Stay up to date on your immunizations.  When you have an episode of bronchospasm, stay calm. Try to relax and breathe more slowly.  If you have asthma, make sure you have an asthma action plan.  Keep all follow-up visits as told by your health care provider. This is important. Contact a health care provider if:  You have muscle aches.  You have chest pain.  The mucus that you   cough up (sputum) changes from clear or white to yellow, green, gray, or bloody.  You have a fever.  Your sputum gets thicker. Get help right away if:  Your wheezing and coughing get worse, even after you take your prescribed medicines.  It gets even harder to breathe.  You develop severe chest pain. Summary  Bronchospasm is a tightening of the airways going into the lungs.  During an episode of  bronchospasm, you may have a harder time breathing. You may cough and make a whistling sound when you breathe (wheeze).  Avoid exposure to triggers such as smoke, dust, mold, animal dander, and fragrances.  When you have an episode of bronchospasm, stay calm. Try to relax and breathe more slowly. This information is not intended to replace advice given to you by your health care provider. Make sure you discuss any questions you have with your health care provider. Document Released: 09/05/2003 Document Revised: 08/29/2016 Document Reviewed: 08/29/2016 Elsevier Interactive Patient Education  2017 Elsevier Inc.  

## 2017-08-17 ENCOUNTER — Other Ambulatory Visit: Payer: Self-pay | Admitting: Internal Medicine

## 2017-08-20 ENCOUNTER — Other Ambulatory Visit (INDEPENDENT_AMBULATORY_CARE_PROVIDER_SITE_OTHER): Payer: 59

## 2017-08-20 DIAGNOSIS — K760 Fatty (change of) liver, not elsewhere classified: Secondary | ICD-10-CM | POA: Diagnosis not present

## 2017-08-20 LAB — LIPID PANEL
CHOLESTEROL: 123 mg/dL (ref 0–200)
HDL: 33.2 mg/dL — ABNORMAL LOW (ref >39.00)
LDL Cholesterol: 58 mg/dL (ref 0–99)
NonHDL: 89.92
Total CHOL/HDL Ratio: 4
Triglycerides: 161 mg/dL — ABNORMAL HIGH (ref 0.0–149.0)
VLDL: 32.2 mg/dL (ref 0.0–40.0)

## 2017-08-20 LAB — COMPREHENSIVE METABOLIC PANEL
ALBUMIN: 4 g/dL (ref 3.5–5.2)
ALT: 53 U/L (ref 0–53)
AST: 30 U/L (ref 0–37)
Alkaline Phosphatase: 48 U/L (ref 39–117)
BUN: 13 mg/dL (ref 6–23)
CHLORIDE: 103 meq/L (ref 96–112)
CO2: 29 meq/L (ref 19–32)
Calcium: 9.2 mg/dL (ref 8.4–10.5)
Creatinine, Ser: 0.86 mg/dL (ref 0.40–1.50)
GFR: 118.91 mL/min (ref >60.00)
GLUCOSE: 113 mg/dL — AB (ref 70–99)
POTASSIUM: 3.9 meq/L (ref 3.5–5.1)
SODIUM: 138 meq/L (ref 135–145)
TOTAL PROTEIN: 6.5 g/dL (ref 6.0–8.3)
Total Bilirubin: 0.5 mg/dL (ref 0.2–1.2)

## 2017-08-20 LAB — HEMOGLOBIN A1C: HEMOGLOBIN A1C: 5.6 % (ref 4.6–6.5)

## 2017-08-22 ENCOUNTER — Ambulatory Visit: Payer: 59 | Admitting: Internal Medicine

## 2017-08-22 ENCOUNTER — Encounter: Payer: Self-pay | Admitting: Internal Medicine

## 2017-09-11 ENCOUNTER — Telehealth: Payer: Self-pay | Admitting: Internal Medicine

## 2017-09-11 NOTE — Telephone Encounter (Signed)
Buspirone on back order of 15mg . Can script be switched to 7.5mg  for the pt to take two at a time, due to pharmacy availability?

## 2017-09-11 NOTE — Telephone Encounter (Signed)
Copied from CRM [phone]#[id number]. Topic: Quick Communication - See Telephone Encounter >> Sep 11, 2017  4:45 PM Terisa Starraylor, Brittany L wrote: CRM for notification. See Telephone encounter for:   09/11/17  Buspirone is on back order for the 15mg , pharmacy wants to know if the script could be switched to 7.5 mg & take two at a time. Please call back  [facility] [address]

## 2017-09-12 MED ORDER — BUSPIRONE HCL 7.5 MG PO TABS: 15.0000 mg | ORAL_TABLET | Freq: Three times a day (TID) | ORAL | 2 refills | Status: DC

## 2017-09-12 NOTE — Telephone Encounter (Signed)
LMTCB. Need to let pt know that medication was changed and sent to pharmacy.

## 2017-09-12 NOTE — Telephone Encounter (Signed)
Yes, rx changed and sent

## 2017-09-12 NOTE — Telephone Encounter (Signed)
Please advise 

## 2017-09-18 NOTE — Telephone Encounter (Signed)
Spoke with pt and he stated that everything has been taken care of.

## 2017-10-01 ENCOUNTER — Other Ambulatory Visit: Payer: Self-pay | Admitting: Internal Medicine

## 2017-11-09 IMAGING — CR DG CHEST 2V
1 series · 3 of 3 positions shown · non-contrast
Comparison: Chest radiograph performed 01/26/2016

CLINICAL DATA: Acute onset of generalized abdominal pain, diarrhea,
vomiting and fever. Initial encounter.

EXAM:
CHEST  2 VIEW

[Series 1: dg chest 2 view · 0.14mm/px · 3 of 3 slices shown]
[im 1/3]
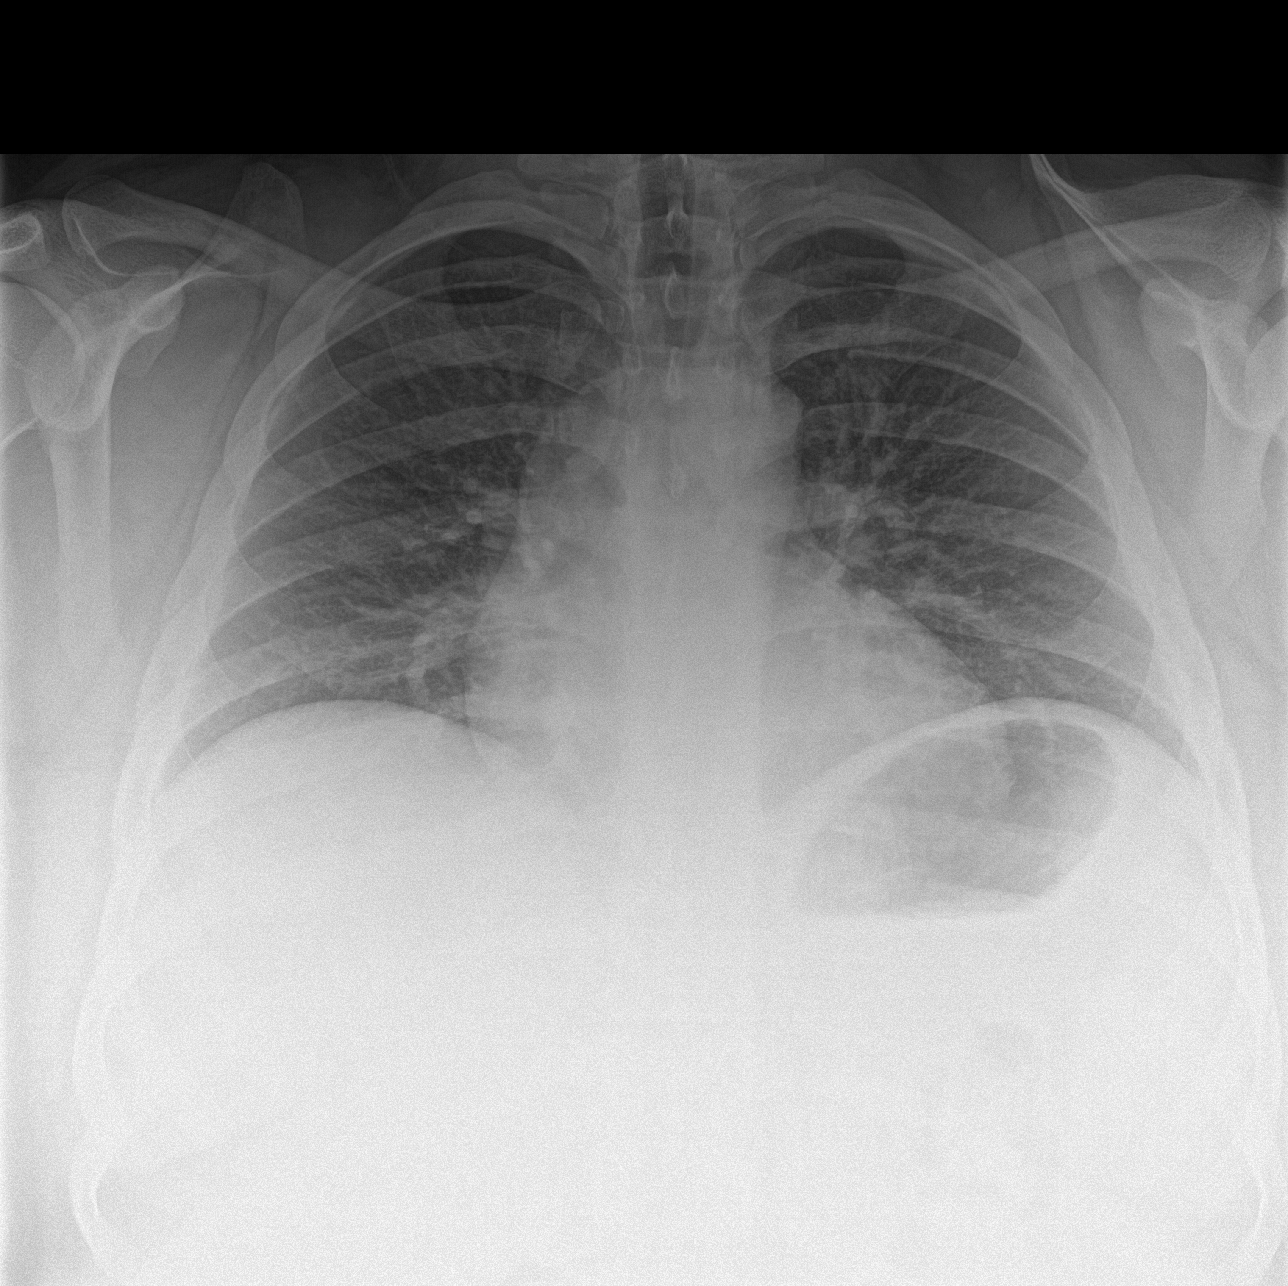
[im 2/3]
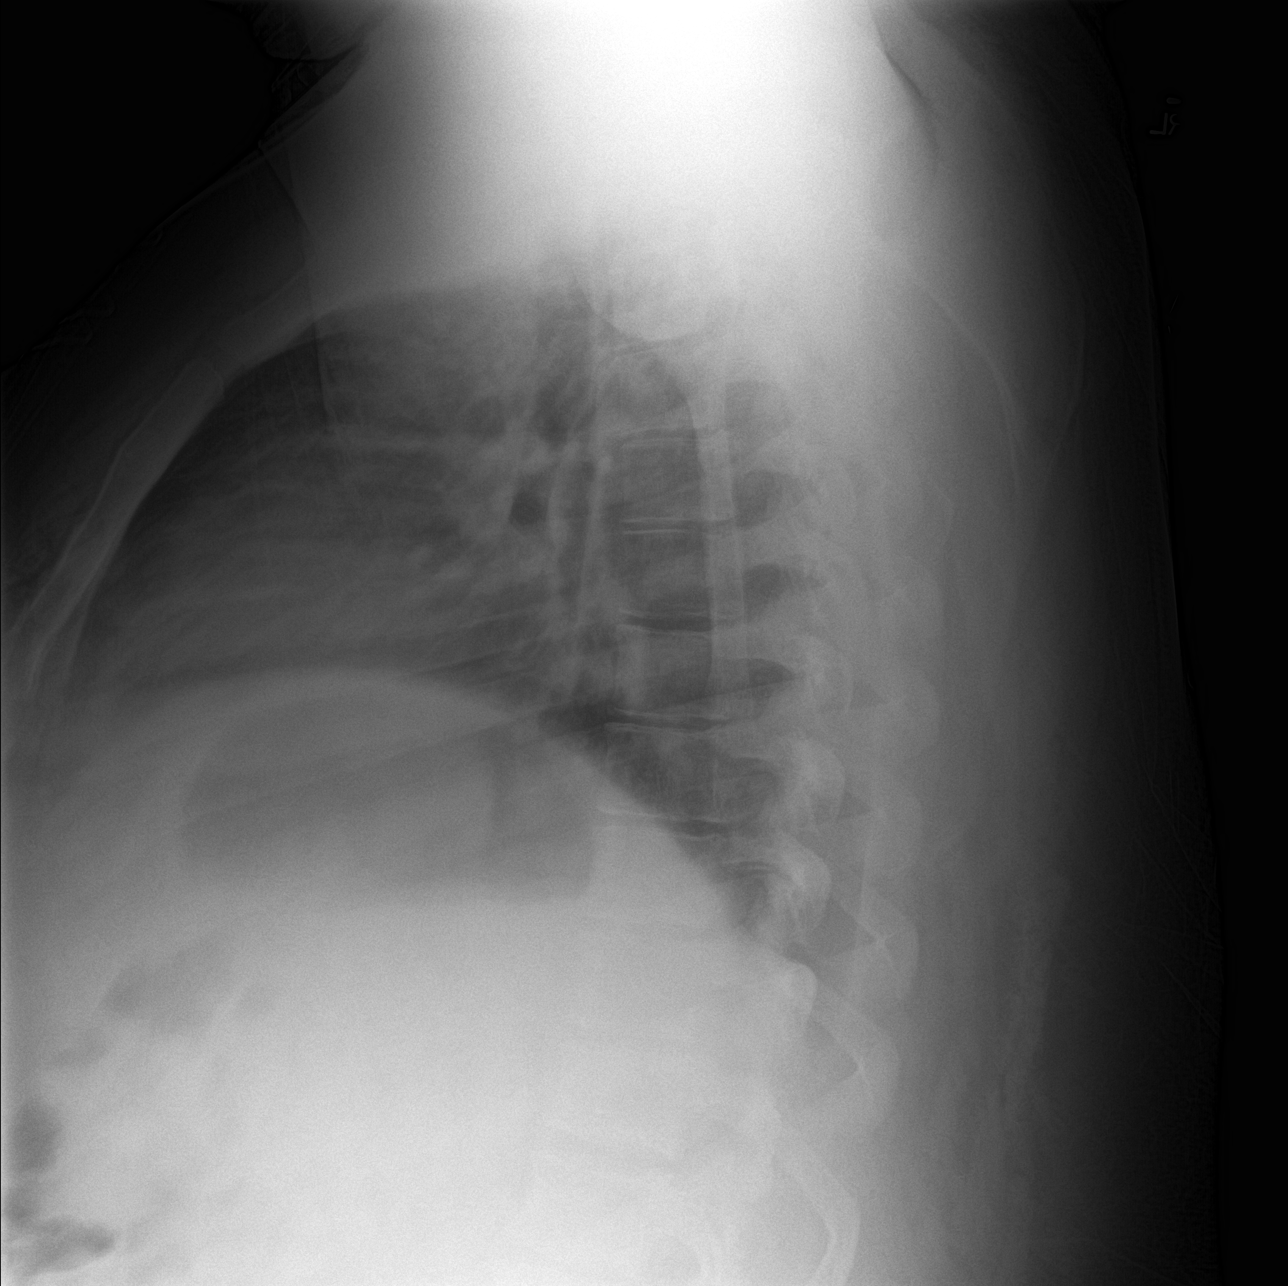
[im 3/3]
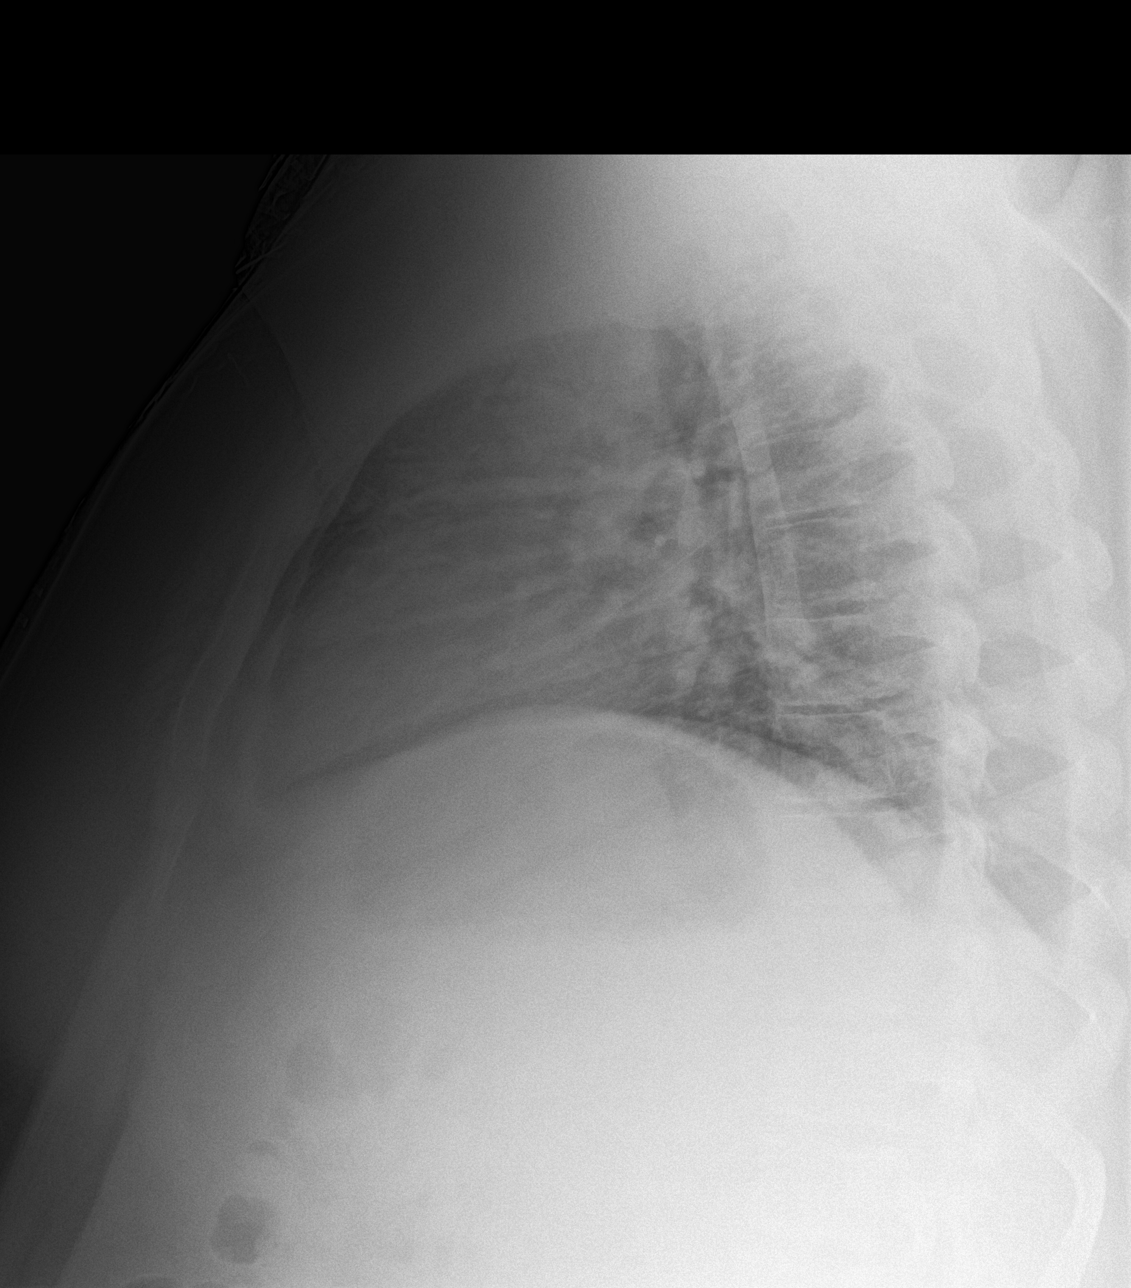

[3 of 3 positions shown; findings below may reference images not displayed]

FINDINGS: The lungs are hypoexpanded. Vascular congestion and vascular
crowding are noted. There is no evidence of focal opacification,
pleural effusion or pneumothorax.

The heart is normal in size; the mediastinal contour is within
normal limits. No acute osseous abnormalities are seen.
IMPRESSION: Lungs hypoexpanded, with vascular congestion. Lungs appear grossly
clear.

## 2017-11-09 IMAGING — CT CT ABD-PELV W/ CM
2 of 4 series · 16 of 46 positions shown, 18 images · IV contrast (APPLIED)
Comparison: None.

CLINICAL DATA: Generalized abdominal pain.

EXAM:
CT ABDOMEN AND PELVIS WITH CONTRAST
TECHNIQUE: Multidetector CT imaging of the abdomen and pelvis was performed
using the standard protocol following bolus administration of
intravenous contrast.
CONTRAST:  125mL KXXMS3-SHH IOPAMIDOL (KXXMS3-SHH) INJECTION 61%

[Series 2: axial st · axial · 0.89mm/px · z∈[-952,-397]mm · 13 of 121 slices shown, 15 images]
[im 5/121  soft-tissue]
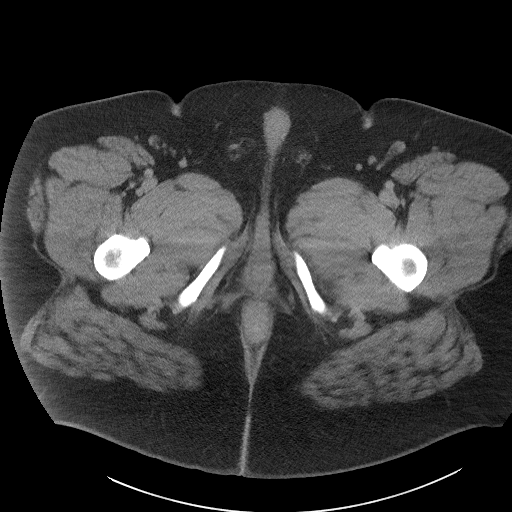
[im 5/121  bone]
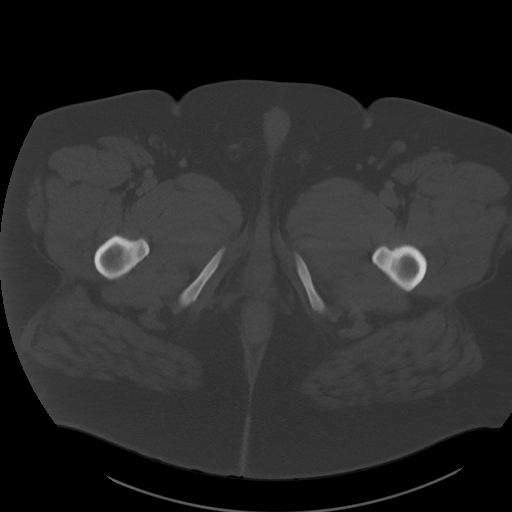
[im 15/121  soft-tissue]
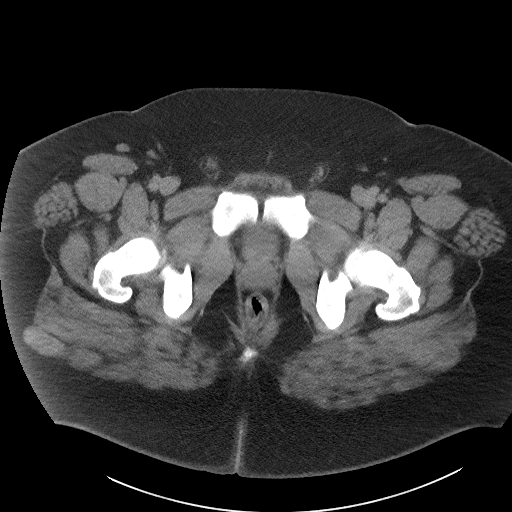
[im 25/121  soft-tissue]
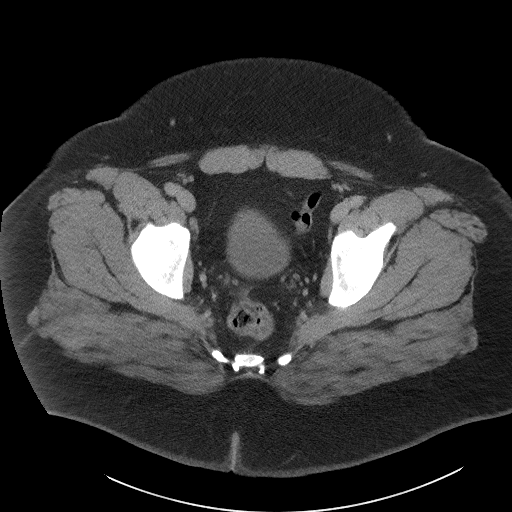
[im 34/121  soft-tissue]
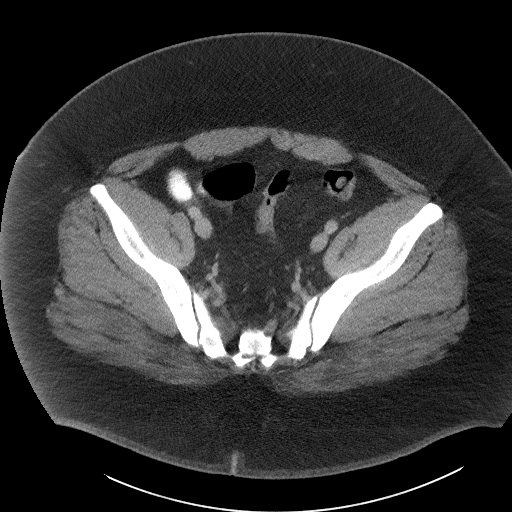
[im 44/121  soft-tissue]
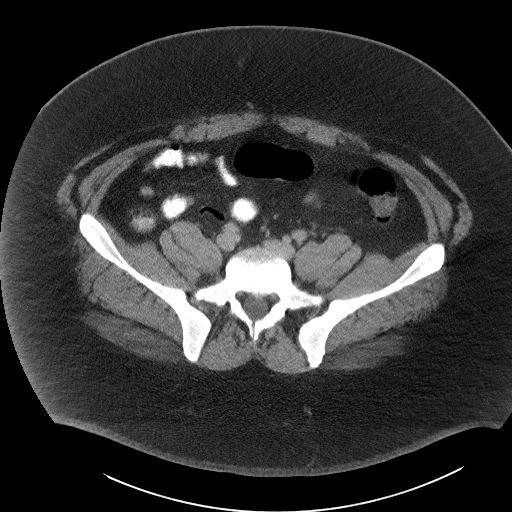
[im 53/121  soft-tissue]
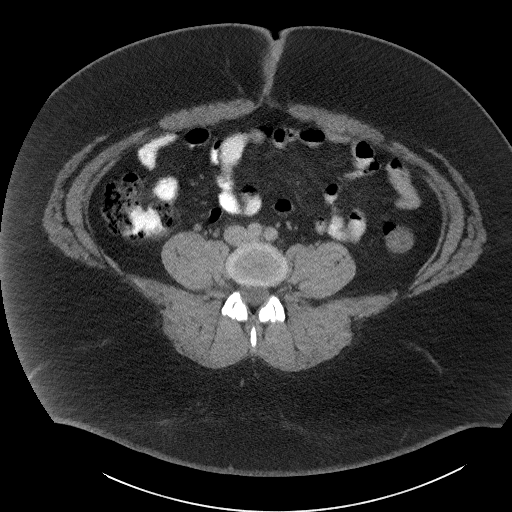
[im 63/121  soft-tissue]
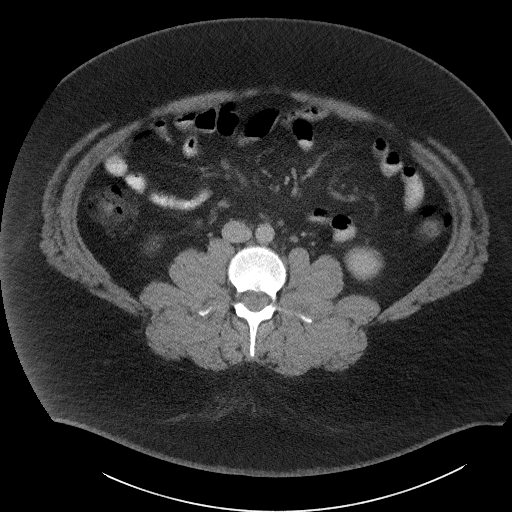
[im 68/121  soft-tissue]
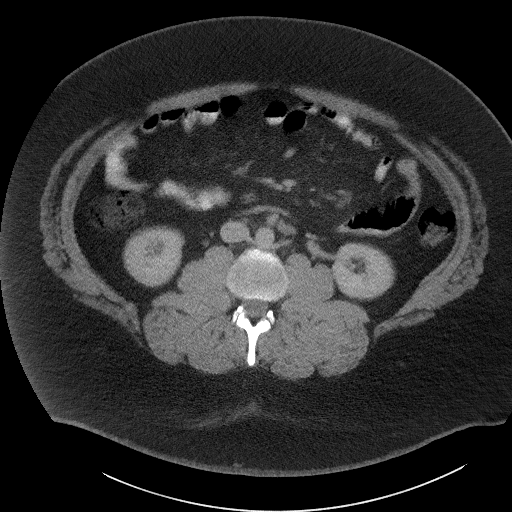
[im 77/121  soft-tissue]
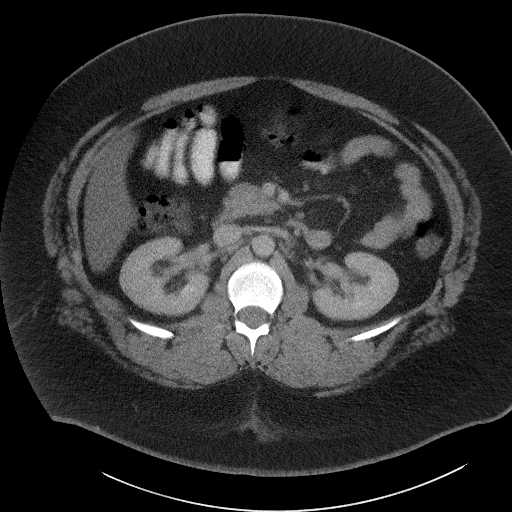
[im 77/121  bone]
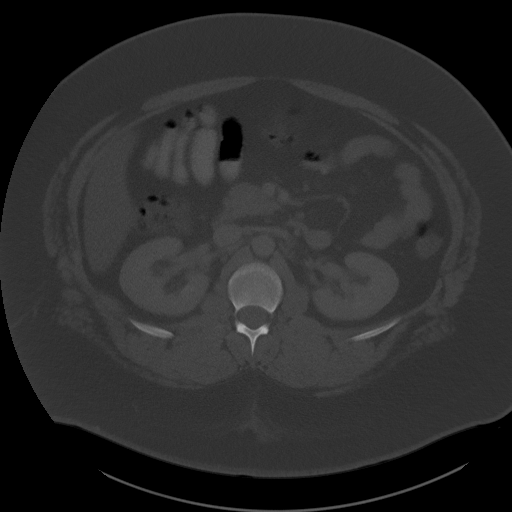
[im 87/121  soft-tissue]
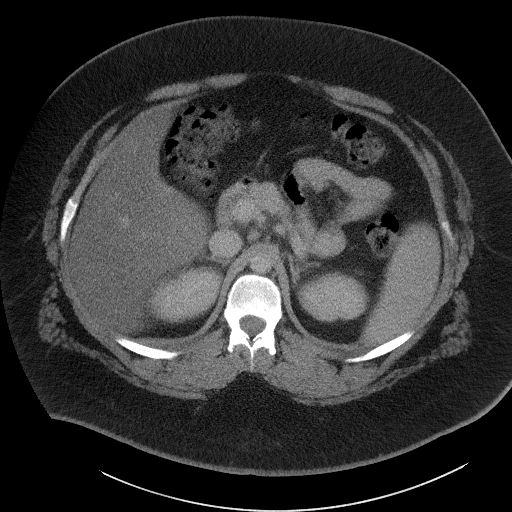
[im 97/121  soft-tissue]
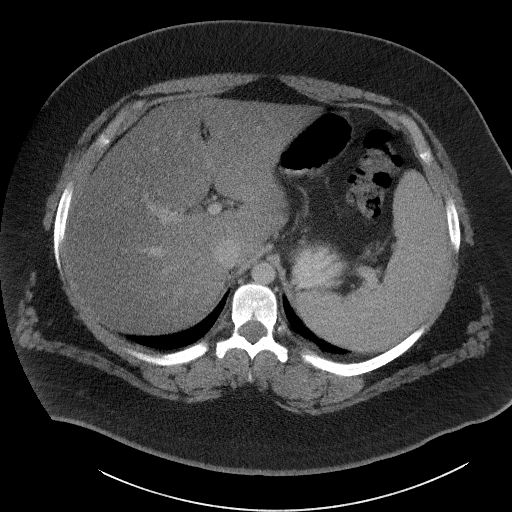
[im 106/121  soft-tissue]
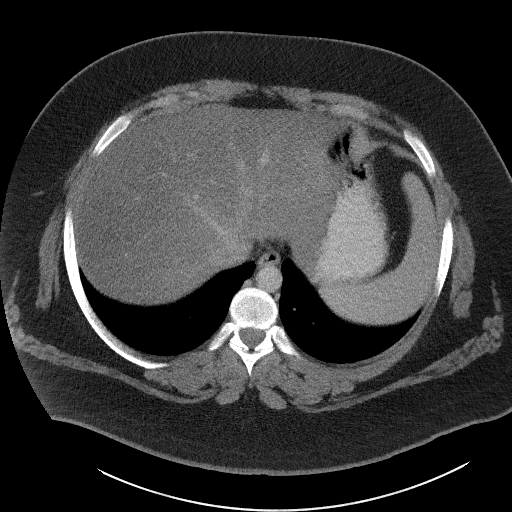
[im 116/121  soft-tissue]
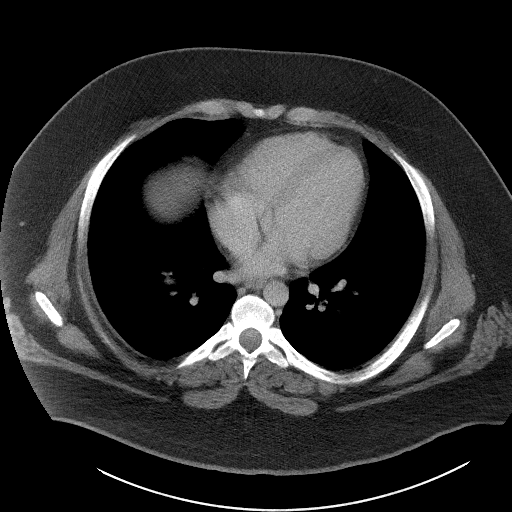

[Series 5: coronal st · coronal · 0.95mm/px · 3 of 101 slices shown]
[im 34/101  soft-tissue]
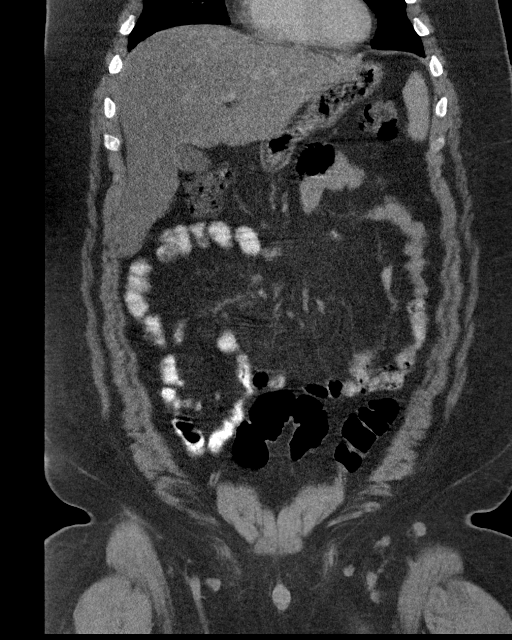
[im 45/101  soft-tissue]
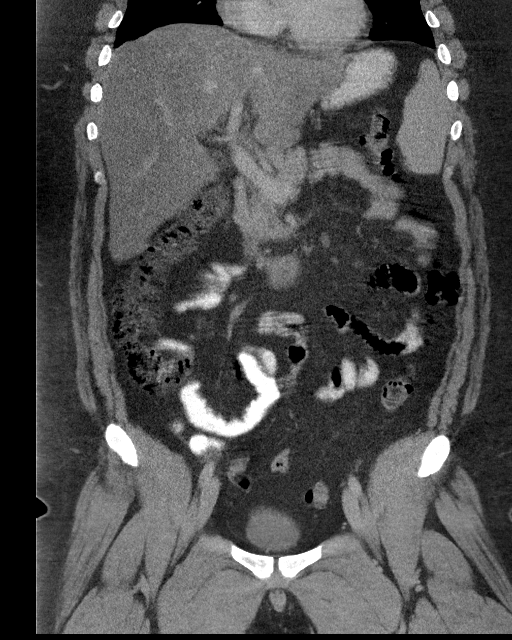
[im 56/101  soft-tissue]
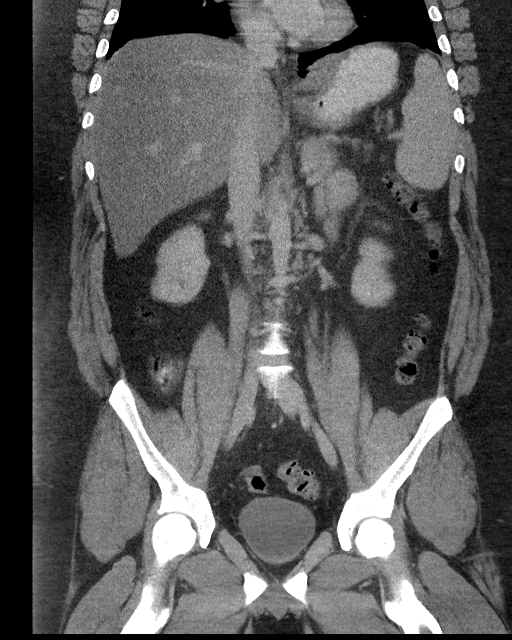

[16 of 46 positions shown; findings below may reference images not displayed]

FINDINGS: Lower chest:  Unremarkable.

Hepatobiliary: The liver shows diffusely decreased attenuation
suggesting steatosis. Are There is no evidence for gallstones,
gallbladder wall thickening, or pericholecystic fluid. No
intrahepatic or extrahepatic biliary dilation.

Pancreas: No focal mass lesion. No dilatation of the main duct. No
intraparenchymal cyst. No peripancreatic edema.

Spleen: No splenomegaly. No focal mass lesion.

Adrenals/Urinary Tract: No adrenal nodule or mass. Kidneys are
unremarkable. No evidence for hydroureter. The urinary bladder
appears normal for the degree of distention.

Stomach/Bowel: Stomach is nondistended. No gastric wall thickening.
No evidence of outlet obstruction. Duodenum is normally positioned
as is the ligament of Treitz. No small bowel wall thickening. No
small bowel dilatation. The terminal ileum is normal. The appendix
is normal. No gross colonic mass. No colonic wall thickening. No
substantial diverticular change.

Vascular/Lymphatic: No abdominal aortic aneurysm. No abdominal
aortic atherosclerotic calcification. 16 mm short axis
peripancreatic lymph node is seen on image 31 series 2. Borderline
hepatoduodenal ligament lymph nodes are evident. 13 mm short axis
left para-aortic lymph node is seen on image 51 series 2. No pelvic
sidewall lymphadenopathy.

Reproductive: The prostate gland and seminal vesicles have normal
imaging features.

Other: No intraperitoneal free fluid.

Musculoskeletal: Bone windows reveal no worrisome lytic or sclerotic
osseous lesions.
IMPRESSION: 1. Hepatic steatosis.
2. Mildly enlarged upper abdominal lymph nodes, indeterminate. As
lymphoma is not excluded, follow-up CT in 3 months recommended to
ensure stability. If more immediate characterization is indicated,
PET-CT may prove helpful to further evaluate.

These results will be called to the ordering clinician or
representative by the Radiologist Assistant, and communication
documented in the PACS or zVision Dashboard.

## 2017-11-22 ENCOUNTER — Other Ambulatory Visit: Payer: Self-pay | Admitting: Internal Medicine

## 2017-11-23 ENCOUNTER — Other Ambulatory Visit: Payer: Self-pay | Admitting: Internal Medicine

## 2017-12-27 ENCOUNTER — Other Ambulatory Visit: Payer: Self-pay | Admitting: Internal Medicine

## 2017-12-27 ENCOUNTER — Other Ambulatory Visit: Payer: Self-pay | Admitting: Neurology

## 2018-01-06 ENCOUNTER — Ambulatory Visit: Payer: 59 | Admitting: Neurology

## 2018-01-12 ENCOUNTER — Encounter: Payer: Self-pay | Admitting: Neurology

## 2018-01-22 IMAGING — CT CT ABD-PELV W/ CM
1 of 2 series · 15 of 32 positions shown, 19 images · IV contrast (isovue)
Comparison: CT abdomen pelvis 07/01/2016.

CLINICAL DATA: Patient with history of lymphadenopathy.

EXAM:
CT ABDOMEN AND PELVIS WITH CONTRAST
TECHNIQUE: Multidetector CT imaging of the abdomen and pelvis was performed
using the standard protocol following bolus administration of
intravenous contrast.
CONTRAST:  125 cc Isovue 370

[Series 2: axial st · axial · 0.98mm/px · z∈[-1070,-545]mm · 15 of 115 slices shown, 19 images]
[im 5/115  soft-tissue]
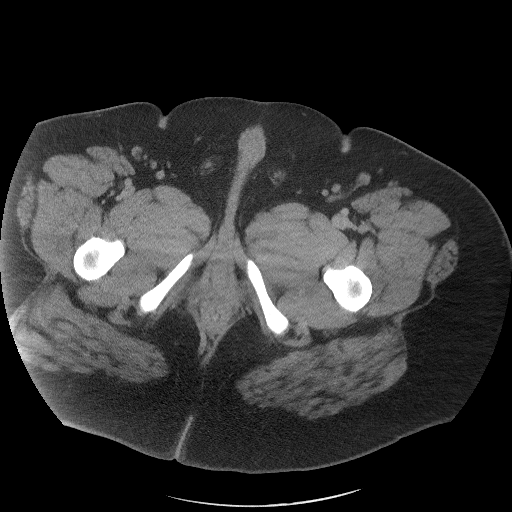
[im 5/115  bone]
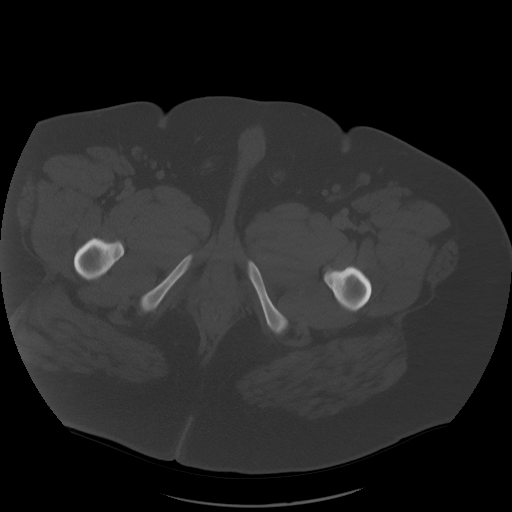
[im 14/115  soft-tissue]
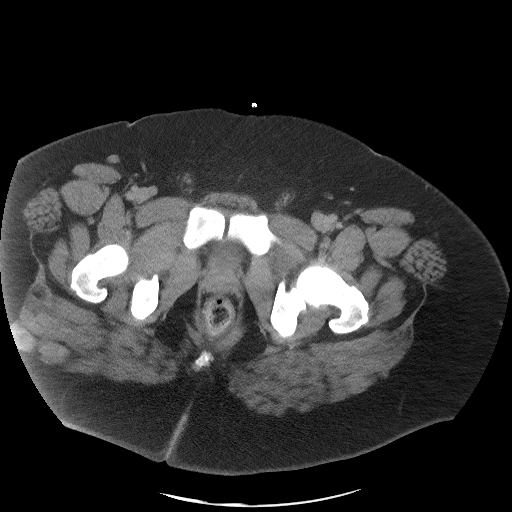
[im 23/115  soft-tissue]
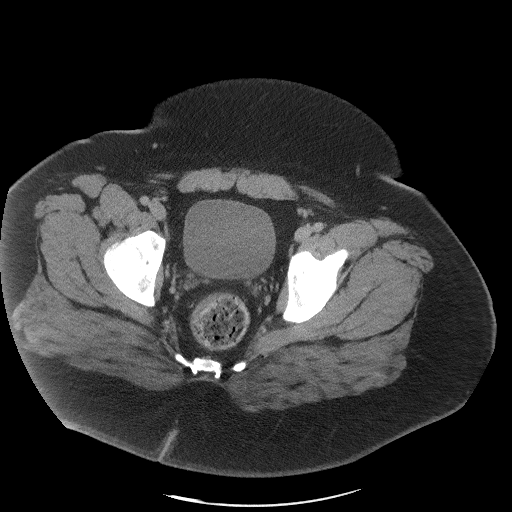
[im 32/115  soft-tissue]
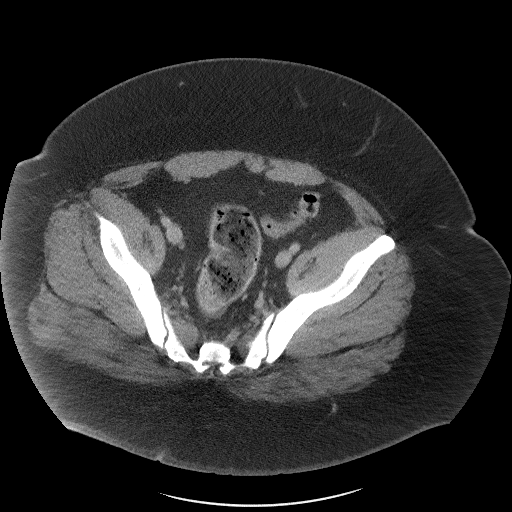
[im 42/115  soft-tissue]
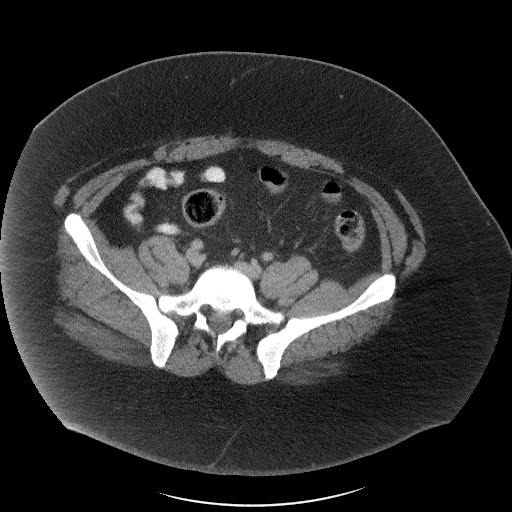
[im 51/115  soft-tissue]
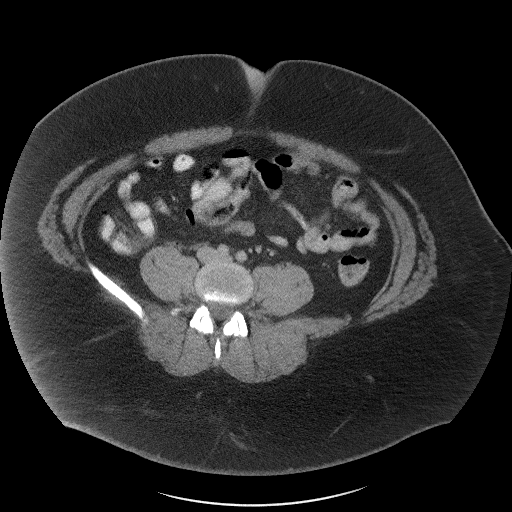
[im 60/115  soft-tissue]
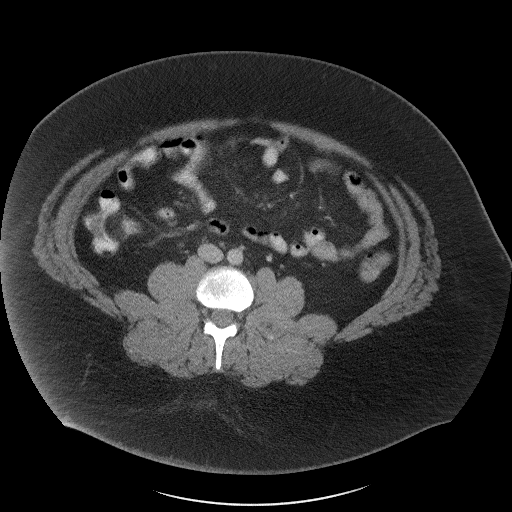
[im 64/115  soft-tissue]
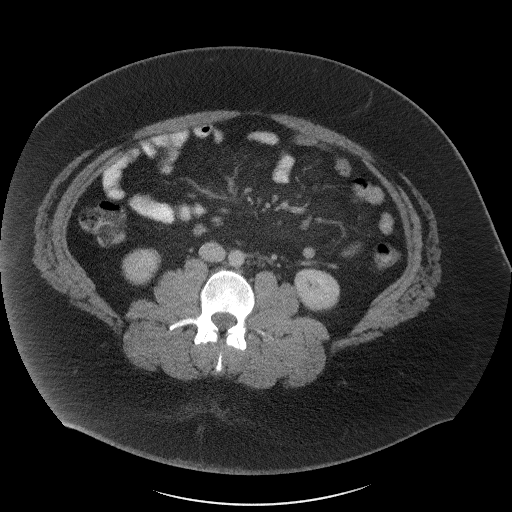
[im 73/115  soft-tissue]
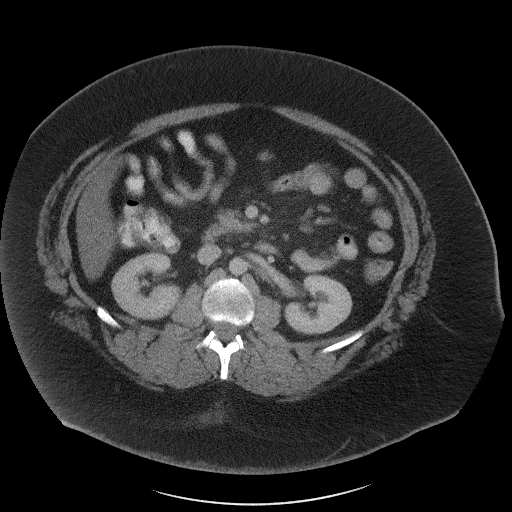
[im 73/115  bone]
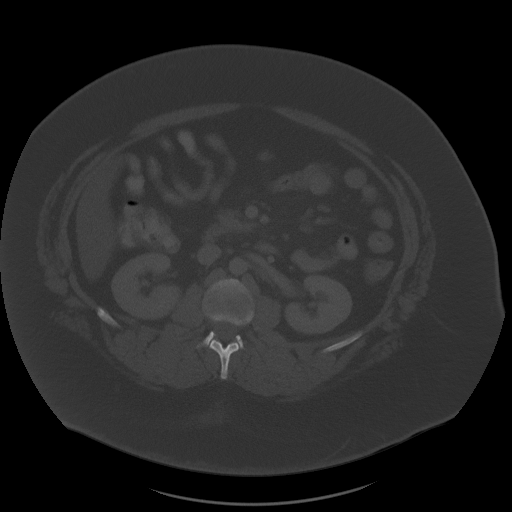
[im 83/115  soft-tissue]
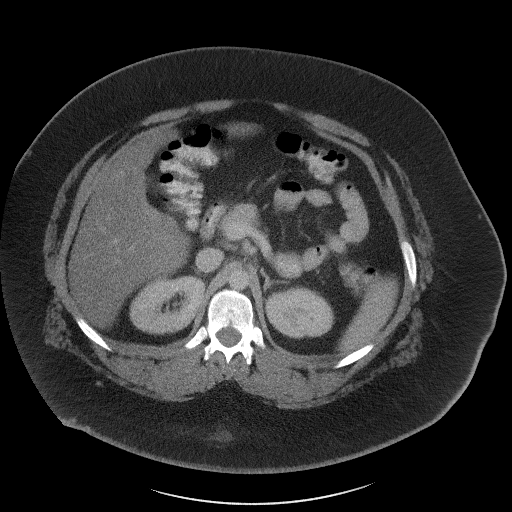
[im 92/115  soft-tissue]
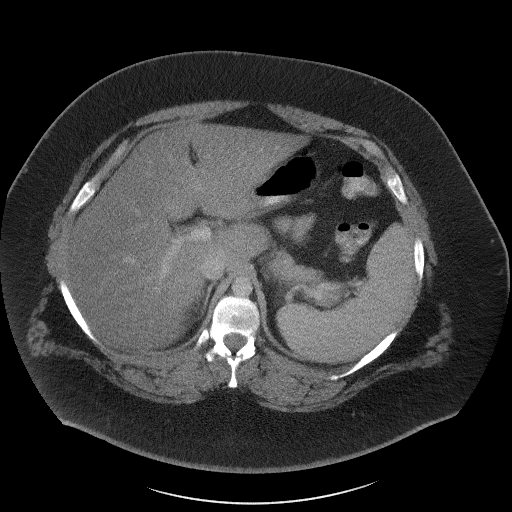
[im 96/115  lung]
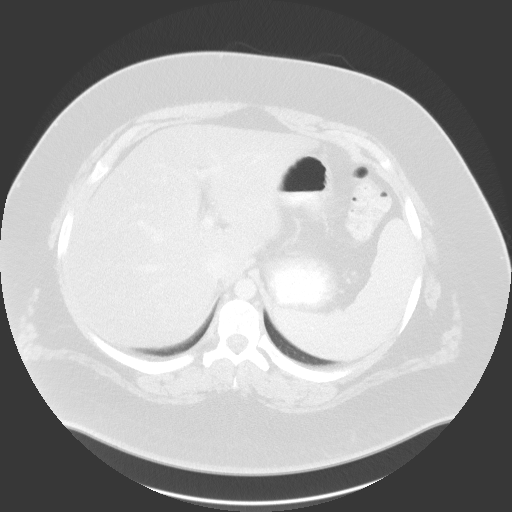
[im 101/115  soft-tissue]
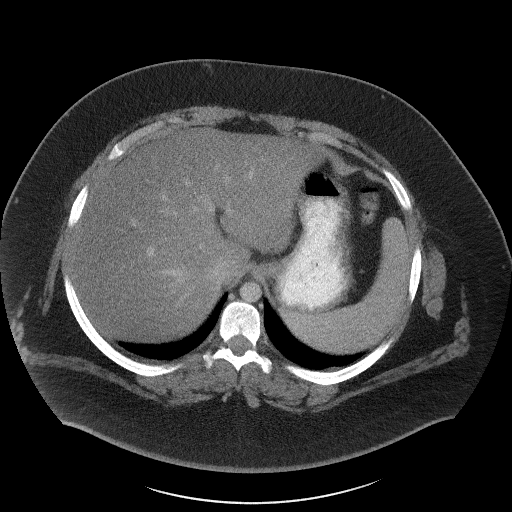
[im 101/115  lung]
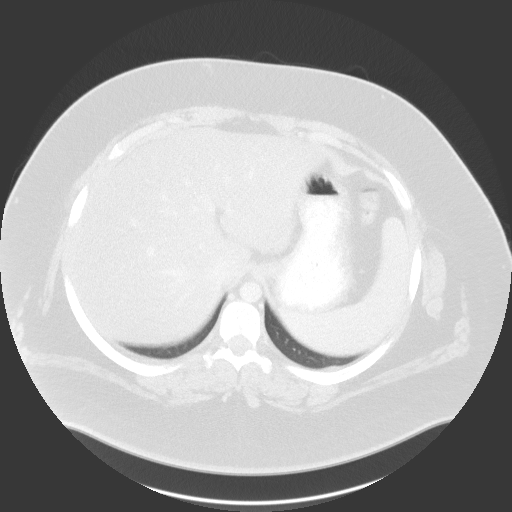
[im 105/115  lung]
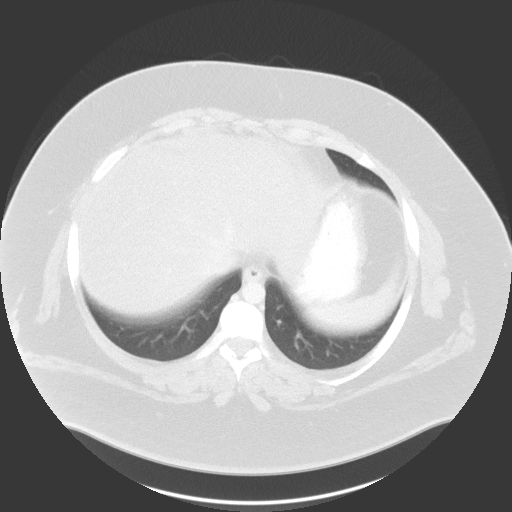
[im 110/115  soft-tissue]
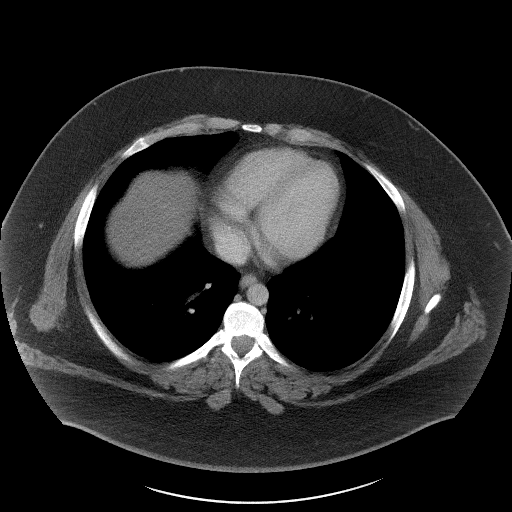
[im 110/115  lung]
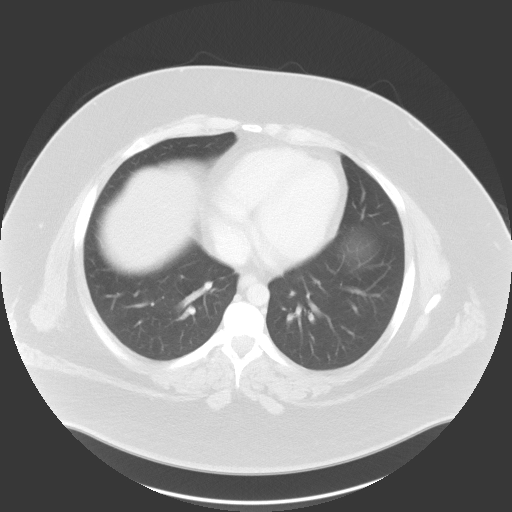

[15 of 32 positions shown; findings below may reference images not displayed]

FINDINGS: Lower chest: Normal heart size. No consolidative pulmonary
opacities. No pleural effusion.

Hepatobiliary: Liver is diffusely low in attenuation compatible with
steatosis. No focal hepatic lesion is identified. Gallbladder is
decompressed.

Pancreas: Unremarkable

Spleen: Unremarkable

Adrenals/Urinary Tract: The adrenal glands are normal. Kidneys
enhance symmetrically with contrast. No hydronephrosis. Urinary
bladder is unremarkable.

Stomach/Bowel: Stomach is within normal limits. Appendix appears
normal. No evidence of bowel wall thickening, distention, or
inflammatory changes.

Vascular/Lymphatic: Normal caliber abdominal aorta. Interval
decrease in previously described retroperitoneal adenopathy with a
reference lymph node measuring 11 mm (image 48; series 2),
previously 13 mm. Adjacent aortocaval lymph node measures 8 mm
(image 46; series 2), previously 14 mm. Peripancreatic lymph node
measures 13 mm, previously 16 mm (image 27; series 2).

Reproductive: Prostate is unremarkable.

Other: No abdominal wall hernia or abnormality. No abdominopelvic
ascites.

Musculoskeletal: No aggressive or acute appearing osseous lesions.
IMPRESSION: Interval decrease in size of previously described prominent
retroperitoneal lymph nodes, favored to be secondary to an improving
reactive process.

Hepatic steatosis.

## 2018-01-29 ENCOUNTER — Other Ambulatory Visit: Payer: Self-pay | Admitting: Neurology

## 2018-02-20 ENCOUNTER — Other Ambulatory Visit: Payer: Self-pay | Admitting: Internal Medicine

## 2018-02-20 ENCOUNTER — Encounter: Payer: Self-pay | Admitting: *Deleted

## 2018-02-20 NOTE — Telephone Encounter (Signed)
Sent pt a mychart message. 

## 2018-02-20 NOTE — Telephone Encounter (Signed)
Last seen on 05/24/17. Cancelled appt in Decemeber. No future appt scheduled

## 2018-02-20 NOTE — Telephone Encounter (Signed)
Please notify patient that the prescription  was Refilled for 30 days only because it has been 9 months since last visit. .  OFFICE VISIT NEEDED prior to any more refills  

## 2018-03-05 ENCOUNTER — Other Ambulatory Visit: Payer: Self-pay | Admitting: Internal Medicine

## 2018-03-05 NOTE — Telephone Encounter (Signed)
Last visit on 02-17-18, this was last filled on 12-29-17 , patient does not have an appt scheduled. Would you like to refill?

## 2018-03-05 NOTE — Telephone Encounter (Signed)
Please notify patient that the prescriptions were  Refilled for 60 days only because theyrequire a  6 month follow   OFFICE VISIT NEEDED prior to any more refills

## 2018-03-27 ENCOUNTER — Other Ambulatory Visit: Payer: Self-pay | Admitting: Internal Medicine

## 2018-03-30 ENCOUNTER — Other Ambulatory Visit: Payer: Self-pay | Admitting: Internal Medicine

## 2018-03-30 ENCOUNTER — Encounter (INDEPENDENT_AMBULATORY_CARE_PROVIDER_SITE_OTHER): Payer: Self-pay

## 2018-03-30 ENCOUNTER — Ambulatory Visit (INDEPENDENT_AMBULATORY_CARE_PROVIDER_SITE_OTHER): Payer: 59 | Admitting: Internal Medicine

## 2018-03-30 ENCOUNTER — Encounter: Payer: Self-pay | Admitting: Internal Medicine

## 2018-03-30 VITALS — BP 120/82 | HR 107 | Temp 98.2°F | Resp 16 | Ht 75.0 in | Wt >= 6400 oz

## 2018-03-30 DIAGNOSIS — F411 Generalized anxiety disorder: Secondary | ICD-10-CM

## 2018-03-30 DIAGNOSIS — K760 Fatty (change of) liver, not elsewhere classified: Secondary | ICD-10-CM | POA: Diagnosis not present

## 2018-03-30 DIAGNOSIS — R0683 Snoring: Secondary | ICD-10-CM

## 2018-03-30 DIAGNOSIS — I10 Essential (primary) hypertension: Secondary | ICD-10-CM | POA: Diagnosis not present

## 2018-03-30 LAB — COMPREHENSIVE METABOLIC PANEL
ALT: 48 U/L (ref 0–53)
AST: 28 U/L (ref 0–37)
Albumin: 4.1 g/dL (ref 3.5–5.2)
Alkaline Phosphatase: 54 U/L (ref 39–117)
BUN: 12 mg/dL (ref 6–23)
CHLORIDE: 107 meq/L (ref 96–112)
CO2: 23 meq/L (ref 19–32)
Calcium: 9.2 mg/dL (ref 8.4–10.5)
Creatinine, Ser: 0.89 mg/dL (ref 0.40–1.50)
GFR: 113.64 mL/min (ref >60.00)
GLUCOSE: 109 mg/dL — AB (ref 70–99)
Potassium: 3.8 mEq/L (ref 3.5–5.1)
SODIUM: 139 meq/L (ref 135–145)
Total Bilirubin: 0.6 mg/dL (ref 0.2–1.2)
Total Protein: 7.1 g/dL (ref 6.0–8.3)

## 2018-03-30 LAB — LIPID PANEL
CHOL/HDL RATIO: 4
CHOLESTEROL: 137 mg/dL (ref 0–200)
HDL: 33.5 mg/dL — ABNORMAL LOW (ref >39.00)
LDL CALC: 78 mg/dL (ref 0–99)
NONHDL: 103.97
Triglycerides: 128 mg/dL (ref 0.0–149.0)
VLDL: 25.6 mg/dL (ref 0.0–40.0)

## 2018-03-30 LAB — MICROALBUMIN / CREATININE URINE RATIO
Creatinine,U: 181.7 mg/dL
MICROALB UR: 1 mg/dL (ref 0.0–1.9)
Microalb Creat Ratio: 0.5 mg/g (ref 0.0–30.0)

## 2018-03-30 LAB — TSH: TSH: 2.03 u[IU]/mL (ref 0.35–4.50)

## 2018-03-30 LAB — HEMOGLOBIN A1C: HEMOGLOBIN A1C: 5.5 % (ref 4.6–6.5)

## 2018-03-30 MED ORDER — SERTRALINE HCL 100 MG PO TABS: 200.0000 mg | ORAL_TABLET | Freq: Every day | ORAL | 1 refills | Status: DC

## 2018-03-30 NOTE — Progress Notes (Signed)
Subjective:  Patient ID: Larry Rogers, male    DOB: Jan 16, 1996  Age: 22 y.o. MRN: [id number]  CC: The primary encounter diagnosis was Hepatic steatosis. Diagnoses of Morbid obesity (HCC), Essential hypertension, benign, GAD (generalized anxiety disorder), and Snoring were also pertinent to this visit.  HPI Larry Rogers presents for FOLLOW UP on GAD with  managed with zoloft  200 mg daily,  morbid obesity BMI 64  Has had a 15 lb weight gain since November, and 130 lbs since September 2014  Referred to psychology in September for counselling .  Did not go.  Not sure why . Feels his anxiety is better controlled lately.  Has been less worried about various physical symptoms being a sign of a health problem.    Has been less socially isolated,  Making some friends but mostly online.  Feels confident enough about his condition to counsel others about their anxiety.  Still has days where he does not leave the house despite being umenployed.  Lives with parents,  Average 3 outings per week.  Admits to Not working very hard to find a job. Cites  "laziness" as the reason.  Would like to work in Engineering geologist.  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 States that he was one of the best employees they had . 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  Quit 2 years ago due to conflicts with manager ; felt he was being "picked on." feels like he was the scapegoat for everything that went wrong in the store.  Tried Glass blower/designer ,  Made it worse, so he finally quit. Spends 4 to 5  Hours per day using Internet, browsing.  Not exercising regularly despite family now relocated to an apartment complex with a swimming POOL.      Diet reviewed.  Admits he has fallen back into "bad habits."  Drinking soda.    Averages 1 can per meal plus 2 more in between!  His parents are buying the soda, discussed the roles each of them play in his addiction to food  . Also cites problems with portion control ,  averages  2 to 3 plates at dinner.   Eats too fast as well.  Skips breakfast.  Eats lunch and dinner and 2 snacks.  Snacks have been chips, "  Light " Yogurt if available.  The only fruit he eats are  bananas.  Outpatient Medications Prior to Visit  Medication Sig Dispense Refill  . albuterol (PROVENTIL HFA;VENTOLIN HFA) 108 (90 Base) MCG/ACT inhaler Inhale 2 puffs every 6 (six) hours as needed into the lungs for wheezing or shortness of breath. 1 Inhaler 0  . busPIRone (BUSPAR) 7.5 MG tablet TAKE 2 TABLETS (15 MG TOTAL) BY MOUTH 3 (THREE) TIMES DAILY. 180 tablet 1  . ergocalciferol (VITAMIN D2) 50000 units capsule Take 50,000 Units by mouth once a week.    . levalbuterol (XOPENEX HFA) 45 MCG/ACT inhaler Inhale 2 puffs into the lungs every 6 (six) hours as needed for wheezing. 1 Inhaler 12  . lisinopril (PRINIVIL,ZESTRIL) 10 MG tablet TAKE 1 TABLET (10 MG TOTAL) BY MOUTH DAILY. 90 tablet 1  . montelukast (SINGULAIR) 10 MG tablet Take 1 tablet (10 mg total) by mouth at bedtime. 90 tablet 4  . QUDEXY XR 150 MG CS24 sprinkle capsule TAKE 150 MG BY MOUTH DAILY. 30 capsule 3  . metoprolol succinate (TOPROL-XL) 25 MG 24 hr tablet TAKE 1 TABLET (25 MG  TOTAL) BY MOUTH DAILY. 90 tablet 1  . sertraline (ZOLOFT) 100 MG tablet TAKE 2 TABLETS BY MOUTH EVERY DAY 180 tablet 1  . predniSONE (DELTASONE) 10 MG tablet Take 6 tabs day 1, 5 tabs day 2, 4 tabs day 3, 3 tabs day 4, 2 tabs day 5, 1 tab day 6 (Patient not taking: Reported on 03/30/2018) 21 tablet 0  . sertraline (ZOLOFT) 100 MG tablet TAKE 1 TABLET BY MOUTH EVERY DAY (Patient not taking: Reported on 03/30/2018) 30 tablet 0  . Topiramate ER (QUDEXY XR) 50 MG CS24 sprinkle capsule Take 150 mg by mouth daily. (Patient not taking: Reported on 03/30/2018) 90 each 0   No facility-administered medications prior to visit.     Review of Systems;  Patient denies headache, fevers, malaise, unintentional weight loss, skin rash, eye pain, sinus congestion and sinus  pain, sore throat, dysphagia,  hemoptysis , cough, dyspnea, wheezing, chest pain, palpitations, orthopnea, edema, abdominal pain, nausea, melena, diarrhea, constipation, flank pain, dysuria, hematuria, urinary  Frequency, nocturia, numbness, tingling, seizures,  Focal weakness, Loss of consciousness,  Tremor, insomnia, depression, anxiety, and suicidal ideation.      Objective:  BP 120/82 (BP Location: Left Arm, Patient Position: Sitting, Cuff Size: Large)   Pulse (!) 107   Temp 98.2 F (36.8 C) (Oral)   Resp 16   Ht 6\' 3"  (1.905 m)   Wt (!) 517 lb 9.6 oz (234.8 kg)   SpO2 98%   BMI 64.70 kg/m   BP Readings from Last 3 Encounters:  03/30/18 120/82  07/31/17 128/90  07/07/17 128/90    Wt Readings from Last 3 Encounters:  03/30/18 (!) 517 lb 9.6 oz (234.8 kg)  07/31/17 (!) 501 lb (227.3 kg)  07/07/17 (!) 495 lb 12.8 oz (224.9 kg)    General appearance: alert, cooperative and appears stated age. MORBIDLY OBESE  Ears: normal TM's and external ear canals both ears Throat: lips, mucosa, and tongue normal; teeth and gums normal Neck: no adenopathy, no carotid bruit, supple, symmetrical, trachea midline and thyroid not enlarged, symmetric, no tenderness/mass/nodules Back: symmetric, no curvature. ROM normal. No CVA tenderness. Lungs: clear to auscultation bilaterally Heart: regular rate and rhythm, S1, S2 normal, no murmur, click, rub or gallop Abdomen: soft, non-tender; bowel sounds normal; no masses,  no organomegaly Pulses: 2+ and symmetric Skin: Skin color, texture, turgor normal. No rashes or lesions Lymph nodes: Cervical, supraclavicular, and axillary nodes normal.  Lab Results  Component Value Date   HGBA1C 5.5 03/30/2018   HGBA1C 5.6 08/20/2017   HGBA1C 5.0 06/21/2014    Lab Results  Component Value Date   CREATININE 0.89 03/30/2018   CREATININE 0.86 08/20/2017   CREATININE 0.92 12/16/2016    Lab Results  Component Value Date   WBC 7.1 07/25/2016   HGB 15.6  07/25/2016   HCT 44.2 07/25/2016   PLT 283 07/25/2016   GLUCOSE 109 (H) 03/30/2018   CHOL 137 03/30/2018   TRIG 128.0 03/30/2018   HDL 33.50 (L) 03/30/2018   LDLCALC 78 03/30/2018   ALT 48 03/30/2018   AST 28 03/30/2018   NA 139 03/30/2018   K 3.8 03/30/2018   CL 107 03/30/2018   CREATININE 0.89 03/30/2018   BUN 12 03/30/2018   CO2 23 03/30/2018   TSH 2.03 03/30/2018   HGBA1C 5.5 03/30/2018   MICROALBUR 1.0 03/30/2018    Ct Abdomen Pelvis W Contrast  Result Date: 09/13/2016 CLINICAL DATA:  Patient with history of lymphadenopathy. EXAM: CT ABDOMEN AND PELVIS  WITH CONTRAST TECHNIQUE: Multidetector CT imaging of the abdomen and pelvis was performed using the standard protocol following bolus administration of intravenous contrast. CONTRAST:  125 cc Isovue 370 COMPARISON:  CT abdomen pelvis 07/01/2016. FINDINGS: Lower chest: Normal heart size. No consolidative pulmonary opacities. No pleural effusion. Hepatobiliary: Liver is diffusely low in attenuation compatible with steatosis. No focal hepatic lesion is identified. Gallbladder is decompressed. Pancreas: Unremarkable Spleen: Unremarkable Adrenals/Urinary Tract: The adrenal glands are normal. Kidneys enhance symmetrically with contrast. No hydronephrosis. Urinary bladder is unremarkable. Stomach/Bowel: Stomach is within normal limits. Appendix appears normal. No evidence of bowel wall thickening, distention, or inflammatory changes. Vascular/Lymphatic: Normal caliber abdominal aorta. Interval decrease in previously described retroperitoneal adenopathy with a reference lymph node measuring 11 mm (image 48; series 2), previously 13 mm. Adjacent aortocaval lymph node measures 8 mm (image 46; series 2), previously 14 mm. Peripancreatic lymph node measures 13 mm, previously 16 mm (image 27; series 2). Reproductive: Prostate is unremarkable. Other: No abdominal wall hernia or abnormality. No abdominopelvic ascites. Musculoskeletal: No aggressive or  acute appearing osseous lesions. IMPRESSION: Interval decrease in size of previously described prominent retroperitoneal lymph nodes, favored to be secondary to an improving reactive process. Hepatic steatosis. Electronically Signed   By: Annia Belt M.D.   On: 09/13/2016 09:53    Assessment & Plan:   Problem List Items Addressed This Visit    Essential hypertension, benign    Borderline control on current regimen of lisinopril 10 mg and Toprol 25 mg. encouraged to start exercising   Lab Results  Component Value Date   CREATININE 0.89 03/30/2018   BUN 12 03/30/2018   NA 139 03/30/2018   K 3.8 03/30/2018   CL 107 03/30/2018   CO2 23 03/30/2018   Lab Results  Component Value Date   MICROALBUR 1.0 03/30/2018   MALB24HUR 10.1 03/04/2013        Relevant Orders   Microalbumin / creatinine urine ratio (Completed)   GAD (generalized anxiety disorder)    Managed with zoloft 200 mg  Dose was titrated by Dr Maryruth Bun before the therapeutic relationship as terminated .  Refills given. Continue buspirone as well.        Relevant Medications   sertraline (ZOLOFT) 100 MG tablet   Hepatic steatosis - Primary   Relevant Orders   Comprehensive metabolic panel (Completed)   Lipid panel (Completed)   Morbid obesity (HCC)    aggravated by family's co dependent behaviors (buying him soda, allowing him to live at home without working or even exercising daily )  and patient non adherence to diet and exercise plan.  I am referring him to Weight management.  He was turned down by insurance last year for bariatric surgery.        Relevant Orders   Amb Ref to Medical Weight Management   Hemoglobin A1c (Completed)   TSH (Completed)   Snoring    Home sleep  Study by [facility] was negative for OSA in 2016        A total of 40 minutes of face to face time was spent with patient more than half of which was spent in counselling about the above mentioned conditions  and coordination of care   I have  discontinued Joseph C. Garver's topiramate ER, predniSONE, and sertraline. I have also changed his sertraline. Additionally, I am having him maintain his montelukast, ergocalciferol, levalbuterol, albuterol, lisinopril, QUDEXY XR, and busPIRone.  Meds ordered this encounter  Medications  . sertraline (ZOLOFT) 100 MG  tablet    Sig: Take 2 tablets (200 mg total) by mouth daily.    Dispense:  180 tablet    Refill:  1    Medications Discontinued During This Encounter  Medication Reason  . predniSONE (DELTASONE) 10 MG tablet Completed Course  . sertraline (ZOLOFT) 100 MG tablet Change in therapy  . Topiramate ER (QUDEXY XR) 50 MG CS24 sprinkle capsule Duplicate  . sertraline (ZOLOFT) 100 MG tablet Reorder    Follow-up: Return in about 6 months (around 09/30/2018) for follow up hypertension .   Sherlene Shamseresa L , MD

## 2018-03-30 NOTE — Patient Instructions (Signed)
I WANT YOU TO SPEND 30 MINUTES IN THE POOL EVERY DAY WALKING AT THE VERY LEAST.  YOUR FAMILY NEEDS TO STOP BUYING THE FOODS THAT YOU SHOULD NOT BE EATING OR DRINKING:  CARBONATED SODAS  CHIPS OF ANY KIND   I AM MAKING A REFERRAL TO DR BEASLEY TO HELP YOU GET THE WEIGHT DOWN

## 2018-03-31 ENCOUNTER — Other Ambulatory Visit: Payer: Self-pay | Admitting: Internal Medicine

## 2018-03-31 NOTE — Assessment & Plan Note (Signed)
Managed with zoloft 200 mg  Dose was titrated by Dr Maryruth BunKapur before the therapeutic relationship as terminated .  Refills given. Continue buspirone as well.

## 2018-03-31 NOTE — Assessment & Plan Note (Signed)
Home sleep  Study by [facility] was negative for OSA in 2016

## 2018-03-31 NOTE — Assessment & Plan Note (Signed)
Borderline control on current regimen of lisinopril 10 mg and Toprol 25 mg. encouraged to start exercising   Lab Results  Component Value Date   CREATININE 0.89 03/30/2018   BUN 12 03/30/2018   NA 139 03/30/2018   K 3.8 03/30/2018   CL 107 03/30/2018   CO2 23 03/30/2018   Lab Results  Component Value Date   MICROALBUR 1.0 03/30/2018   MALB24HUR 10.1 03/04/2013

## 2018-03-31 NOTE — Assessment & Plan Note (Signed)
aggravated by family's co dependent behaviors (buying him soda, allowing him to live at home without working or even exercising daily )  and patient non adherence to diet and exercise plan.  I am referring him to Weight management.  He was turned down by insurance last year for bariatric surgery.

## 2018-04-17 ENCOUNTER — Other Ambulatory Visit: Payer: Self-pay | Admitting: Internal Medicine

## 2018-04-17 DIAGNOSIS — F411 Generalized anxiety disorder: Secondary | ICD-10-CM

## 2018-04-21 ENCOUNTER — Ambulatory Visit (INDEPENDENT_AMBULATORY_CARE_PROVIDER_SITE_OTHER): Payer: 59 | Admitting: Neurology

## 2018-04-21 ENCOUNTER — Encounter: Payer: Self-pay | Admitting: Neurology

## 2018-04-21 VITALS — BP 126/90 | HR 84 | Ht 75.0 in | Wt >= 6400 oz

## 2018-04-21 DIAGNOSIS — G43009 Migraine without aura, not intractable, without status migrainosus: Secondary | ICD-10-CM | POA: Diagnosis not present

## 2018-04-21 NOTE — Patient Instructions (Signed)
Continue Qudexy 150mg  at bedtime Limit pain relievers to no more than 2 days out of week to prevent rebound headache Keep headache diary Follow up in one year or as needed.

## 2018-04-21 NOTE — Progress Notes (Signed)
NEUROLOGY FOLLOW UP OFFICE NOTE  Daun Peacocklston C Miranda [id number]  HISTORY OF PRESENT ILLNESS: Larry Rogers is a 22 year old right-handed male with hypertension, hepatic steatosis, morbid obesity and anxiety who follows up for migraine.   UPDATE: Due to paresthesias (he notices it on his forehead, bridge of nose and inner wrists when he goes to bed at night.  It is resolved when he wakes up), IR topiramate was switched to ER topiramate to minimize this side effect.  Symptoms have improved.  He is doing well Intensity:  3/10 Duration:  20 seconds on and off for an hour Frequency:  2 to 4 days a month Current abortive medication:  sometimes Tylenol, but usually none Antihypertensive medications:  none Antidepressant medications:  Sertraline 50mg  Anticonvulsant medications:  topramate ER 150mg  Vitamins/Herbal/Supplements:  none Other therapy:  none Other medication: BuSpar     HISTORY: Onset:  5 months ago. Location:  Right temporal/parietal region Quality:  Sharp/stabbing Initial Intensity:  7/10; August: 4.5/10 Aura:  no Prodrome:  no Associated symptoms:  Right eye tears and twitches, dilated right pupil.  Mild photophobia.  He denies double vision.  He has longstanding history of seeing floaters. Initial Duration:  Off and on throughout the day; August: 2 hours Initial Frequency:  Daily.  Goes to sleep and wakes up with it.; August: 2 days per month Relieving factors:  Laying down and napping Activity:  Able to function.     Past abortive medication:  Tylenol, Excedrin, Advil, Ibuprofen (ineffective)   He had a CT of the head without contrast performed on 02/12/15 for the headaches, as well as reported left-sided numbness.  It was negative.   MRI of brain with and without contrast from 03/23/15 was normal.   PAST MEDICAL HISTORY: Past Medical History:  Diagnosis Date  . Acne    [place] [facility]  . Anxiety   . Anxiety   . Asthma    seasonal allergies, Dx age 89  . Depression     . Fatty liver   . GERD (gastroesophageal reflux disease)   . Headache   . Hypertension 2012   Dr. Kathrene AluArchinald    MEDICATIONS: Current Outpatient Medications on File Prior to Visit  Medication Sig Dispense Refill  . albuterol (PROVENTIL HFA;VENTOLIN HFA) 108 (90 Base) MCG/ACT inhaler Inhale 2 puffs every 6 (six) hours as needed into the lungs for wheezing or shortness of breath. 1 Inhaler 0  . busPIRone (BUSPAR) 7.5 MG tablet TAKE 2 TABLETS (15 MG TOTAL) BY MOUTH 3 (THREE) TIMES DAILY. 540 tablet 1  . ergocalciferol (VITAMIN D2) 50000 units capsule Take 50,000 Units by mouth once a week.    . levalbuterol (XOPENEX HFA) 45 MCG/ACT inhaler Inhale 2 puffs into the lungs every 6 (six) hours as needed for wheezing. 1 Inhaler 12  . lisinopril (PRINIVIL,ZESTRIL) 10 MG tablet TAKE 1 TABLET (10 MG TOTAL) BY MOUTH DAILY. 90 tablet 1  . metoprolol succinate (TOPROL-XL) 25 MG 24 hr tablet TAKE 1 TABLET (25 MG TOTAL) BY MOUTH DAILY. 90 tablet 1  . montelukast (SINGULAIR) 10 MG tablet Take 1 tablet (10 mg total) by mouth at bedtime. 90 tablet 4  . QUDEXY XR 150 MG CS24 sprinkle capsule TAKE 150 MG BY MOUTH DAILY. 30 capsule 3  . sertraline (ZOLOFT) 100 MG tablet Take 2 tablets (200 mg total) by mouth daily. 180 tablet 1   No current facility-administered medications on file prior to visit.     ALLERGIES: No Known Allergies  FAMILY  HISTORY: Family History  Problem Relation Age of Onset  . Hypertension Father   . Heart disease Paternal Grandfather   . Hypertension Paternal Grandfather   . Aortic aneurysm Paternal Grandfather   . Diabetes Mother   . Diabetes Maternal Uncle        x2  . Colon cancer Neg Hx   . Colon polyps Neg Hx   . Esophageal cancer Neg Hx   . Kidney disease Neg Hx   . Gallbladder disease Neg Hx     SOCIAL HISTORY: Social History   Socioeconomic History  . Marital status: Single    Spouse name: Not on file  . Number of children: 0  . Years of education: Not on file   . Highest education level: Not on file  Occupational History  . Occupation: Consulting civil engineer    Comment: [employer]  Social Needs  . Financial resource strain: Not on file  . Food insecurity:    Worry: Not on file    Inability: Not on file  . Transportation needs:    Medical: Not on file    Non-medical: Not on file  Tobacco Use  . Smoking status: Never Smoker  . Smokeless tobacco: Never Used  Substance and Sexual Activity  . Alcohol use: No    Alcohol/week: 0.0 oz  . Drug use: No  . Sexual activity: Never  Lifestyle  . Physical activity:    Days per week: Not on file    Minutes per session: Not on file  . Stress: Not on file  Relationships  . Social connections:    Talks on phone: Not on file    Gets together: Not on file    Attends religious service: Not on file    Active member of club or organization: Not on file    Attends meetings of clubs or organizations: Not on file    Relationship status: Not on file  . Intimate partner violence:    Fear of current or ex partner: Not on file    Emotionally abused: Not on file    Physically abused: Not on file    Forced sexual activity: Not on file  Other Topics Concern  . Not on file  Social History Narrative   Lives in [place] family. Pets - dog and cat.      School - [employer], rising senior. Plans to major in accountant.      Diet - regular, tries to limit processed sugars      Exercise - started basketball, swimming    REVIEW OF SYSTEMS: Constitutional: No fevers, chills, or sweats, no generalized fatigue, change in appetite Eyes: No visual changes, double vision, eye pain Ear, nose and throat: No hearing loss, ear pain, nasal congestion, sore throat Cardiovascular: No chest pain, palpitations Respiratory:  No shortness of breath at rest or with exertion, wheezes GastrointestinaI: No nausea, vomiting, diarrhea, abdominal pain, fecal incontinence Genitourinary:  No dysuria, urinary retention or frequency Musculoskeletal:   No neck pain, back pain Integumentary: No rash, pruritus, skin lesions Neurological: as above Psychiatric: No depression, insomnia, anxiety Endocrine: No palpitations, fatigue, diaphoresis, mood swings, change in appetite, change in weight, increased thirst Hematologic/Lymphatic:  No purpura, petechiae. Allergic/Immunologic: no itchy/runny eyes, nasal congestion, recent allergic reactions, rashes  PHYSICAL EXAM: Vitals:   04/21/18 0852  BP: 126/90  Pulse: 84  SpO2: 97%   General: No acute distress.  Patient appears well-groomed.  . Head:  Normocephalic/atraumatic Eyes:  Fundi examined but not visualized Neck: supple, no  paraspinal tenderness, full range of motion Heart:  Regular rate and rhythm Lungs:  Clear to auscultation bilaterally Back: No paraspinal tenderness Neurological Exam: alert and oriented to person, place, and time. Attention span and concentration intact, recent and remote memory intact, fund of knowledge intact.  Speech fluent and not dysarthric, language intact.  CN II-XII intact. Bulk and tone normal, muscle strength 5/5 throughout.  Sensation to light touch  intact.  Deep tendon reflexes 2+ throughout, toes downgoing.  Finger to nose testing intact.  Gait normal, Romberg negative.  IMPRESSION: Migraine without aura, without status migrainosus, not intractable Morbid obesity (BMI 64.25 kg/m2)  PLAN: Continue Qudexy 150mg  at bedtime Limit pain relievers to no more than 2 days out of week to prevent rebound headache Keep headache diary Weight loss Follow up in one year or as needed.  Shon Millet, DO  CC:  Duncan Dull, MD

## 2018-04-28 ENCOUNTER — Ambulatory Visit (INDEPENDENT_AMBULATORY_CARE_PROVIDER_SITE_OTHER): Payer: 59 | Admitting: Family Medicine

## 2018-04-28 ENCOUNTER — Encounter: Payer: Self-pay | Admitting: Family Medicine

## 2018-04-28 VITALS — BP 110/68 | HR 92 | Temp 98.4°F | Resp 15 | Wt >= 6400 oz

## 2018-04-28 DIAGNOSIS — I10 Essential (primary) hypertension: Secondary | ICD-10-CM | POA: Diagnosis not present

## 2018-04-28 DIAGNOSIS — R600 Localized edema: Secondary | ICD-10-CM

## 2018-04-28 MED ORDER — HYDROCHLOROTHIAZIDE 12.5 MG PO TABS: 12.5000 mg | ORAL_TABLET | Freq: Every day | ORAL | 3 refills | Status: DC

## 2018-04-28 NOTE — Progress Notes (Signed)
   Subjective:    Patient ID: Larry Rogers, male    DOB: [birth year], 22 y.o.   MRN: [id number]  HPI  Patient presents to clinic complaining of lower extremity edema, getting worse throughout the day when he is sitting and feet are on the ground.  Does notice improvement with elevation of feet.  Denies any cough, shortness of breath, chest pain.  He takes lisinopril and metoprolol for blood pressure control.   Patient Active Problem List   Diagnosis Date Noted  . Bilateral lower extremity edema 12/17/2016  . Hepatic steatosis 12/17/2016  . 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 Lower abdominal pain 07/01/2016  . Headache 11/03/2015  . Migraine without aura and without status migrainosus, not intractable 08/07/2015  . Snoring 07/04/2015  . Palpitations 06/14/2015  . Thyroid dysfunction 06/14/2015  . GAD (generalized anxiety disorder) 02/14/2015  . Seasonal allergies 12/20/2014  . Insomnia 08/30/2014  . Gastroesophageal reflux disease without esophagitis 08/15/2014  . Essential hypertension, benign 02/25/2013  . Acne 02/25/2013  . Morbid obesity (HCC) 02/25/2013   Social History   Tobacco Use  . Smoking status: Never Smoker  . Smokeless tobacco: Never Used  Substance Use Topics  . Alcohol use: No    Alcohol/week: 0.0 standard drinks   Review of Systems   Constitutional: Negative for chills, fatigue and fever.  HENT: Negative for congestion, ear pain, sinus pain and sore throat.   Eyes: Negative.   Respiratory: Negative for cough, shortness of breath and wheezing.   Cardiovascular: Negative for chest pain, palpitations. +leg swelling.  Gastrointestinal: Negative for abdominal pain, diarrhea, nausea and vomiting.  Genitourinary: Negative for dysuria, frequency and urgency.  Musculoskeletal: Negative for arthralgias and myalgias.  Skin: Negative for color change, pallor and rash.  Neurological: Negative for syncope, light-headedness and headaches.    Psychiatric/Behavioral: The patient is not nervous/anxious.      Objective:   Physical Exam  Constitutional: He is oriented to person, place, and time. He appears well-developed and well-nourished. No distress. Morbidly obese. HENT:  Head: Normocephalic and atraumatic.  Neck: Normal range of motion. Neck supple. No tracheal deviation present.  Cardiovascular: Normal rate, regular rhythm and normal heart sounds. +2 non pitting edema bilat ankles/feet Pulmonary/Chest: Effort normal and breath sounds normal. No respiratory distress. He has no wheezes. He has no rales.  Neurological: He is alert and oriented to person, place, and time.  Gait normal  Skin: Skin is warm and dry. He is not diaphoretic. No pallor.  Psychiatric: He has a normal mood and affect. His behavior is normal. Thought content normal.  Nursing note and vitals reviewed.  Vitals:   04/28/18 0812  BP: 110/68  Pulse: 92  Resp: 15  Temp: 98.4 F (36.9 C)  SpO2: 97%   Filed Weights   04/28/18 0812  Weight: (!) 515 lb (233.6 kg)      Assessment & Plan:   Edema -elevate legs whenever seated.  Keep self active by doing daily exercise.  Drink plenty of water.  We will add low-dose hydrochlorothiazide to see if this helps edema.  Hypertension- continue lisinopril and metoprolol as already prescribed.  Morbid obesity - Keep self physically active and eat healthy balanced diet (lots of water, lean protein, veggies, fruits)   Follow-up in approximately 2 months for medication recheck and lab work.

## 2018-04-28 NOTE — Patient Instructions (Addendum)
Great to meet you!  Elevate feet whenever able  Keep self active and drink plenty of water  Take HCTZ in AM to help LE swelling

## 2018-05-14 ENCOUNTER — Other Ambulatory Visit: Payer: Self-pay | Admitting: Internal Medicine

## 2018-05-26 ENCOUNTER — Encounter (INDEPENDENT_AMBULATORY_CARE_PROVIDER_SITE_OTHER): Payer: Self-pay

## 2018-06-11 ENCOUNTER — Other Ambulatory Visit: Payer: Self-pay | Admitting: Neurology

## 2018-06-17 ENCOUNTER — Other Ambulatory Visit: Payer: Self-pay | Admitting: Internal Medicine

## 2018-06-19 MED ORDER — SERTRALINE HCL 100 MG PO TABS: 200.0000 mg | ORAL_TABLET | Freq: Every day | ORAL | 1 refills | Status: DC

## 2018-06-29 ENCOUNTER — Ambulatory Visit (INDEPENDENT_AMBULATORY_CARE_PROVIDER_SITE_OTHER): Payer: 59 | Admitting: Internal Medicine

## 2018-06-29 ENCOUNTER — Encounter: Payer: Self-pay | Admitting: Internal Medicine

## 2018-06-29 VITALS — BP 126/76 | HR 111 | Temp 98.6°F | Resp 16 | Ht 75.0 in | Wt >= 6400 oz

## 2018-06-29 DIAGNOSIS — I10 Essential (primary) hypertension: Secondary | ICD-10-CM

## 2018-06-29 DIAGNOSIS — R600 Localized edema: Secondary | ICD-10-CM

## 2018-06-29 DIAGNOSIS — Z79899 Other long term (current) drug therapy: Secondary | ICD-10-CM

## 2018-06-29 DIAGNOSIS — Z23 Encounter for immunization: Secondary | ICD-10-CM

## 2018-06-29 MED ORDER — FUROSEMIDE 20 MG PO TABS: 20.0000 mg | ORAL_TABLET | Freq: Every day | ORAL | 3 refills | Status: DC

## 2018-06-29 NOTE — Assessment & Plan Note (Signed)
Worsening,  Likely due to venous stasis with or without diastolic dysfunction,  Lung exam is clear.  Lasix 20 mg daily instead of hctz and repeat the referral to vascular surgery that he declined last year.

## 2018-06-29 NOTE — Assessment & Plan Note (Addendum)
He has gained 150 lbs since 2015 and has finally been approved for bariatric surgery and a lap duodenal switch is tentatively planned by Dr Smitty Cords.  Psychology and cardiology evaluations planned , as well as sleep study.

## 2018-06-29 NOTE — Patient Instructions (Addendum)
Stop the hctz and use the furosemide instead once daily in the morning    Vascular surgery referral is needed  to evaluate for venous insufficiency    Return for repeat blood work in 2 weeks

## 2018-06-29 NOTE — Progress Notes (Signed)
Subjective:  Patient ID: Larry Rogers, male    DOB: 01-08-1996  Age: 22 y.o. MRN: [id number]  CC: The primary encounter diagnosis was Long term current use of diuretic. Diagnoses of Essential hypertension, benign, Bilateral lower extremity edema, Need for immunization against influenza, and Morbid obesity (HCC) were also pertinent to this visit.  HPI Larry Rogers presents for follow up on hypertension, anxiety and obesity   He was started on hctz in mid august by LG for edema  .  Already taking lisinopril and metoprolol .  He is  acc'd by mother today.  He has been urinating more,  But has developed a "rash" on left foot and has gained 2 lbs since last visit.  Denies orthopnea .     Bariatric surgery planned.  Lap duodenal switch.  In preparation,  Sleep study , cardiology and psychologic evaluations were ordered.      Outpatient Medications Prior to Visit  Medication Sig Dispense Refill  . albuterol (PROVENTIL HFA;VENTOLIN HFA) 108 (90 Base) MCG/ACT inhaler Inhale 2 puffs every 6 (six) hours as needed into the lungs for wheezing or shortness of breath. 1 Inhaler 0  . busPIRone (BUSPAR) 7.5 MG tablet TAKE 2 TABLETS (15 MG TOTAL) BY MOUTH 3 (THREE) TIMES DAILY. 540 tablet 1  . Cholecalciferol (VITAMIN D3) 5000 units CAPS Take 1 capsule by mouth daily.    Marland Kitchen levalbuterol (XOPENEX HFA) 45 MCG/ACT inhaler Inhale 2 puffs into the lungs every 6 (six) hours as needed for wheezing. 1 Inhaler 12  . lisinopril (PRINIVIL,ZESTRIL) 10 MG tablet TAKE 1 TABLET (10 MG TOTAL) BY MOUTH DAILY. 90 tablet 1  . metoprolol succinate (TOPROL-XL) 25 MG 24 hr tablet TAKE 1 TABLET (25 MG TOTAL) BY MOUTH DAILY. 90 tablet 1  . montelukast (SINGULAIR) 10 MG tablet Take 1 tablet (10 mg total) by mouth at bedtime. 90 tablet 4  . QUDEXY XR 150 MG CS24 sprinkle capsule TAKE 150 MG BY MOUTH DAILY. 30 capsule 5  . sertraline (ZOLOFT) 100 MG tablet Take 2 tablets (200 mg total) by mouth daily. 180 tablet 1  .  hydrochlorothiazide (HYDRODIURIL) 12.5 MG tablet Take 1 tablet (12.5 mg total) by mouth daily. 90 tablet 3  . ergocalciferol (VITAMIN D2) 50000 units capsule Take 50,000 Units by mouth once a week.     No facility-administered medications prior to visit.     Review of Systems;  Patient denies headache, fevers, malaise, unintentional weight loss, skin rash, eye pain, sinus congestion and sinus pain, sore throat, dysphagia,  hemoptysis , cough, dyspnea, wheezing, chest pain, palpitations, orthopnea, edema, abdominal pain, nausea, melena, diarrhea, constipation, flank pain, dysuria, hematuria, urinary  Frequency, nocturia, numbness, tingling, seizures,  Focal weakness, Loss of consciousness,  Tremor, insomnia, depression, anxiety, and suicidal ideation.      Objective:  BP 126/76 (BP Location: Left Arm, Patient Position: Sitting, Cuff Size: Large)   Pulse (!) 111   Temp 98.6 F (37 C) (Oral)   Resp 16   Ht 6\' 3"  (1.905 m)   Wt (!) 517 lb (234.5 kg)   SpO2 96%   BMI 64.62 kg/m   BP Readings from Last 3 Encounters:  06/29/18 126/76  04/28/18 110/68  04/21/18 126/90    Wt Readings from Last 3 Encounters:  06/29/18 (!) 517 lb (234.5 kg)  04/28/18 (!) 515 lb (233.6 kg)  04/21/18 (!) 514 lb (233.1 kg)    General appearance: alert, cooperative and appears stated age Ears: normal TM's and external  ear canals both ears Throat: lips, mucosa, and tongue normal; teeth and gums normal Neck: no adenopathy, no carotid bruit, supple, symmetrical, trachea midline and thyroid not enlarged, symmetric, no tenderness/mass/nodules Back: symmetric, no curvature. ROM normal. No CVA tenderness. Lungs: clear to auscultation bilaterally Heart: regular rate and rhythm, S1, S2 normal, no murmur, click, rub or gallop Abdomen: soft, non-tender; bowel sounds normal; no masses,  no organomegaly Pulses: 2+ and symmetric Skin: venous stasis changes to dorsal surface of left foot .  No  rashes or  lesions Lymph nodes: Cervical, supraclavicular, and axillary nodes normal.  Lab Results  Component Value Date   HGBA1C 5.5 03/30/2018   HGBA1C 5.6 08/20/2017   HGBA1C 5.0 06/21/2014    Lab Results  Component Value Date   CREATININE 0.89 03/30/2018   CREATININE 0.86 08/20/2017   CREATININE 0.92 12/16/2016    Lab Results  Component Value Date   WBC 7.1 07/25/2016   HGB 15.6 07/25/2016   HCT 44.2 07/25/2016   PLT 283 07/25/2016   GLUCOSE 109 (H) 03/30/2018   CHOL 137 03/30/2018   TRIG 128.0 03/30/2018   HDL 33.50 (L) 03/30/2018   LDLCALC 78 03/30/2018   ALT 48 03/30/2018   AST 28 03/30/2018   NA 139 03/30/2018   K 3.8 03/30/2018   CL 107 03/30/2018   CREATININE 0.89 03/30/2018   BUN 12 03/30/2018   CO2 23 03/30/2018   TSH 2.03 03/30/2018   HGBA1C 5.5 03/30/2018   MICROALBUR 1.0 03/30/2018    Ct Abdomen Pelvis W Contrast  Result Date: 09/13/2016 CLINICAL DATA:  Patient with history of lymphadenopathy. EXAM: CT ABDOMEN AND PELVIS WITH CONTRAST TECHNIQUE: Multidetector CT imaging of the abdomen and pelvis was performed using the standard protocol following bolus administration of intravenous contrast. CONTRAST:  125 cc Isovue 370 COMPARISON:  CT abdomen pelvis 07/01/2016. FINDINGS: Lower chest: Normal heart size. No consolidative pulmonary opacities. No pleural effusion. Hepatobiliary: Liver is diffusely low in attenuation compatible with steatosis. No focal hepatic lesion is identified. Gallbladder is decompressed. Pancreas: Unremarkable Spleen: Unremarkable Adrenals/Urinary Tract: The adrenal glands are normal. Kidneys enhance symmetrically with contrast. No hydronephrosis. Urinary bladder is unremarkable. Stomach/Bowel: Stomach is within normal limits. Appendix appears normal. No evidence of bowel wall thickening, distention, or inflammatory changes. Vascular/Lymphatic: Normal caliber abdominal aorta. Interval decrease in previously described retroperitoneal adenopathy with a  reference lymph node measuring 11 mm (image 48; series 2), previously 13 mm. Adjacent aortocaval lymph node measures 8 mm (image 46; series 2), previously 14 mm. Peripancreatic lymph node measures 13 mm, previously 16 mm (image 27; series 2). Reproductive: Prostate is unremarkable. Other: No abdominal wall hernia or abnormality. No abdominopelvic ascites. Musculoskeletal: No aggressive or acute appearing osseous lesions. IMPRESSION: Interval decrease in size of previously described prominent retroperitoneal lymph nodes, favored to be secondary to an improving reactive process. Hepatic steatosis. Electronically Signed   By: Annia Belt M.D.   On: 09/13/2016 09:53    Assessment & Plan:   Problem List Items Addressed This Visit    Bilateral lower extremity edema    Worsening,  Likely due to venous stasis with or without diastolic dysfunction,  Lung exam is clear.  Lasix 20 mg daily instead of hctz and repeat the referral to vascular surgery that he declined last year.        Relevant Orders   Ambulatory referral to Vascular Surgery   Essential hypertension, benign   Relevant Medications   furosemide (LASIX) 20 MG tablet   Morbid  obesity (HCC)    He has gained 150 lbs since 2015 and has finally been approved for bariatric surgery and a lap duodenal switch is tentatively planned by Dr Smitty Cords.  Psychology and cardiology evaluations planned , as well as sleep study.       Other Visit Diagnoses    Long term current use of diuretic    -  Primary   Relevant Orders   Basic metabolic panel   Basic metabolic panel   Need for immunization against influenza       Relevant Orders   Flu Vaccine QUAD 36+ mos IM (Completed)     A total of 25 minutes of face to face time was spent with patient more than half of which was spent in counselling about the above mentioned conditions  and coordination of care   I have discontinued Larry Rogers's ergocalciferol and hydrochlorothiazide. I am also having him  start on furosemide. Additionally, I am having him maintain his montelukast, levalbuterol, albuterol, metoprolol succinate, busPIRone, lisinopril, QUDEXY XR, sertraline, and Vitamin D3.  Meds ordered this encounter  Medications  . furosemide (LASIX) 20 MG tablet    Sig: Take 1 tablet (20 mg total) by mouth daily.    Dispense:  30 tablet    Refill:  3    Medications Discontinued During This Encounter  Medication Reason  . ergocalciferol (VITAMIN D2) 50000 units capsule Completed Course  . hydrochlorothiazide (HYDRODIURIL) 12.5 MG tablet     Follow-up: No follow-ups on file.   Sherlene Shams, MD

## 2018-06-30 LAB — BASIC METABOLIC PANEL
BUN: 16 mg/dL (ref 6–23)
CALCIUM: 10.3 mg/dL (ref 8.4–10.5)
CHLORIDE: 104 meq/L (ref 96–112)
CO2: 25 meq/L (ref 19–32)
CREATININE: 0.92 mg/dL (ref 0.40–1.50)
GFR: 109.13 mL/min (ref >60.00)
GLUCOSE: 104 mg/dL — AB (ref 70–99)
Potassium: 4.1 mEq/L (ref 3.5–5.1)
Sodium: 138 mEq/L (ref 135–145)

## 2018-07-16 ENCOUNTER — Ambulatory Visit (INDEPENDENT_AMBULATORY_CARE_PROVIDER_SITE_OTHER): Payer: 59 | Admitting: Internal Medicine

## 2018-07-16 ENCOUNTER — Encounter: Payer: Self-pay | Admitting: Internal Medicine

## 2018-07-16 DIAGNOSIS — R600 Localized edema: Secondary | ICD-10-CM | POA: Diagnosis not present

## 2018-07-16 DIAGNOSIS — R001 Bradycardia, unspecified: Secondary | ICD-10-CM

## 2018-07-16 NOTE — Progress Notes (Signed)
Subjective:  Patient ID: Larry Rogers, male    DOB: Feb 21, 1996  Age: 22 y.o. MRN: [id number]  CC: Diagnoses of Bilateral lower extremity edema and Bradycardia, sinus were pertinent to this visit.  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 He has been noting  increasing fluid retention.   Not eating much due to mouth pain due to wisdom teeth. Has not done any exercise , even walking , since he returned from cruise 3 weeks ago, despite plans to have bariatric surgery in the near future.   He has also become alarmed about his  fluctuating heart rate  (per iphone watch,  Pulse is dropping into the 50's at night while asleep.  Takes metoprolol at night   Outpatient Medications Prior to Visit  Medication Sig Dispense Refill  . albuterol (PROVENTIL HFA;VENTOLIN HFA) 108 (90 Base) MCG/ACT inhaler Inhale 2 puffs every 6 (six) hours as needed into the lungs for wheezing or shortness of breath. 1 Inhaler 0  . busPIRone (BUSPAR) 7.5 MG tablet TAKE 2 TABLETS (15 MG TOTAL) BY MOUTH 3 (THREE) TIMES DAILY. 540 tablet 1  . Cholecalciferol (VITAMIN D3) 5000 units CAPS Take 1 capsule by mouth daily.    . furosemide (LASIX) 20 MG tablet Take 1 tablet (20 mg total) by mouth daily. 30 tablet 3  . levalbuterol (XOPENEX HFA) 45 MCG/ACT inhaler Inhale 2 puffs into the lungs every 6 (six) hours as needed for wheezing. 1 Inhaler 12  . lisinopril (PRINIVIL,ZESTRIL) 10 MG tablet TAKE 1 TABLET (10 MG TOTAL) BY MOUTH DAILY. 90 tablet 1  . metoprolol succinate (TOPROL-XL) 25 MG 24 hr tablet TAKE 1 TABLET (25 MG TOTAL) BY MOUTH DAILY. 90 tablet 1  . montelukast (SINGULAIR) 10 MG tablet Take 1 tablet (10 mg total) by mouth at bedtime. 90 tablet 4  . penicillin v potassium (VEETID) 500 MG tablet     . QUDEXY XR 150 MG CS24 sprinkle capsule TAKE 150 MG BY MOUTH DAILY. 30 capsule 5  . sertraline (ZOLOFT) 100 MG tablet Take 2 tablets (200 mg total) by mouth daily. 180 tablet 1   No  facility-administered medications prior to visit.     Review of Systems;  Patient denies headache, fevers, malaise, unintentional weight loss, skin rash, eye pain, sinus congestion and sinus pain, sore throat, dysphagia,  hemoptysis , cough, dyspnea, wheezing, chest pain, palpitations, orthopnea, edema, abdominal pain, nausea, melena, diarrhea, constipation, flank pain, dysuria, hematuria, urinary  Frequency, nocturia, numbness, tingling, seizures,  Focal weakness, Loss of consciousness,  Tremor, insomnia, depression, anxiety, and suicidal ideation.      Objective:  BP (!) 132/96 (BP Location: Left Arm, Patient Position: Sitting, Cuff Size: Large)   Pulse 81   Temp 98.2 F (36.8 C) (Oral)   Resp 16   Ht 6\' 3"  (1.905 m)   Wt (!) 522 lb 3.2 oz (236.9 kg)   SpO2 97%   BMI 65.27 kg/m   BP Readings from Last 3 Encounters:  07/16/18 (!) 132/96  06/29/18 126/76  04/28/18 110/68    Wt Readings from Last 3 Encounters:  07/16/18 (!) 522 lb 3.2 oz (236.9 kg)  06/29/18 (!) 517 lb (234.5 kg)  04/28/18 (!) 515 lb (233.6 kg)    General appearance: alert, cooperative and appears stated age Ears: normal TM's and external ear canals both ears Throat: lips, mucosa, and tongue normal; teeth and gums normal Neck: no adenopathy, no carotid bruit, supple, symmetrical, trachea midline  and thyroid not enlarged, symmetric, no tenderness/mass/nodules Back: symmetric, no curvature. ROM normal. No CVA tenderness. Lungs: clear to auscultation bilaterally Heart: regular rate and rhythm, S1, S2 normal, no murmur, click, rub or gallop Abdomen: soft, non-tender; bowel sounds normal; no masses,  no organomegaly Pulses: 2+ and symmetric Skin: 1+ pitting edema  Bilaterally without erythema . No rashes or lesions Lymph nodes: Cervical, supraclavicular, and axillary nodes normal.  Lab Results  Component Value Date   HGBA1C 5.5 03/30/2018   HGBA1C 5.6 08/20/2017   HGBA1C 5.0 06/21/2014    Lab Results    Component Value Date   CREATININE 0.92 06/29/2018   CREATININE 0.89 03/30/2018   CREATININE 0.86 08/20/2017    Lab Results  Component Value Date   WBC 7.1 07/25/2016   HGB 15.6 07/25/2016   HCT 44.2 07/25/2016   PLT 283 07/25/2016   GLUCOSE 104 (H) 06/29/2018   CHOL 137 03/30/2018   TRIG 128.0 03/30/2018   HDL 33.50 (L) 03/30/2018   LDLCALC 78 03/30/2018   ALT 48 03/30/2018   AST 28 03/30/2018   NA 138 06/29/2018   K 4.1 06/29/2018   CL 104 06/29/2018   CREATININE 0.92 06/29/2018   BUN 16 06/29/2018   CO2 25 06/29/2018   TSH 2.03 03/30/2018   HGBA1C 5.5 03/30/2018   MICROALBUR 1.0 03/30/2018    Ct Abdomen Pelvis W Contrast  Result Date: 09/13/2016 CLINICAL DATA:  Patient with history of lymphadenopathy. EXAM: CT ABDOMEN AND PELVIS WITH CONTRAST TECHNIQUE: Multidetector CT imaging of the abdomen and pelvis was performed using the standard protocol following bolus administration of intravenous contrast. CONTRAST:  125 cc Isovue 370 COMPARISON:  CT abdomen pelvis 07/01/2016. FINDINGS: Lower chest: Normal heart size. No consolidative pulmonary opacities. No pleural effusion. Hepatobiliary: Liver is diffusely low in attenuation compatible with steatosis. No focal hepatic lesion is identified. Gallbladder is decompressed. Pancreas: Unremarkable Spleen: Unremarkable Adrenals/Urinary Tract: The adrenal glands are normal. Kidneys enhance symmetrically with contrast. No hydronephrosis. Urinary bladder is unremarkable. Stomach/Bowel: Stomach is within normal limits. Appendix appears normal. No evidence of bowel wall thickening, distention, or inflammatory changes. Vascular/Lymphatic: Normal caliber abdominal aorta. Interval decrease in previously described retroperitoneal adenopathy with a reference lymph node measuring 11 mm (image 48; series 2), previously 13 mm. Adjacent aortocaval lymph node measures 8 mm (image 46; series 2), previously 14 mm. Peripancreatic lymph node measures 13 mm,  previously 16 mm (image 27; series 2). Reproductive: Prostate is unremarkable. Other: No abdominal wall hernia or abnormality. No abdominopelvic ascites. Musculoskeletal: No aggressive or acute appearing osseous lesions. IMPRESSION: Interval decrease in size of previously described prominent retroperitoneal lymph nodes, favored to be secondary to an improving reactive process. Hepatic steatosis. Electronically Signed   By: Annia Belt M.D.   On: 09/13/2016 09:53    Assessment & Plan:   Problem List Items Addressed This Visit    Bilateral lower extremity edema    multifactorial  Due to untreated sleep apnea (undiagnosed but highly probable) and venous insufficiency.  He was advised to increase furosemide to 40 mg daily  And strongly advised to start walking daily for at least 15 minutes 2 to 3 times daily.       Bradycardia, sinus    Reassurance given that nocturnal bradycardia is due to use of metoprolol .Marland Kitchen  No changes today since is it only occurring while asleep.          I am having Tandy C. Rafalski maintain his montelukast, levalbuterol, albuterol, metoprolol succinate,  busPIRone, lisinopril, QUDEXY XR, sertraline, Vitamin D3, furosemide, and penicillin v potassium.  No orders of the defined types were placed in this encounter.   There are no discontinued medications.  Follow-up: No follow-ups on file.   Sherlene Shams, MD

## 2018-07-16 NOTE — Patient Instructions (Addendum)
Your fluid retention is in part due to venous insufficiency,  But if you have sleep apnea ,  This can cause fluid retention that WILL respond to higher does of furosemide   Increase your furosemide dose  to 40 mg daily in the morning for one week.    Weigh yourself  Daily   You should see  2  l bs off in 3 days.    Elevate legs when sitting or sleeping ,  This , plus walking,  Will help the venous insufficiency   Walk for 30 minutes daily   Do not worry about heart rate dropping to 50- to 70's at night.  High protein  shakes are better than mashed potatoes  For nutrition

## 2018-07-18 DIAGNOSIS — R001 Bradycardia, unspecified: Secondary | ICD-10-CM | POA: Insufficient documentation

## 2018-07-18 NOTE — Assessment & Plan Note (Signed)
multifactorial  Due to untreated sleep apnea (undiagnosed but highly probable) and venous insufficiency.  He was advised to increase furosemide to 40 mg daily  And strongly advised to start walking daily for at least 15 minutes 2 to 3 times daily.

## 2018-07-18 NOTE — Assessment & Plan Note (Signed)
Reassurance given that nocturnal bradycardia is due to use of metoprolol .Marland Kitchen  No changes today since is it only occurring while asleep.

## 2018-07-20 ENCOUNTER — Telehealth: Payer: Self-pay | Admitting: Internal Medicine

## 2018-07-20 DIAGNOSIS — G4710 Hypersomnia, unspecified: Secondary | ICD-10-CM

## 2018-07-20 DIAGNOSIS — G4700 Insomnia, unspecified: Secondary | ICD-10-CM

## 2018-07-20 DIAGNOSIS — R0683 Snoring: Secondary | ICD-10-CM

## 2018-07-20 NOTE — Addendum Note (Signed)
Addended by: Sherlene Shams on: 07/20/2018 05:20 PM   Modules accepted: Orders

## 2018-07-20 NOTE — Telephone Encounter (Signed)
Copied from CRM [id number]. Topic: General - Other >> Jul 20, 2018 11:39 AM Gaynelle Adu wrote: Reason for CRM: Patient mother is calling to advise that the patient was schedule to have wisdom teeth removed, but the dentist stated the patient will need to schedule a sleep study before they can see him for removing wisdom teeth. The pt mother stated he is in extreme pain. She is hoping to have this completed as soon as possible

## 2018-07-20 NOTE — Telephone Encounter (Signed)
Urgent sleep study has been ordered

## 2018-07-21 NOTE — Telephone Encounter (Signed)
Spoke with pt's mother to let her know that the sleep study has been ordered and if they don't hear anything in the next day or two to please give our office a call so we can check on the referral. Pt's mother gave a verbal understanding.

## 2018-07-22 NOTE — Telephone Encounter (Signed)
Pease advise.

## 2018-07-22 NOTE — Telephone Encounter (Signed)
Mom, Larry Rogers called back because she hasn't heard anything from [facility]. I called them and was advised they just got the referral in their queue today and they soonest available appointment is 08/24/2018. Larry Rogers says the patient can not wait this long. Is there another location to be seen sooner? Please call back to advise.

## 2018-07-23 NOTE — Telephone Encounter (Signed)
Larry Rogers,  See message from Mom.  If the sleep study can be done sooner anywhere?  If not let me know

## 2018-07-28 NOTE — Telephone Encounter (Signed)
I have sent this to [facility] as urgent and let Raul Dellston know that if he has not heard from them in the next few days to let me know. Thanks! Melissa

## 2018-09-19 ENCOUNTER — Other Ambulatory Visit: Payer: Self-pay | Admitting: Internal Medicine

## 2018-09-28 ENCOUNTER — Other Ambulatory Visit: Payer: Self-pay | Admitting: Internal Medicine

## 2018-10-01 ENCOUNTER — Ambulatory Visit: Payer: 59 | Admitting: Internal Medicine

## 2018-10-02 NOTE — Telephone Encounter (Signed)
Done

## 2018-10-23 ENCOUNTER — Other Ambulatory Visit: Payer: Self-pay | Admitting: Internal Medicine

## 2018-12-17 ENCOUNTER — Other Ambulatory Visit: Payer: Self-pay | Admitting: Internal Medicine

## 2019-01-01 ENCOUNTER — Other Ambulatory Visit: Payer: Self-pay | Admitting: Neurology

## 2019-01-15 HISTORY — PX: BILIOPANCREATIC DIVISION W DUODENAL SWITCH: SHX1098

## 2019-01-18 ENCOUNTER — Telehealth: Payer: Self-pay

## 2019-01-18 NOTE — Telephone Encounter (Signed)
Spoke with pt's mother and the pt has been scheduled for tomorrow at 9:20am with Leanora Cover, NP.

## 2019-01-18 NOTE — Telephone Encounter (Signed)
Copied from CRM [id number]. Topic: General - Other >> Jan 18, 2019  1:38 PM Larry Rogers wrote: Reason for CRM: The patient's mother called to schedule an appointment for the patient because he is sick. Tried the practice 3 times and no one answered. Please call the back on his mothers phone [phone]

## 2019-01-19 ENCOUNTER — Ambulatory Visit (INDEPENDENT_AMBULATORY_CARE_PROVIDER_SITE_OTHER): Payer: 59 | Admitting: Family Medicine

## 2019-01-19 ENCOUNTER — Other Ambulatory Visit: Payer: Self-pay

## 2019-01-19 ENCOUNTER — Encounter: Payer: Self-pay | Admitting: Family Medicine

## 2019-01-19 DIAGNOSIS — J029 Acute pharyngitis, unspecified: Secondary | ICD-10-CM

## 2019-01-19 DIAGNOSIS — R6889 Other general symptoms and signs: Secondary | ICD-10-CM | POA: Diagnosis not present

## 2019-01-19 DIAGNOSIS — Z20822 Contact with and (suspected) exposure to covid-19: Secondary | ICD-10-CM

## 2019-01-19 MED ORDER — AMOXICILLIN 500 MG PO CAPS: 500.0000 mg | ORAL_CAPSULE | Freq: Two times a day (BID) | ORAL | 0 refills | Status: DC

## 2019-01-19 NOTE — Progress Notes (Signed)
Patient ID: Larry Rogers, male   DOB: 1996-01-08, 23 y.o.   MRN: [id number]   Virtual Visit via video Note  This visit type was conducted due to national recommendations for restrictions regarding the COVID-19 pandemic (e.g. social distancing).  This format is felt to be most appropriate for this patient at this time.  All issues noted in this document were discussed and addressed.  No physical exam was performed (except for noted visual exam findings with Video Visits).   I connected with Larry Rogers today at  9:20 AM EDT by a video enabled telemedicine application or telephone and verified that I am speaking with the correct person using two identifiers. Location patient: home Location provider: [facility] Persons participating in the virtual visit: patient, provider  I discussed the limitations, risks, security and privacy concerns of performing an evaluation and management service by video and the availability of in person appointments. I also discussed with the patient that there may be a patient responsible charge related to this service. The patient expressed understanding and agreed to proceed.  HPI:  Patient and I connected via video today to discuss sore throat, pain when swallowing and swollen glands in neck for about 5 days.  Patient states his temperature has ranged from 97.4-99.9.  States temperature is usually higher in the evenings and into the night.  Patient has not been out of the home in the past 4 weeks, states his parents do the grocery shopping so he usually is at home with his dog.  Denies any possible exposure to anyone who is been ill, parents have not been sick.  Patient attempted to look in back of throat to see if he gets tell if he had any white spots, believes he was able to see some white spots in the back of his throat but is not 100% sure.  Denies any drooling or inability to manage his oral secretions.  Denies any nausea or vomiting.  Denies diarrhea.   Denies cough, shortness of breath or wheezing.  Denies chest pain.  Denies body aches.  ROS: See pertinent positives and negatives per HPI.  Past Medical History:  Diagnosis Date  . Acne    [place]  . Anxiety   . Anxiety   . Asthma    seasonal allergies, Dx age 23  . Depression   . Fatty liver   . GERD (gastroesophageal reflux disease)   . Headache   . Hypertension 2012   Dr. Kathrene AluArchinald    Past Surgical History:  Procedure Laterality Date  . ADENOIDECTOMY    . ESOPHAGOGASTRODUODENOSCOPY (EGD) WITH PROPOFOL N/A 03/16/2015   Procedure: ESOPHAGOGASTRODUODENOSCOPY (EGD) WITH PROPOFOL;  Surgeon: Louis Meckelobert D Kaplan, MD;  Location: [facility];  Service: Endoscopy;  Laterality: N/A;  . TONSILLECTOMY      Family History  Problem Relation Age of Onset  . Hypertension Father   . Heart disease Paternal Grandfather   . Hypertension Paternal Grandfather   . Aortic aneurysm Paternal Grandfather   . Diabetes Mother   . Diabetes Maternal Uncle        x2  . Colon cancer Neg Hx   . Colon polyps Neg Hx   . Esophageal cancer Neg Hx   . Kidney disease Neg Hx   . Gallbladder disease Neg Hx    Social History   Tobacco Use  . Smoking status: Never Smoker  . Smokeless tobacco: Never Used  Substance Use Topics  . Alcohol use: No    Alcohol/week:  0.0 standard drinks    Current Outpatient Medications:  .  albuterol (PROVENTIL HFA;VENTOLIN HFA) 108 (90 Base) MCG/ACT inhaler, Inhale 2 puffs every 6 (six) hours as needed into the lungs for wheezing or shortness of breath., Disp: 1 Inhaler, Rfl: 0 .  busPIRone (BUSPAR) 7.5 MG tablet, TAKE 2 TABLETS (15 MG TOTAL) BY MOUTH 3 (THREE) TIMES DAILY., Disp: 540 tablet, Rfl: 2 .  Cholecalciferol (VITAMIN D3) 5000 units CAPS, Take 1 capsule by mouth daily., Disp: , Rfl:  .  furosemide (LASIX) 20 MG tablet, TAKE 1 TABLET BY MOUTH EVERY DAY, Disp: 90 tablet, Rfl: 1 .  hydrochlorothiazide (HYDRODIURIL) 12.5 MG tablet, , Disp: , Rfl:  .  levalbuterol  (XOPENEX HFA) 45 MCG/ACT inhaler, Inhale 2 puffs into the lungs every 6 (six) hours as needed for wheezing., Disp: 1 Inhaler, Rfl: 12 .  lisinopril (PRINIVIL,ZESTRIL) 10 MG tablet, TAKE 1 TABLET (10 MG TOTAL) BY MOUTH DAILY., Disp: 90 tablet, Rfl: 1 .  metoprolol succinate (TOPROL-XL) 25 MG 24 hr tablet, TAKE 1 TABLET (25 MG TOTAL) BY MOUTH DAILY., Disp: 90 tablet, Rfl: 1 .  montelukast (SINGULAIR) 10 MG tablet, Take 1 tablet (10 mg total) by mouth at bedtime., Disp: 90 tablet, Rfl: 4 .  penicillin v potassium (VEETID) 500 MG tablet, , Disp: , Rfl:  .  QUDEXY XR 150 MG CS24 sprinkle capsule, TAKE 150 MG BY MOUTH DAILY., Disp: 30 capsule, Rfl: 5 .  sertraline (ZOLOFT) 100 MG tablet, Take 2 tablets (200 mg total) by mouth daily., Disp: 180 tablet, Rfl: 1  EXAM:  GENERAL: alert, oriented, appears well and in no acute distress  HEENT: atraumatic, conjunttiva clear, no obvious abnormalities on inspection of external nose and ears.  I am unable to see back of throat via video feed.  Patient speaking in full sentences, is not drooling does not appear to be clearing throat while we are talking.  NECK: normal movements of the head and neck  LUNGS: on inspection no signs of respiratory distress, breathing rate appears normal, no obvious gross SOB, gasping or wheezing  CV: no obvious cyanosis  MS: moves all visible extremities without noticeable abnormality  PSYCH/NEURO: pleasant and cooperative, no obvious depression or anxiety, speech and thought processing grossly intact  ASSESSMENT AND PLAN:  Discussed the following assessment and plan:  Exudative pharyngitis - Plan: amoxicillin (AMOXIL) 500 MG capsule  Due to pain in throat and patient potentially seeing some white spots in back of throat we will cover with amoxicillin twice daily for 10 days to treat a suspected streptococcal throat infection.  Patient also advised that his symptoms could potentially be COVID-19 infection, he is currently at  day 5 of symptoms.  Advised to self quarantine for the full 14-day length just to be on safe side due to his symptoms.  Patient states he has not left the house in the past 4 weeks and does not plan to anytime soon.  States he will monitor his symptoms for any worsening and let us know if worsening occurs.  Advised to keep with good fluid intake, do diligent handwashing, wipe down commonly surfaces, use Tylenol or Advil as needed for any pain or fever.   I discussed the assessment and treatment plan with the patient. The patient was provided an opportunity to ask questions and all were answered. The patient agreed with the plan and demonstrated an understanding of the instructions.   The patient was advised to call back or seek an in-person evaluation if the  symptoms worsen or if the condition fails to improve as anticipated.   Tracey Harries, FNP

## 2019-02-13 MED ORDER — ENOXAPARIN SODIUM 40 MG/0.4ML ~~LOC~~ SOLN
40.00 | SUBCUTANEOUS | Status: DC
Start: 2019-02-13 — End: 2019-02-13

## 2019-02-13 MED ORDER — PANTOPRAZOLE SODIUM 40 MG IV SOLR
40.00 | INTRAVENOUS | Status: DC
Start: 2019-02-14 — End: 2019-02-13

## 2019-02-13 MED ORDER — GENERIC EXTERNAL MEDICATION
12.50 | Status: DC
Start: ? — End: 2019-02-13

## 2019-02-13 MED ORDER — DIPHENHYDRAMINE HCL 50 MG/ML IJ SOLN
25.00 | INTRAMUSCULAR | Status: DC
Start: ? — End: 2019-02-13

## 2019-02-13 MED ORDER — OXYCODONE HCL 5 MG/5ML PO SOLN
5.00 | ORAL | Status: DC
Start: ? — End: 2019-02-13

## 2019-02-13 MED ORDER — ALBUTEROL SULFATE (2.5 MG/3ML) 0.083% IN NEBU
2.50 | INHALATION_SOLUTION | RESPIRATORY_TRACT | Status: DC
Start: ? — End: 2019-02-13

## 2019-02-13 MED ORDER — ALUMINUM-MAGNESIUM-SIMETHICONE 200-200-20 MG/5ML PO SUSP
20.00 | ORAL | Status: DC
Start: ? — End: 2019-02-13

## 2019-02-13 MED ORDER — KCL IN DEXTROSE-NACL 20-5-0.45 MEQ/L-%-% IV SOLN
75.00 | INTRAVENOUS | Status: DC
Start: ? — End: 2019-02-13

## 2019-02-13 MED ORDER — HYDRALAZINE HCL 20 MG/ML IJ SOLN
10.00 | INTRAMUSCULAR | Status: DC
Start: ? — End: 2019-02-13

## 2019-02-13 MED ORDER — HYDROMORPHONE HCL 1 MG/ML IJ SOLN
1.00 | INTRAMUSCULAR | Status: DC
Start: ? — End: 2019-02-13

## 2019-02-13 MED ORDER — ONDANSETRON HCL 4 MG/2ML IJ SOLN
4.00 | INTRAMUSCULAR | Status: DC
Start: ? — End: 2019-02-13

## 2019-02-17 DIAGNOSIS — G4733 Obstructive sleep apnea (adult) (pediatric): Secondary | ICD-10-CM | POA: Diagnosis not present

## 2019-03-01 DIAGNOSIS — G4733 Obstructive sleep apnea (adult) (pediatric): Secondary | ICD-10-CM | POA: Diagnosis not present

## 2019-03-02 DIAGNOSIS — Z9884 Bariatric surgery status: Secondary | ICD-10-CM | POA: Diagnosis not present

## 2019-03-02 DIAGNOSIS — E860 Dehydration: Secondary | ICD-10-CM | POA: Diagnosis not present

## 2019-03-02 DIAGNOSIS — R112 Nausea with vomiting, unspecified: Secondary | ICD-10-CM | POA: Diagnosis not present

## 2019-03-02 DIAGNOSIS — K219 Gastro-esophageal reflux disease without esophagitis: Secondary | ICD-10-CM | POA: Diagnosis not present

## 2019-03-15 ENCOUNTER — Other Ambulatory Visit: Payer: Self-pay | Admitting: Internal Medicine

## 2019-03-16 ENCOUNTER — Other Ambulatory Visit: Payer: Self-pay | Admitting: Internal Medicine

## 2019-03-19 DIAGNOSIS — G4733 Obstructive sleep apnea (adult) (pediatric): Secondary | ICD-10-CM | POA: Diagnosis not present

## 2019-03-26 DIAGNOSIS — Z713 Dietary counseling and surveillance: Secondary | ICD-10-CM | POA: Diagnosis not present

## 2019-03-26 DIAGNOSIS — Z9884 Bariatric surgery status: Secondary | ICD-10-CM | POA: Diagnosis not present

## 2019-03-31 DIAGNOSIS — G4733 Obstructive sleep apnea (adult) (pediatric): Secondary | ICD-10-CM | POA: Diagnosis not present

## 2019-04-15 ENCOUNTER — Other Ambulatory Visit: Payer: Self-pay | Admitting: Neurology

## 2019-04-15 NOTE — Telephone Encounter (Signed)
Requested Prescriptions   Pending Prescriptions Disp Refills  . QUDEXY XR 150 MG CS24 sprinkle capsule [Pharmacy Med Name: QUDEXY XR 150 MG CAPSULE] 90 capsule 1    Sig: TAKE 1 CAPSULE BY MOUTH EVERY DAY   Rx last filled: 01/01/19 #30 5 reflls  Pt last seen: 04/21/18  Follow up appt scheduled:04/22/19

## 2019-04-19 DIAGNOSIS — G4733 Obstructive sleep apnea (adult) (pediatric): Secondary | ICD-10-CM | POA: Diagnosis not present

## 2019-04-21 NOTE — Progress Notes (Deleted)
NEUROLOGY FOLLOW UP OFFICE NOTE  Larry Rogers [id number]  HISTORY OF PRESENT ILLNESS: Larry Rogers is a 23 year old right-handed male with hypertension, hepatic steatosis, morbid obesity and anxiety who follows up for migrianes.  UPDATE: Intensity:  3/10 Duration: 20 seconds on and off for an hour Frequency:  2 to 3 a month Current abortive medication: sometimes Tylenol, but usually none Antihypertensive medications: none Antidepressant medications: Sertraline 50mg  Anticonvulsant medications: topramate ER 150mg  Vitamins/Herbal/Supplements: none Other therapy: none Other medication: BuSpar  HISTORY: Onset:  2016 Location: Right temporal/parietal region Quality: Sharp/stabbing Initial Intensity: 7/10; August: 4.5/10 Aura: no Prodrome: no Associated symptoms: Right eye tears and twitches, dilated right pupil Mild photophobia. He denies double vision. He has longstanding history of seeing floaters. Initial Duration: Off and on throughout the day; August: 2 hours Initial Frequency: Daily. Goes to sleep and wakes up with it.; August: 2 days per month Triggers;  None Relieving factors:  Laying down and napping Activity: Able to function.   Past abortive medication: Tylenol, Excedrin, Advil, Ibuprofen (ineffective) Past anti-epileptic:  topiramate IR (paresthesias)  He had a CT of the head without contrast performed on 02/12/15 for the headaches, as well as reported left-sided numbness. It was negative.  MRI of brain with and without contrast from 03/23/15 was normal.  PAST MEDICAL HISTORY: Past Medical History:  Diagnosis Date  . Acne    [place]  . Anxiety   . Anxiety   . Asthma    seasonal allergies, Dx age 6  . Depression   . Fatty liver   . GERD (gastroesophageal reflux disease)   . Headache   . Hypertension 2012   Dr. Grace Isaac    MEDICATIONS: Current Outpatient Medications on File Prior to Visit  Medication Sig Dispense  Refill  . albuterol (PROVENTIL HFA;VENTOLIN HFA) 108 (90 Base) MCG/ACT inhaler Inhale 2 puffs every 6 (six) hours as needed into the lungs for wheezing or shortness of breath. 1 Inhaler 0  . amoxicillin (AMOXIL) 500 MG capsule Take 1 capsule (500 mg total) by mouth 2 (two) times daily. 20 capsule 0  . busPIRone (BUSPAR) 7.5 MG tablet TAKE 2 TABLETS (15 MG TOTAL) BY MOUTH 3 (THREE) TIMES DAILY. 540 tablet 2  . Cholecalciferol (VITAMIN D3) 5000 units CAPS Take 1 capsule by mouth daily.    . furosemide (LASIX) 20 MG tablet TAKE 1 TABLET BY MOUTH EVERY DAY 90 tablet 1  . hydrochlorothiazide (HYDRODIURIL) 12.5 MG tablet     . levalbuterol (XOPENEX HFA) 45 MCG/ACT inhaler Inhale 2 puffs into the lungs every 6 (six) hours as needed for wheezing. 1 Inhaler 12  . lisinopril (PRINIVIL,ZESTRIL) 10 MG tablet TAKE 1 TABLET (10 MG TOTAL) BY MOUTH DAILY. 90 tablet 1  . metoprolol succinate (TOPROL-XL) 25 MG 24 hr tablet TAKE 1 TABLET BY MOUTH EVERY DAY 90 tablet 1  . montelukast (SINGULAIR) 10 MG tablet Take 1 tablet (10 mg total) by mouth at bedtime. 90 tablet 4  . penicillin v potassium (VEETID) 500 MG tablet     . QUDEXY XR 150 MG CS24 sprinkle capsule TAKE 1 CAPSULE BY MOUTH EVERY DAY 90 capsule 1  . sertraline (ZOLOFT) 100 MG tablet TAKE 2 TABLETS BY MOUTH EVERY DAY 180 tablet 1   No current facility-administered medications on file prior to visit.     ALLERGIES: No Known Allergies  FAMILY HISTORY: Family History  Problem Relation Age of Onset  . Hypertension Father   . Heart disease Paternal Grandfather   .  Hypertension Paternal Grandfather   . Aortic aneurysm Paternal Grandfather   . Diabetes Mother   . Diabetes Maternal Uncle        x2  . Colon cancer Neg Hx   . Colon polyps Neg Hx   . Esophageal cancer Neg Hx   . Kidney disease Neg Hx   . Gallbladder disease Neg Hx     SOCIAL HISTORY: Social History   Socioeconomic History  . Marital status: Single    Spouse name: Not on file  .  Number of children: 0  . Years of education: Not on file  . Highest education level: Not on file  Occupational History  . Occupation: Consulting civil engineertudent    Comment: [employer]  Social Needs  . Financial resource strain: Not on file  . Food insecurity    Worry: Not on file    Inability: Not on file  . Transportation needs    Medical: Not on file    Non-medical: Not on file  Tobacco Use  . Smoking status: Never Smoker  . Smokeless tobacco: Never Used  Substance and Sexual Activity  . Alcohol use: No    Alcohol/week: 0.0 standard drinks  . Drug use: No  . Sexual activity: Never  Lifestyle  . Physical activity    Days per week: Not on file    Minutes per session: Not on file  . Stress: Not on file  Relationships  . Social Musicianconnections    Talks on phone: Not on file    Gets together: Not on file    Attends religious service: Not on file    Active member of club or organization: Not on file    Attends meetings of clubs or organizations: Not on file    Relationship status: Not on file  . Intimate partner violence    Fear of current or ex partner: Not on file    Emotionally abused: Not on file    Physically abused: Not on file    Forced sexual activity: Not on file  Other Topics Concern  . Not on file  Social History Narrative   [place]. Pets - dog and cat.      School - [employer], rising senior. Plans to major in accountant.      Diet - regular, tries to limit processed sugars      Exercise - started basketball, swimming    REVIEW OF SYSTEMS: Constitutional: No fevers, chills, or sweats, no generalized fatigue, change in appetite Eyes: No visual changes, double vision, eye pain Ear, nose and throat: No hearing loss, ear pain, nasal congestion, sore throat Cardiovascular: No chest pain, palpitations Respiratory:  No shortness of breath at rest or with exertion, wheezes GastrointestinaI: No nausea, vomiting, diarrhea, abdominal pain, fecal incontinence  Genitourinary:  No dysuria, urinary retention or frequency Musculoskeletal:  No neck pain, back pain Integumentary: No rash, pruritus, skin lesions Neurological: as above Psychiatric: No depression, insomnia, anxiety Endocrine: No palpitations, fatigue, diaphoresis, mood swings, change in appetite, change in weight, increased thirst Hematologic/Lymphatic:  No purpura, petechiae. Allergic/Immunologic: no itchy/runny eyes, nasal congestion, recent allergic reactions, rashes  PHYSICAL EXAM: *** General: No acute distress.  Patient appears ***-groomed.   Head:  Normocephalic/atraumatic Eyes:  Fundi examined but not visualized Neck: supple, no paraspinal tenderness, full range of motion Heart:  Regular rate and rhythm Lungs:  Clear to auscultation bilaterally Back: No paraspinal tenderness Neurological Exam: alert and oriented to person, place, and time. Attention span and concentration intact,  recent and remote memory intact, fund of knowledge intact.  Speech fluent and not dysarthric, language intact.  CN II-XII intact. Bulk and tone normal, muscle strength 5/5 throughout.  Sensation to light touch, temperature and vibration intact.  Deep tendon reflexes 2+ throughout, toes downgoing.  Finger to nose and heel to shin testing intact.  Gait normal, Romberg negative.  IMPRESSION: ***  PLAN: ***  Shon MilletAdam , DO  CC: ***

## 2019-04-22 ENCOUNTER — Ambulatory Visit: Payer: 59 | Admitting: Neurology

## 2019-05-14 ENCOUNTER — Ambulatory Visit (INDEPENDENT_AMBULATORY_CARE_PROVIDER_SITE_OTHER): Payer: Self-pay

## 2019-05-14 ENCOUNTER — Other Ambulatory Visit: Payer: Self-pay

## 2019-05-14 DIAGNOSIS — Z23 Encounter for immunization: Secondary | ICD-10-CM

## 2019-05-15 ENCOUNTER — Other Ambulatory Visit: Payer: Self-pay | Admitting: Internal Medicine

## 2019-05-17 ENCOUNTER — Telehealth: Payer: Self-pay | Admitting: Internal Medicine

## 2019-05-17 MED ORDER — LOSARTAN POTASSIUM 50 MG PO TABS: 50.0000 mg | ORAL_TABLET | Freq: Every day | ORAL | 0 refills | Status: DC

## 2019-05-17 NOTE — Telephone Encounter (Signed)
Refill request for lisinopril.

## 2019-05-17 NOTE — Telephone Encounter (Signed)
LMTCB. PEC may speak with pt.  

## 2019-05-17 NOTE — Telephone Encounter (Signed)
Please let Larry Rogers known  that  I am making a decision to change his  ACE Inhibitor (lisinopril) to an ARB  (losartan)  based on increased reports of  angioedema (tongue swelling that can cause airway compromise) .  I also advise him to  take it at night instead of morning,  as recent studies have shown a reduction in incidence of heart attacks and strokes.

## 2019-05-18 NOTE — Telephone Encounter (Signed)
LMTCB

## 2019-05-19 NOTE — Telephone Encounter (Signed)
Reviewed physician's note from 05/17/19 with the patient. Explanation provided and patient stated he understood and will start the Losartan.

## 2019-05-28 DIAGNOSIS — L081 Erythrasma: Secondary | ICD-10-CM | POA: Diagnosis not present

## 2019-06-21 ENCOUNTER — Telehealth: Payer: Self-pay

## 2019-06-21 NOTE — Telephone Encounter (Signed)
Copied from [place] [id number]. Topic: General - Inquiry >> Jun 21, 2019  2:43 PM Rutherford Nail, NT wrote: Reason for CRM: Patient's mother calling and would like to get the patient's vitamin levels checked. States that he recently has weight loss surgery and wants to make sure levels are ok. Scheduled to see Dr Derrel Nip on 08/06/2019. Please advise. CB#: [phone]

## 2019-06-22 NOTE — Telephone Encounter (Signed)
That is supposed to be done by the bariatric clinic .  He should be following up with them bc the labs may not be covered if I run them.

## 2019-06-23 NOTE — Telephone Encounter (Signed)
Spoke with pt's mother to let her know that they should reach out to the bariatric center that performed his surgery and let them draw the labs since he is still following up with them. Mother gave a verbal understanding and stated that she would give them a call.

## 2019-08-06 ENCOUNTER — Other Ambulatory Visit: Payer: Self-pay

## 2019-08-06 ENCOUNTER — Ambulatory Visit (INDEPENDENT_AMBULATORY_CARE_PROVIDER_SITE_OTHER): Payer: BC Managed Care – PPO | Admitting: Internal Medicine

## 2019-08-06 ENCOUNTER — Encounter: Payer: Self-pay | Admitting: Internal Medicine

## 2019-08-06 VITALS — BP 118/72 | HR 97 | Temp 98.8°F | Resp 16 | Ht 75.0 in | Wt >= 6400 oz

## 2019-08-06 DIAGNOSIS — F411 Generalized anxiety disorder: Secondary | ICD-10-CM | POA: Diagnosis not present

## 2019-08-06 DIAGNOSIS — I10 Essential (primary) hypertension: Secondary | ICD-10-CM | POA: Diagnosis not present

## 2019-08-06 DIAGNOSIS — Z9884 Bariatric surgery status: Secondary | ICD-10-CM | POA: Diagnosis not present

## 2019-08-06 MED ORDER — SERTRALINE HCL 100 MG PO TABS: 100.0000 mg | ORAL_TABLET | Freq: Every day | ORAL | 1 refills | Status: DC

## 2019-08-06 MED ORDER — ALBUTEROL SULFATE HFA 108 (90 BASE) MCG/ACT IN AERS: 2.0000 | INHALATION_SPRAY | Freq: Four times a day (QID) | RESPIRATORY_TRACT | 1 refills | Status: DC | PRN

## 2019-08-06 NOTE — Progress Notes (Signed)
Subjective:  Patient ID: Larry Rogers, male    DOB: 1996-08-22  Age: 23 y.o. MRN: [id number]  CC: The primary encounter diagnosis was S/P bariatric surgery. Diagnoses of Essential hypertension, benign, Morbid obesity (HCC), and GAD (generalized anxiety disorder) were also pertinent to this visit.  HPI Larry Rogers presents for FOLLOW UP ON RECENT WEIGHT LOSS SURGERY  This visit occurred during the SARS-CoV-2 public health emergency.  Safety protocols were in place, including screening questions prior to the visit, additional usage of staff PPE, and extensive cleaning of exam room while observing appropriate contact time as indicated for disinfecting solutions.   HAS LOST 120 LBS. SINCE  LAST SEEN OCT 2019,    JOINTS  FEELING BETTER STAMINA HAS IMPROVED CAN WALK FOR 40 MINUTES   LESS ANXIETY .  HAS BEEN ABLE TO LEAVE THE HOUSE DAILY IN SPITE OF COVID PANDEMIC  LAST LABS WERE DONE IN MAY   TAKING VITAMINS PRESCRIBED BY BARIATRIC SURGEON AND 3 CALCIUM CHEWS DAILY   GAD:  TAKING ONLY ZOLOFT 100 MG DAILY    Outpatient Medications Prior to Visit  Medication Sig Dispense Refill  . Cholecalciferol (VITAMIN D3) 5000 units CAPS Take 1 capsule by mouth daily.    Marland Kitchen. losartan (COZAAR) 50 MG tablet Take 1 tablet (50 mg total) by mouth at bedtime. 90 tablet 0  . montelukast (SINGULAIR) 10 MG tablet Take 1 tablet (10 mg total) by mouth at bedtime. 90 tablet 4  . QUDEXY XR 150 MG CS24 sprinkle capsule TAKE 1 CAPSULE BY MOUTH EVERY DAY 90 capsule 1  . albuterol (PROVENTIL HFA;VENTOLIN HFA) 108 (90 Base) MCG/ACT inhaler Inhale 2 puffs every 6 (six) hours as needed into the lungs for wheezing or shortness of breath. 1 Inhaler 0  . levalbuterol (XOPENEX HFA) 45 MCG/ACT inhaler Inhale 2 puffs into the lungs every 6 (six) hours as needed for wheezing. 1 Inhaler 12  . sertraline (ZOLOFT) 100 MG tablet TAKE 2 TABLETS BY MOUTH EVERY DAY 180 tablet 1  . amoxicillin (AMOXIL) 500 MG capsule Take 1 capsule  (500 mg total) by mouth 2 (two) times daily. 20 capsule 0  . busPIRone (BUSPAR) 7.5 MG tablet TAKE 2 TABLETS (15 MG TOTAL) BY MOUTH 3 (THREE) TIMES DAILY. 540 tablet 2  . furosemide (LASIX) 20 MG tablet TAKE 1 TABLET BY MOUTH EVERY DAY 90 tablet 1  . hydrochlorothiazide (HYDRODIURIL) 12.5 MG tablet     . metoprolol succinate (TOPROL-XL) 25 MG 24 hr tablet TAKE 1 TABLET BY MOUTH EVERY DAY 90 tablet 1  . penicillin v potassium (VEETID) 500 MG tablet      No facility-administered medications prior to visit.     Review of Systems;  Patient denies headache, fevers, malaise, unintentional weight loss, skin rash, eye pain, sinus congestion and sinus pain, sore throat, dysphagia,  hemoptysis , cough, dyspnea, wheezing, chest pain, palpitations, orthopnea, edema, abdominal pain, nausea, melena, diarrhea, constipation, flank pain, dysuria, hematuria, urinary  Frequency, nocturia, numbness, tingling, seizures,  Focal weakness, Loss of consciousness,  Tremor, insomnia, depression, anxiety, and suicidal ideation.      Objective:  BP 118/72 (BP Location: Left Arm, Patient Position: Sitting, Cuff Size: Large)   Pulse 97   Temp 98.8 F (37.1 C) (Temporal)   Resp 16   Ht 6\' 3"  (1.905 m)   Wt (!) 408 lb 9.6 oz (185.3 kg)   SpO2 97%   BMI 51.07 kg/m   BP Readings from Last 3 Encounters:  08/06/19 118/72  07/16/18 (!) 132/96  06/29/18 126/76    Wt Readings from Last 3 Encounters:  08/06/19 (!) 408 lb 9.6 oz (185.3 kg)  07/16/18 (!) 522 lb 3.2 oz (236.9 kg)  06/29/18 (!) 517 lb (234.5 kg)    General appearance: alert, cooperative and appears stated age Ears: normal TM's and external ear canals both ears Throat: lips, mucosa, and tongue normal; teeth and gums normal Neck: no adenopathy, no carotid bruit, supple, symmetrical, trachea midline and thyroid not enlarged, symmetric, no tenderness/mass/nodules Back: symmetric, no curvature. ROM normal. No CVA tenderness. Lungs: clear to auscultation  bilaterally Heart: regular rate and rhythm, S1, S2 normal, no murmur, click, rub or gallop Abdomen: soft, non-tender; bowel sounds normal; no masses,  no organomegaly Pulses: 2+ and symmetric Skin: Skin color, texture, turgor normal. No rashes or lesions Lymph nodes: Cervical, supraclavicular, and axillary nodes normal.  Lab Results  Component Value Date   HGBA1C 5.5 03/30/2018   HGBA1C 5.6 08/20/2017   HGBA1C 5.0 06/21/2014    Lab Results  Component Value Date   CREATININE 0.92 06/29/2018   CREATININE 0.89 03/30/2018   CREATININE 0.86 08/20/2017    Lab Results  Component Value Date   WBC 7.1 07/25/2016   HGB 15.6 07/25/2016   HCT 44.2 07/25/2016   PLT 283 07/25/2016   GLUCOSE 104 (H) 06/29/2018   CHOL 137 03/30/2018   TRIG 128.0 03/30/2018   HDL 33.50 (L) 03/30/2018   LDLCALC 78 03/30/2018   ALT 48 03/30/2018   AST 28 03/30/2018   NA 138 06/29/2018   K 4.1 06/29/2018   CL 104 06/29/2018   CREATININE 0.92 06/29/2018   BUN 16 06/29/2018   CO2 25 06/29/2018   TSH 1.02 08/06/2019   HGBA1C 5.5 03/30/2018   MICROALBUR 1.0 03/30/2018    Assessment & Plan:   Problem List Items Addressed This Visit      Unprioritized   Essential hypertension, benign    Improving with dramatic weight loss  With metoprolol and hctz.       Morbid obesity ([place])    improing  He has lost 120 lbs since bariatric surgery      GAD (generalized anxiety disorder)    Improved with weight loss and exercise.  Taking sertraline only.      Relevant Medications   sertraline (ZOLOFT) 100 MG tablet   S/P bariatric surgery - Primary    He has lost 120 lbs since May after having biliopancreatic duodenal swtch      Relevant Orders   Vitamin A   Vitamin K1, Serum   Vitamin B12 (Completed)   Vitamin E   Vitamin B6   Vit D  25 hydroxy (rtn osteoporosis monitoring) (Completed)   Magnesium (Completed)   Vitamin B1   Iron and TIBC   Ferritin (Completed)   Zinc   TSH (Completed)      I  have discontinued Larry Rogers's levalbuterol, penicillin v potassium, busPIRone, hydrochlorothiazide, amoxicillin, furosemide, and metoprolol succinate. I have also changed his sertraline and albuterol. Additionally, I am having him maintain his montelukast, Vitamin D3, Qudexy XR, and losartan.  Meds ordered this encounter  Medications  . sertraline (ZOLOFT) 100 MG tablet    Sig: Take 1 tablet (100 mg total) by mouth daily.    Dispense:  90 tablet    Refill:  1    KEEP ON FILE FOR FUTURE REFILLS  . albuterol (VENTOLIN HFA) 108 (90 Base) MCG/ACT inhaler    Sig: Inhale 2 puffs into  the lungs every 6 (six) hours as needed for wheezing or shortness of breath.    Dispense:  18 g    Refill:  1    KEEP ON FILE FOR FUTURE REFILLS    Medications Discontinued During This Encounter  Medication Reason  . penicillin v potassium (VEETID) 500 MG tablet Patient has not taken in last 30 days  . busPIRone (BUSPAR) 7.5 MG tablet Patient has not taken in last 30 days  . hydrochlorothiazide (HYDRODIURIL) 12.5 MG tablet Patient has not taken in last 30 days  . amoxicillin (AMOXIL) 500 MG capsule Patient has not taken in last 30 days  . furosemide (LASIX) 20 MG tablet Patient has not taken in last 30 days  . metoprolol succinate (TOPROL-XL) 25 MG 24 hr tablet Patient has not taken in last 30 days  . sertraline (ZOLOFT) 100 MG tablet   . levalbuterol (XOPENEX HFA) 45 MCG/ACT inhaler   . albuterol (PROVENTIL HFA;VENTOLIN HFA) 108 (90 Base) MCG/ACT inhaler Reorder    Follow-up: No follow-ups on file.   Sherlene Shams, MD

## 2019-08-06 NOTE — Patient Instructions (Signed)
Congratulations!  I'm so glad you are feeling better !  Keep up the great work!  Eating Plan After Bariatric Surgery, Stage 3 A diet after weight loss surgery (bariatric surgery) should provide plenty of fluids and nutrients while promoting weight loss and healing. Each stage of recovery has a different set of food and drink recommendations. Your health care provider may recommend that you work with a diet and nutrition specialist (dietitian) to make a staged eating plan that is right for you. You may start to transition to a stage 3 diet about 5-6 weeks after surgery. During this stage, you will be allowed to eat foods of various textures. Continue to follow the guidelines below until your health care provider or dietitian approves eating a regular weight-maintenance diet. What are tips for following this plan? Cooking  Use low-fat cooking methods, such as baking, broiling, boiling, or grilling.  Cook using healthy fats, such as olive, sunflower, grapeseed, or canola oil.  Cook and bake using no-calorie sweeteners.  Avoid adding extra salt, sugar, or fat to foods when cooking. Meal planning  Eat 3 meals and 2 snacks, or 5 small meals each day.  Eat at set times. Allow 30-45 minutes for each meal.  Do not skip meals or go a long time without eating (fast). If you are having a hard time eating, talk to your health care provider or dietitian.  Limit food intake to -1 cup per meal. As you heal and advance, you may be able to eat a little more with each meal. Always listen to your body.  Eat foods from all food groups. This includes fruits and vegetables, grains, dairy, and meat and other proteins.  Limit carbohydrate intake to no more than 30 g per meal or 130 g per day. There are about 15-20 g of carbohydrates in 1 piece of bread or a medium piece of fruit. Half of your total grains should be whole grains.  Include a protein-rich food at every meal and snack, and eat the protein food  first. Eat 60-80 g of protein a day when possible. General instructions  Work with your dietitian to slowly add new foods to your diet.  Stay hydrated. Drink at least 48-64 oz of noncarbonated, zero-calorie fluid per day. Water is the best choice.  Limit alcohol intake as directed by your health care provider.  Avoid foods that are high in fat or have added sugar.  Take any vitamin supplements as directed by your health care provider.  Ask your dietitian to help you with meal planning and strategies to continue to lose weight and maintain weight loss. Recommended foods  Grains  Whole wheat bread, crackers, and pasta.  Rice.  Unsweetened hot and cold cereals. Vegetables  All fresh, frozen, or canned vegetables. Fruits  All fresh, frozen, or canned fruit. Meats and other protein foods  Lean meat, poultry, and fish.  Eggs and egg substitutes.  Beans and lentils. Smooth nut butters.  Soy products, such as tempeh and tofu. Dairy  Low or nonfat milk, cheese, and yogurt.  Sugar-free pudding.  Cottage cheese. Beverages  Decaffeinated coffee and tea.  Sugar-free, caffeine-free soft drinks. Fats and oils  Avocado.  Olive, sunflower, grapeseed, or canola oil. Sweets and desserts  Low-fat, sugar-free desserts. Seasoning and other foods  Low-fat, low-sodium condiments. The items listed above may not be a complete list of foods and beverages [you/your child] can eat. Contact a dietitian for more information. Foods to avoid  Hard-to-digest foods, including: ?  Popcorn. ? Nuts and seeds. ? Celery. ? White parts of citrus fruits (pith).  Caffeinated drinks, including: ? Coffee and tea. ? Energy drinks.  Sweets and desserts with more than 25 g of sugar per serving. The items listed above may not be a complete list of foods and beverages [you/your child] should avoid. Contact a dietitian for more information. Summary  Stage 3 diet starts about 5-6 weeks after  surgery. Continue to follow stage 3 guidelines until your health care provider or dietitian approves eating a regular weight-maintenance diet.  Stage 3 diet involves eating a balanced, healthy diet with foods of various textures. This diet helps you continue to lose weight and maintain weight loss.  Eating enough protein and drinking plenty of fluids is important for promoting weight loss and healing after surgery. This information is not intended to replace advice given to you by your health care provider. Make sure you discuss any questions you have with your health care provider. Document Released: 01/06/2017 Document Revised: 12/24/2018 Document Reviewed: 01/06/2017 Elsevier Patient Education  2020 [employer].

## 2019-08-08 DIAGNOSIS — Z9884 Bariatric surgery status: Secondary | ICD-10-CM | POA: Insufficient documentation

## 2019-08-08 NOTE — Assessment & Plan Note (Signed)
Improved with weight loss and exercise.  Taking sertraline only.

## 2019-08-08 NOTE — Assessment & Plan Note (Addendum)
He has lost 120 lbs since May after having biliopancreatic duodenal swtch

## 2019-08-08 NOTE — Assessment & Plan Note (Signed)
improing  He has lost 120 lbs since bariatric surgery

## 2019-08-08 NOTE — Assessment & Plan Note (Signed)
Improving with dramatic weight loss  With metoprolol and hctz.

## 2019-08-09 ENCOUNTER — Other Ambulatory Visit: Payer: Self-pay

## 2019-08-09 MED ORDER — LOSARTAN POTASSIUM 50 MG PO TABS: 50.0000 mg | ORAL_TABLET | Freq: Every day | ORAL | 0 refills | Status: DC

## 2019-08-16 LAB — VITAMIN E
Gamma-Tocopherol (Vit E): <1 mg/L (ref <=4.3)
Vitamin E (Alpha Tocopherol): 6.9 mg/L (ref 5.7–19.9)

## 2019-08-16 LAB — EXTRA SPECIMEN

## 2019-08-16 LAB — VITAMIN B6: Vitamin B6: 20 ng/mL (ref 2.1–21.7)

## 2019-08-16 LAB — VITAMIN A: Vitamin A (Retinoic Acid): 29 ug/dL — ABNORMAL LOW (ref 38–98)

## 2019-08-16 LAB — TSH: TSH: 1.02 mIU/L (ref 0.40–4.50)

## 2019-08-16 LAB — VITAMIN K1, SERUM: Vitamin K: 80 pg/mL — ABNORMAL LOW (ref 130–1500)

## 2019-08-16 LAB — VITAMIN B12: Vitamin B-12: >2000 pg/mL — ABNORMAL HIGH (ref 200–1100)

## 2019-08-16 LAB — VITAMIN D 25 HYDROXY (VIT D DEFICIENCY, FRACTURES): Vit D, 25-Hydroxy: 28 ng/mL — ABNORMAL LOW (ref 30–100)

## 2019-08-16 LAB — ZINC

## 2019-08-17 LAB — FERRITIN: Ferritin: 236 ng/mL (ref 38–380)

## 2019-08-17 LAB — EXTRA SPECIMEN

## 2019-08-17 LAB — MAGNESIUM: Magnesium: 1.9 mg/dL (ref 1.5–2.5)

## 2019-08-17 LAB — IRON, TOTAL/TOTAL IRON BINDING CAP
%SAT: 33 % (calc) (ref 20–48)
Iron: 101 ug/dL (ref 50–195)
TIBC: 302 mcg/dL (calc) (ref 250–425)

## 2019-08-17 LAB — VITAMIN B1: Vitamin B1 (Thiamine): <6 nmol/L — ABNORMAL LOW (ref 8–30)

## 2019-08-19 DIAGNOSIS — G4733 Obstructive sleep apnea (adult) (pediatric): Secondary | ICD-10-CM | POA: Diagnosis not present

## 2019-09-01 DIAGNOSIS — G4733 Obstructive sleep apnea (adult) (pediatric): Secondary | ICD-10-CM | POA: Diagnosis not present

## 2019-09-02 ENCOUNTER — Other Ambulatory Visit: Payer: Self-pay | Admitting: Internal Medicine

## 2019-09-07 ENCOUNTER — Other Ambulatory Visit: Payer: Self-pay | Admitting: Internal Medicine

## 2019-09-19 DIAGNOSIS — G4733 Obstructive sleep apnea (adult) (pediatric): Secondary | ICD-10-CM | POA: Diagnosis not present

## 2019-10-05 DIAGNOSIS — L700 Acne vulgaris: Secondary | ICD-10-CM | POA: Diagnosis not present

## 2019-11-16 ENCOUNTER — Other Ambulatory Visit: Payer: Self-pay | Admitting: Internal Medicine

## 2019-11-30 DIAGNOSIS — G4733 Obstructive sleep apnea (adult) (pediatric): Secondary | ICD-10-CM

## 2019-11-30 DIAGNOSIS — Z9989 Dependence on other enabling machines and devices: Secondary | ICD-10-CM

## 2019-12-08 DIAGNOSIS — G4733 Obstructive sleep apnea (adult) (pediatric): Secondary | ICD-10-CM | POA: Diagnosis not present

## 2020-01-03 DIAGNOSIS — L700 Acne vulgaris: Secondary | ICD-10-CM | POA: Diagnosis not present

## 2020-01-12 DIAGNOSIS — Z9884 Bariatric surgery status: Secondary | ICD-10-CM | POA: Diagnosis not present

## 2020-02-23 ENCOUNTER — Other Ambulatory Visit: Payer: Self-pay | Admitting: Internal Medicine

## 2020-04-20 ENCOUNTER — Ambulatory Visit
Admission: EM | Admit: 2020-04-20 | Discharge: 2020-04-20 | Disposition: A | Discharge status: Home / Self Care | Payer: BC Managed Care – PPO | Attending: Family Medicine | Admitting: Family Medicine

## 2020-04-20 ENCOUNTER — Other Ambulatory Visit: Payer: Self-pay

## 2020-04-20 DIAGNOSIS — Z111 Encounter for screening for respiratory tuberculosis: Secondary | ICD-10-CM | POA: Diagnosis not present

## 2020-04-20 MED ORDER — TUBERCULIN PPD 5 UNIT/0.1ML ID SOLN
5.0000 [IU] | Freq: Once | INTRADERMAL | Status: DC
  Administered 2020-04-20: 5 [IU] via INTRADERMAL

## 2020-04-20 NOTE — ED Triage Notes (Signed)
Pt here for pre-employment PPD placement

## 2020-04-23 ENCOUNTER — Other Ambulatory Visit: Payer: Self-pay

## 2020-04-23 ENCOUNTER — Ambulatory Visit
Admission: EM | Admit: 2020-04-23 | Discharge: 2020-04-23 | Disposition: A | Discharge status: Home / Self Care | Payer: BC Managed Care – PPO | Attending: Family Medicine | Admitting: Family Medicine

## 2020-04-23 DIAGNOSIS — Z111 Encounter for screening for respiratory tuberculosis: Secondary | ICD-10-CM

## 2020-04-23 NOTE — ED Triage Notes (Signed)
Patient here for PPD reading.  Result at 1202 8mm Negative on left forearm.

## 2020-04-28 ENCOUNTER — Other Ambulatory Visit: Payer: Self-pay | Admitting: Internal Medicine

## 2020-05-09 DIAGNOSIS — G4733 Obstructive sleep apnea (adult) (pediatric): Secondary | ICD-10-CM | POA: Diagnosis not present

## 2020-05-15 ENCOUNTER — Other Ambulatory Visit: Payer: Self-pay | Admitting: Internal Medicine

## 2020-11-30 ENCOUNTER — Other Ambulatory Visit: Payer: Self-pay

## 2020-12-04 ENCOUNTER — Other Ambulatory Visit: Payer: Self-pay

## 2020-12-04 MED ORDER — LOSARTAN POTASSIUM 50 MG PO TABS: ORAL_TABLET | ORAL | 1 refills | Status: DC

## 2020-12-19 ENCOUNTER — Other Ambulatory Visit: Payer: Self-pay | Admitting: Internal Medicine

## 2021-01-02 ENCOUNTER — Emergency Department
Admission: EM | Admit: 2021-01-02 | Discharge: 2021-01-02 | Disposition: A | Discharge status: Home / Self Care | Payer: Managed Care, Other (non HMO) | Attending: Student in an Organized Health Care Education/Training Program | Admitting: Student in an Organized Health Care Education/Training Program

## 2021-01-02 ENCOUNTER — Other Ambulatory Visit: Payer: Self-pay

## 2021-01-02 ENCOUNTER — Emergency Department: Payer: Managed Care, Other (non HMO)

## 2021-01-02 DIAGNOSIS — Z79899 Other long term (current) drug therapy: Secondary | ICD-10-CM | POA: Diagnosis not present

## 2021-01-02 DIAGNOSIS — E876 Hypokalemia: Secondary | ICD-10-CM | POA: Diagnosis not present

## 2021-01-02 DIAGNOSIS — I480 Paroxysmal atrial fibrillation: Secondary | ICD-10-CM | POA: Insufficient documentation

## 2021-01-02 DIAGNOSIS — R002 Palpitations: Secondary | ICD-10-CM | POA: Diagnosis present

## 2021-01-02 DIAGNOSIS — I4891 Unspecified atrial fibrillation: Secondary | ICD-10-CM

## 2021-01-02 DIAGNOSIS — I10 Essential (primary) hypertension: Secondary | ICD-10-CM | POA: Insufficient documentation

## 2021-01-02 DIAGNOSIS — J45909 Unspecified asthma, uncomplicated: Secondary | ICD-10-CM | POA: Diagnosis not present

## 2021-01-02 LAB — CBC
HCT: 46.6 % (ref 39.0–52.0)
Hemoglobin: 16 g/dL (ref 13.0–17.0)
MCH: 30.1 pg (ref 26.0–34.0)
MCHC: 34.3 g/dL (ref 30.0–36.0)
MCV: 87.8 fL (ref 80.0–100.0)
Platelets: 238 10*3/uL (ref 150–400)
RBC: 5.31 MIL/uL (ref 4.22–5.81)
RDW: 13.7 % (ref 11.5–15.5)
WBC: 8.2 10*3/uL (ref 4.0–10.5)
nRBC: 0 % (ref 0.0–0.2)

## 2021-01-02 LAB — BASIC METABOLIC PANEL
Anion gap: 7 (ref 5–15)
BUN: 13 mg/dL (ref 6–20)
CO2: 27 mmol/L (ref 22–32)
Calcium: 8.5 mg/dL — ABNORMAL LOW (ref 8.9–10.3)
Chloride: 108 mmol/L (ref 98–111)
Creatinine, Ser: 0.67 mg/dL (ref 0.61–1.24)
GFR, Estimated: >60 mL/min (ref >60)
Glucose, Bld: 98 mg/dL (ref 70–99)
Potassium: 3.3 mmol/L — ABNORMAL LOW (ref 3.5–5.1)
Sodium: 142 mmol/L (ref 135–145)

## 2021-01-02 LAB — MAGNESIUM: Magnesium: 2.1 mg/dL (ref 1.7–2.4)

## 2021-01-02 LAB — TROPONIN I (HIGH SENSITIVITY)
Troponin I (High Sensitivity): 3 ng/L (ref <18)
Troponin I (High Sensitivity): 5 ng/L (ref <18)

## 2021-01-02 LAB — TSH: TSH: 2.148 u[IU]/mL (ref 0.350–4.500)

## 2021-01-02 MED ORDER — METOPROLOL TARTRATE 50 MG PO TABS: 50.0000 mg | ORAL_TABLET | Freq: Two times a day (BID) | ORAL | 0 refills | Status: DC

## 2021-01-02 MED ORDER — LACTATED RINGERS IV BOLUS
1000.0000 mL | Freq: Once | INTRAVENOUS | Status: AC
  Administered 2021-01-02: 1000 mL via INTRAVENOUS

## 2021-01-02 MED ORDER — METOPROLOL TARTRATE 25 MG PO TABS
25.0000 mg | ORAL_TABLET | Freq: Once | ORAL | Status: AC
  Administered 2021-01-02: 25 mg via ORAL
  Filled 2021-01-02: qty 1

## 2021-01-02 MED ORDER — METOPROLOL TARTRATE 5 MG/5ML IV SOLN
5.0000 mg | Freq: Once | INTRAVENOUS | Status: AC
  Administered 2021-01-02: 5 mg via INTRAVENOUS
  Filled 2021-01-02: qty 5

## 2021-01-02 MED ORDER — APIXABAN 5 MG PO TABS: 5.0000 mg | ORAL_TABLET | Freq: Two times a day (BID) | ORAL | 0 refills | Status: DC

## 2021-01-02 MED ORDER — POTASSIUM CHLORIDE CRYS ER 20 MEQ PO TBCR
40.0000 meq | EXTENDED_RELEASE_TABLET | Freq: Once | ORAL | Status: AC
  Administered 2021-01-02: 40 meq via ORAL
  Filled 2021-01-02: qty 2

## 2021-01-02 MED ORDER — MAGNESIUM SULFATE 2 GM/50ML IV SOLN
2.0000 g | Freq: Once | INTRAVENOUS | Status: AC
  Administered 2021-01-02: 2 g via INTRAVENOUS
  Filled 2021-01-02: qty 50

## 2021-01-02 NOTE — ED Notes (Signed)
Pt signed esignature.  D/c  inst to pt.  

## 2021-01-02 NOTE — ED Provider Notes (Signed)
[facility] Emergency Department Provider Note    None    (approximate)  I have reviewed the triage vital signs and the nursing notes.   HISTORY  Chief Complaint Tachycardia    HPI Larry Rogers is a 25 y.o. male with below listed past medical history with gastric bypass in 2020 presents to the ER for evaluation of palpitations.  Patient works third shift and states that he felt his heart racing this morning he checked his watch that said that he was likely in A. fib.  States he checks this from time to time and is never had this before.  Heart rate is typically normal.  Checked 2 days ago and it was reportedly normal.  Denies any history of cardiac issues.  No recent stressors.  No caffeine or stimulant use.  Has had normal appetite.  No history of thyroid disorder.  He denies any chest pain.  No shortness of breath.  No fevers.    Past Medical History:  Diagnosis Date  . Acne    [place]  . Anxiety   . Anxiety   . Asthma    seasonal allergies, Dx age 89  . Depression   . Fatty liver   . GERD (gastroesophageal reflux disease)   . Headache   . Hypertension 2012   Dr. Kathrene AluArchinald   Family History  Problem Relation Age of Onset  . Hypertension Father   . Heart disease Paternal Grandfather   . Hypertension Paternal Grandfather   . Aortic aneurysm Paternal Grandfather   . Diabetes Mother   . Diabetes Maternal Uncle        x2  . Colon cancer Neg Hx   . Colon polyps Neg Hx   . Esophageal cancer Neg Hx   . Kidney disease Neg Hx   . Gallbladder disease Neg Hx    Past Surgical History:  Procedure Laterality Date  . ADENOIDECTOMY    . ESOPHAGOGASTRODUODENOSCOPY (EGD) WITH PROPOFOL N/A 03/16/2015   Procedure: ESOPHAGOGASTRODUODENOSCOPY (EGD) WITH PROPOFOL;  Surgeon: Louis Meckelobert D Kaplan, MD;  Location: [facility];  Service: Endoscopy;  Laterality: N/A;  . TONSILLECTOMY     Patient Active Problem List   Diagnosis Date Noted  . S/P bariatric  surgery 08/08/2019  . Bradycardia, sinus 07/18/2018  . Bilateral lower extremity edema 12/17/2016  . Hepatic steatosis 12/17/2016  . 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 Lower abdominal pain 07/01/2016  . Headache 11/03/2015  . Migraine without aura and without status migrainosus, not intractable 08/07/2015  . Snoring 07/04/2015  . Palpitations 06/14/2015  . Thyroid dysfunction 06/14/2015  . GAD (generalized anxiety disorder) 02/14/2015  . Seasonal allergies 12/20/2014  . Insomnia 08/30/2014  . Gastroesophageal reflux disease without esophagitis 08/15/2014  . Essential hypertension, benign 02/25/2013  . Morbid obesity (HCC) 02/25/2013      Prior to Admission medications   Medication Sig Start Date End Date Taking? Authorizing Provider  apixaban (ELIQUIS) 5 MG TABS tablet Take 1 tablet (5 mg total) by mouth 2 (two) times daily. 01/02/21 02/01/21 Yes Willy Eddyobinson, , MD  metoprolol tartrate (LOPRESSOR) 50 MG tablet Take 1 tablet (50 mg total) by mouth 2 (two) times daily. 01/02/21 01/02/22 Yes Willy Eddyobinson, , MD  albuterol (VENTOLIN HFA) 108 (90 Base) MCG/ACT inhaler Inhale 2 puffs into the lungs every 6 (six) hours as needed for wheezing or shortness of breath. 08/06/19   Sherlene Shamsullo, Teresa L, MD  Cholecalciferol (VITAMIN D3) 5000 units CAPS Take 1 capsule by mouth daily.  [provider]  losartan (COZAAR) 50 MG tablet TAKE 1 TABLET BY MOUTH EVERYDAY AT BEDTIME 12/19/20   Sherlene Shams, MD  montelukast (SINGULAIR) 10 MG tablet Take 1 tablet (10 mg total) by mouth at bedtime. 08/15/14   Shelia Media, MD  QUDEXY XR 150 MG CS24 sprinkle capsule TAKE 1 CAPSULE BY MOUTH EVERY DAY 04/15/19   Everlena Cooper, Adam R, DO  sertraline (ZOLOFT) 100 MG tablet TAKE 2 TABLETS BY MOUTH EVERY DAY 04/28/20   Sherlene Shams, MD    Allergies Patient has no known allergies.    Social History Social History   Tobacco Use  . Smoking status: Never Smoker  . Smokeless  tobacco: Never Used  Substance Use Topics  . Alcohol use: No    Alcohol/week: 0.0 standard drinks  . Drug use: No    Review of Systems Patient denies headaches, rhinorrhea, blurry vision, numbness, shortness of breath, chest pain, edema, cough, abdominal pain, nausea, vomiting, diarrhea, dysuria, fevers, rashes or hallucinations unless otherwise stated above in HPI. ____________________________________________   PHYSICAL EXAM:  VITAL SIGNS: Vitals:   01/02/21 1749 01/02/21 1800  BP: 103/60 111/86  Pulse: 93 (!) 109  Resp:  (!) 25  Temp:    SpO2:  99%    Constitutional: Alert and oriented.  Eyes: Conjunctivae are normal.  Head: Atraumatic. Nose: No congestion/rhinnorhea. Mouth/Throat: Mucous membranes are moist.   Neck: No stridor. Painless ROM.  Cardiovascular: irregularly irregular rhythm. Grossly normal heart sounds.  Good peripheral circulation. Respiratory: Normal respiratory effort.  No retractions. Lungs CTAB. Gastrointestinal: Soft and nontender. No distention. No abdominal bruits. No CVA tenderness. Genitourinary: deferred Musculoskeletal: No lower extremity tenderness nor edema.  No joint effusions. Neurologic:  Normal speech and language. No gross focal neurologic deficits are appreciated. No facial droop Skin:  Skin is warm, dry and intact. No rash noted. Psychiatric: Mood and affect are normal. Speech and behavior are normal.  ____________________________________________   LABS (all labs ordered are listed, but only abnormal results are displayed)  Results for orders placed or performed during the hospital encounter of 01/02/21 (from the past 24 hour(s))  Basic metabolic panel     Status: Abnormal   Collection Time: 01/02/21  2:52 PM  Result Value Ref Range   Sodium 142 135 - 145 mmol/L   Potassium 3.3 (L) 3.5 - 5.1 mmol/L   Chloride 108 98 - 111 mmol/L   CO2 27 22 - 32 mmol/L   Glucose, Bld 98 70 - 99 mg/dL   BUN 13 6 - 20 mg/dL   Creatinine, Ser  1.60 0.61 - 1.24 mg/dL   Calcium 8.5 (L) 8.9 - 10.3 mg/dL   GFR, Estimated >73 >71 mL/min   Anion gap 7 5 - 15  CBC     Status: None   Collection Time: 01/02/21  2:52 PM  Result Value Ref Range   WBC 8.2 4.0 - 10.5 K/uL   RBC 5.31 4.22 - 5.81 MIL/uL   Hemoglobin 16.0 13.0 - 17.0 g/dL   HCT 06.2 69.4 - 85.4 %   MCV 87.8 80.0 - 100.0 fL   MCH 30.1 26.0 - 34.0 pg   MCHC 34.3 30.0 - 36.0 g/dL   RDW 62.7 03.5 - 00.9 %   Platelets 238 150 - 400 K/uL   nRBC 0.0 0.0 - 0.2 %  Troponin I (High Sensitivity)     Status: None   Collection Time: 01/02/21  2:52 PM  Result Value Ref Range   Troponin  I (High Sensitivity) 3 <18 ng/L  TSH     Status: None   Collection Time: 01/02/21  2:52 PM  Result Value Ref Range   TSH 2.148 0.350 - 4.500 uIU/mL  Magnesium     Status: None   Collection Time: 01/02/21  2:52 PM  Result Value Ref Range   Magnesium 2.1 1.7 - 2.4 mg/dL  Troponin I (High Sensitivity)     Status: None   Collection Time: 01/02/21  5:15 PM  Result Value Ref Range   Troponin I (High Sensitivity) 5 <18 ng/L   ____________________________________________  EKG My review and personal interpretation at Time: 14:55   Indication: palpitations  Rate: 150  Rhythm: sinus Axis: normal Other: nonspecific st abn, likely rate dependent ____________________________________________  RADIOLOGY  I personally reviewed all radiographic images ordered to evaluate for the above acute complaints and reviewed radiology reports and findings.  These findings were personally discussed with the patient.  Please see medical record for radiology report.  ____________________________________________   PROCEDURES  Procedure(s) performed:  Procedures    Critical Care performed: no ____________________________________________   INITIAL IMPRESSION / ASSESSMENT AND PLAN / ED COURSE  Pertinent labs & imaging results that were available during my care of the patient were reviewed by me and considered  in my medical decision making (see chart for details).   DDX: Dysrhythmia, electrolyte abnormality, hypothyroid, dehydration, sepsis, pneumonia, CHF, ACS  Larry Rogers is a 25 y.o. who presents to the ED with palpitations as described above presenting with evidence of A. fib with RVR.  Clinically he is very well-appearing and in no acute distress.  No clear aggravating factors.  Does have family history.  Does not seem consistent with PE or infectious process.  Doubt ACS.  The patient will be placed on continuous pulse oximetry and telemetry for monitoring.  Laboratory evaluation will be sent to evaluate for the above complaints.     Clinical Course as of 01/02/21 1834  Tue Jan 02, 2021  1552 Heart rate significantly improved after Lopressor.  Still appears to be in A. fib.  We will continue with IV fluids observe and may redose IV Lopressor.  Noted mild hypokalemia.  Will give oral potassium replenishment. [PR]  1553 Troponin I (High Sensitivity): 3 [PR]  1623 Heart rate still much improved still with A. fib feels much more comfortable.  We will continue as needed Lopressor was still waiting on mag and TSH but denies any chest pain or pressure at this time.  Denied any shortness of breath.  Does not seem consistent with PE. [PR]  1809 Patient reassessed.  Feels well.  Heart rates in the low 90s.  Appears to be rate controlled A. fib.  He feels well.  I discussed case in consultation with Dr. Mariah Milling of cardiology.  Does recommend initiating metoprolol tartrate 50 twice daily.  We discussed possibility of cardioversion here in the ER but patient is somewhat hesitant and not 100% confident that he has not have palpitations and symptoms prior to this morning therefore as he is not on anticoagulation I think the safer option would be conservative management with close outpatient follow-up with cardiology.  Patient and mother agreeable to plan. [PR]    Clinical Course User Index [PR] Willy Eddy,  MD    The patient was evaluated in Emergency Department today for the symptoms described in the history of present illness. He/she was evaluated in the context of the global COVID-19 pandemic, which necessitated consideration that the  patient might be at risk for infection with the SARS-CoV-2 virus that causes COVID-19. Institutional protocols and algorithms that pertain to the evaluation of patients at risk for COVID-19 are in a state of rapid change based on information released by regulatory bodies including the CDC and federal and state organizations. These policies and algorithms were followed during the patient's care in the ED.  As part of my medical decision making, I reviewed the following data within the electronic MEDICAL RECORD NUMBER Nursing notes reviewed and incorporated, Labs reviewed, notes from prior ED visits and [place] Controlled Substance Database   ____________________________________________   FINAL CLINICAL IMPRESSION(S) / ED DIAGNOSES  Final diagnoses:  Atrial fibrillation with RVR (HCC)      NEW MEDICATIONS STARTED DURING THIS VISIT:  New Prescriptions   APIXABAN (ELIQUIS) 5 MG TABS TABLET    Take 1 tablet (5 mg total) by mouth 2 (two) times daily.   METOPROLOL TARTRATE (LOPRESSOR) 50 MG TABLET    Take 1 tablet (50 mg total) by mouth 2 (two) times daily.     Note:  This document was prepared using Dragon voice recognition software and may include unintentional dictation errors.    Willy Eddy, MD 01/02/21 [phone]

## 2021-01-02 NOTE — ED Notes (Addendum)
Resumed care from [facility] rn.  Pt alert.  Family with pt.  Dr Roxan Hockey in with pt.  afib on monitor.

## 2021-01-02 NOTE — ED Triage Notes (Signed)
Pt came from home; pt in afib, has had gastric bypass may 2020; no sob or chest pain

## 2021-01-03 ENCOUNTER — Ambulatory Visit: Payer: Managed Care, Other (non HMO) | Admitting: Cardiology

## 2021-01-03 ENCOUNTER — Encounter: Payer: Self-pay | Admitting: Cardiology

## 2021-01-03 VITALS — BP 100/60 | HR 106 | Ht 75.0 in | Wt 352.0 lb

## 2021-01-03 DIAGNOSIS — I480 Paroxysmal atrial fibrillation: Secondary | ICD-10-CM

## 2021-01-03 MED ORDER — METOPROLOL TARTRATE 50 MG PO TABS: 75.0000 mg | ORAL_TABLET | Freq: Two times a day (BID) | ORAL | 3 refills | Status: DC

## 2021-01-03 NOTE — Progress Notes (Signed)
Electrophysiology Office Note:    Date:  01/03/2021   ID:  Larry Rogers, DOB [birth year], MRN [id number]  PCP:  Sherlene Shams, MD  [facility] Cardiologist:  No primary care provider on file.  [facility] Electrophysiologist:  None   Referring MD: Sherlene Shams, MD   Chief Complaint: Atrial fibrillation  History of Present Illness:    Larry Rogers is a 25 y.o. male who presents for an evaluation of atrial fibrillation at the request of Dr. Roxan Hockey. Their medical history includes obesity post gastric bypass in 2020, fatty liver, GERD, hypertension, anxiety.  The patient presented to the [facility] emergency department on January 02, 2021 after his watch reported that he was likely in atrial fibrillation.  In the emergency department he was found to be in atrial fibrillation with rapid ventricular rates.  He was started on metoprolol but not cardioverted.  He is with his mother today in clinic.  He tells me he does have sleep apnea and uses a CPAP.  Does have 2 family members with atrial fibrillation but none that started early in life.  Past Medical History:  Diagnosis Date  . Acne    [place] Skin  . Anxiety   . Anxiety   . Asthma    seasonal allergies, Dx age 49  . Depression   . Fatty liver   . GERD (gastroesophageal reflux disease)   . Headache   . Hypertension 2012   Dr. Kathrene Alu    Past Surgical History:  Procedure Laterality Date  . ADENOIDECTOMY    . ESOPHAGOGASTRODUODENOSCOPY (EGD) WITH PROPOFOL N/A 03/16/2015   Procedure: ESOPHAGOGASTRODUODENOSCOPY (EGD) WITH PROPOFOL;  Surgeon: Louis Meckel, MD;  Location: [facility];  Service: Endoscopy;  Laterality: N/A;  . TONSILLECTOMY      Current Medications: Current Meds  Medication Sig  . albuterol (VENTOLIN HFA) 108 (90 Base) MCG/ACT inhaler Inhale 2 puffs into the lungs every 6 (six) hours as needed for wheezing or shortness of breath.  Marland Kitchen apixaban (ELIQUIS) 5 MG TABS tablet Take 1 tablet (5  mg total) by mouth 2 (two) times daily.  . Cholecalciferol (VITAMIN D3) 5000 units CAPS Take 1 capsule by mouth daily.  Marland Kitchen losartan (COZAAR) 50 MG tablet TAKE 1 TABLET BY MOUTH EVERYDAY AT BEDTIME  . metoprolol tartrate (LOPRESSOR) 50 MG tablet Take 1 tablet (50 mg total) by mouth 2 (two) times daily.  . montelukast (SINGULAIR) 10 MG tablet Take 1 tablet (10 mg total) by mouth at bedtime.  . sertraline (ZOLOFT) 100 MG tablet TAKE 2 TABLETS BY MOUTH EVERY DAY     Allergies:   Patient has no known allergies.   Social History   Socioeconomic History  . Marital status: Single    Spouse name: Not on file  . Number of children: 0  . Years of education: Not on file  . Highest education level: Not on file  Occupational History  . Occupation: Consulting civil engineer    Comment: [employer]  Tobacco Use  . Smoking status: Never Smoker  . Smokeless tobacco: Never Used  Substance and Sexual Activity  . Alcohol use: No    Alcohol/week: 0.0 standard drinks  . Drug use: No  . Sexual activity: Never  Other Topics Concern  . Not on file  Social History Narrative   Lives in [place] family. Pets - dog and cat.      School - [employer], rising senior. Plans to major in accountant.      Diet -  regular, tries to limit processed sugars      Exercise - started basketball, swimming   Social Determinants of Health   Financial Resource Strain: Not on file  Food Insecurity: Not on file  Transportation Needs: Not on file  Physical Activity: Not on file  Stress: Not on file  Social Connections: Not on file     Family History: The patient's family history includes Aortic aneurysm in his paternal grandfather; Diabetes in his maternal uncle and mother; Heart disease in his paternal grandfather; Hypertension in his father and paternal grandfather. There is no history of Colon cancer, Colon polyps, Esophageal cancer, Kidney disease, or Gallbladder disease.  ROS:   Please see the history of present illness.    All  other systems reviewed and are negative.  EKGs/Labs/Other Studies Reviewed:    The following studies were reviewed today:  January 02, 2021 chest x-ray personally reviewed Cardiac silhouette within normal limits.  No lung disease readily visible.  ER records     EKG:  The ekg ordered today demonstrates atrial fibrillation with ventricular rates of 106 bpm  Recent Labs: 01/02/2021: BUN 13; Creatinine, Ser 0.67; Hemoglobin 16.0; Magnesium 2.1; Platelets 238; Potassium 3.3; Sodium 142; TSH 2.148  Recent Lipid Panel    Component Value Date/Time   CHOL 137 03/30/2018 0913   TRIG 128.0 03/30/2018 0913   HDL 33.50 (L) 03/30/2018 0913   CHOLHDL 4 03/30/2018 0913   VLDL 25.6 03/30/2018 0913   LDLCALC 78 03/30/2018 0913    Physical Exam:    VS:  BP 100/60   Pulse (!) 106   Ht 6\' 3"  (1.905 m)   Wt (!) 352 lb (159.7 kg)   SpO2 98%   BMI 44.00 kg/m     Wt Readings from Last 3 Encounters:  01/03/21 (!) 352 lb (159.7 kg)  01/02/21 (!) 340 lb (154.2 kg)  04/20/20 (!) 350 lb (158.8 kg)     GEN:  Well nourished, well developed in no acute distress.  Obese HEENT: Normal NECK: No JVD; No carotid bruits LYMPHATICS: No lymphadenopathy CARDIAC: Irregularly irregular, tachycardic, no murmurs, rubs, gallops RESPIRATORY:  Clear to auscultation without rales, wheezing or rhonchi  ABDOMEN: Soft, non-tender, non-distended MUSCULOSKELETAL:  No edema; No deformity  SKIN: Warm and dry NEUROLOGIC:  Alert and oriented x 3 PSYCHIATRIC:  Normal affect   ASSESSMENT:    1. Paroxysmal atrial fibrillation (HCC)   2. Morbid obesity (HCC)    PLAN:    In order of problems listed above:  1. Paroxysmal atrial fibrillation First diagnosed on January 02, 2021 when he presented to the emergency department.  Unclear duration of this episode.  Diagnosis was prompted when his watch alerted him of possible A. fib.  He was started on metoprolol tartrate 50 mg twice daily and Eliquis 5 mg twice daily.  His  rates are still slightly elevated.  I would like to increase his metoprolol to 75 mg by mouth twice daily.  We will plan to schedule a cardioversion 4 weeks from today.  I will plan to see him back in clinic in 8 weeks.  I will also order an echocardiogram to rule out structural heart disease.  We discussed the possibility of needing an antiarrhythmic drug in the future to help control his rhythms.  He is currently not an ablation candidate given his body weight.  This was discussed in clinic with the patient.  2.  Obesity Post gastric bypass.  Has lost a significant amount of weight since  surgery.    Medication Adjustments/Labs and Tests Ordered: Current medicines are reviewed at length with the patient today.  Concerns regarding medicines are outlined above.  No orders of the defined types were placed in this encounter.  No orders of the defined types were placed in this encounter.    Signed, Rossie Muskrat. Lalla Brothers, MD, [facility], [facility] 01/03/2021 11:40 AM    Electrophysiology [facility]

## 2021-01-03 NOTE — Patient Instructions (Addendum)
Medication Instructions:  Your physician has recommended you make the following change in your medication:  1.  INCREASE your metoprolol tartrate 50 mg-  Take 1.5 tablets (75 mg) by mouth twice a day.  *If you need a refill on your cardiac medications before your next appointment, please call your pharmacy*  Lab Work: None ordered. If you have labs (blood work) drawn today and your tests are completely normal, you will receive your results only by: Marland Kitchen MyChart Message (if you have MyChart) OR . A paper copy in the mail If you have any lab test that is abnormal or we need to change your treatment, we will call you to review the results.  Testing/Procedures:  Your physician has requested that you have an echocardiogram. Echocardiography is a painless test that uses sound waves to create images of your heart. It provides your doctor with information about the size and shape of your heart and how well your heart's chambers and valves are working. This procedure takes approximately one hour. There are no restrictions for this procedure.  Please schedule an ECHO  Your physician has recommended that you have a Cardioversion (DCCV). Electrical Cardioversion uses a jolt of electricity to your heart either through paddles or wired patches attached to your chest. This is a controlled, usually prescheduled, procedure. Defibrillation is done under light anesthesia in the hospital, and you usually go home the day of the procedure. This is done to get your heart back into a normal rhythm. You are not awake for the procedure. Please see the instruction sheet given to you today.   Follow-Up: At [facility], you and your health needs are our priority.  As part of our continuing mission to provide you with exceptional heart care, we have created designated Provider Care Teams.  These Care Teams include your primary Cardiologist (physician) and Advanced Practice Providers (APPs -  Physician Assistants and Nurse  Practitioners) who all work together to provide you with the care you need, when you need it.  Your next appointment:   Your physician wants you to follow-up in: 8 weeks with Dr. Lalla Brothers.     PENDING CARDIOVERSION:  You are scheduled for a Cardioversion on Jan 29, 2021 with Dr. Kirke Corin. Please arrive at the [facility] at 6:30 a.m. on the day of your procedure.  DIET INSTRUCTIONS:  Nothing to eat or drink after midnight except your medications with a              sip of water.       Labs: Jan 26, 2021 at the [facility]  COVID test--Jan 26, 2021  Testing location: Pre-Admit testing office, [address] in the [facility] located on the [facility] campus at [address] [place], [place] [address]. Designated parking spots for [employer] Patients are available in the parking area directly in front of the [employer]. Handicapped parking is also available for those requiring. After your covid test you will quarantine until the day of your procedure.   1) Medications:  YOU MAY TAKE ALL of your medications with a small amount of water.  2) Must have a responsible person to drive you home.  3) Bring a current list of your medications and current insurance cards.    If you have any questions after you get home, please call the office at [phone]

## 2021-01-05 DIAGNOSIS — I480 Paroxysmal atrial fibrillation: Secondary | ICD-10-CM

## 2021-01-08 ENCOUNTER — Ambulatory Visit (INDEPENDENT_AMBULATORY_CARE_PROVIDER_SITE_OTHER): Payer: Managed Care, Other (non HMO) | Admitting: *Deleted

## 2021-01-08 ENCOUNTER — Other Ambulatory Visit: Payer: Self-pay

## 2021-01-08 VITALS — BP 100/60 | HR 61 | Ht 75.0 in | Wt 352.4 lb

## 2021-01-08 DIAGNOSIS — I480 Paroxysmal atrial fibrillation: Secondary | ICD-10-CM | POA: Diagnosis not present

## 2021-01-08 NOTE — Addendum Note (Signed)
Addended by: Sherri Rad C on: 01/08/2021 04:54 PM   Modules accepted: Orders

## 2021-01-08 NOTE — Patient Instructions (Signed)
Medication Instructions:  - Your physician recommends that you continue on your current medications as directed. Please refer to the Current Medication list given to you today.  *If you need a refill on your cardiac medications before your next appointment, please call your pharmacy*   Lab Work: - none ordered  If you have labs (blood work) drawn today and your tests are completely normal, you will receive your results only by: Marland Kitchen MyChart Message (if you have MyChart) OR . A paper copy in the mail If you have any lab test that is abnormal or we need to change your treatment, we will call you to review the results.   Testing/Procedures: - EKG shows Normal Sinus Rhythm!    Follow-Up: At [facility], you and your health needs are our priority.  As part of our continuing mission to provide you with exceptional heart care, we have created designated Provider Care Teams.  These Care Teams include your primary Cardiologist (physician) and Advanced Practice Providers (APPs -  Physician Assistants and Nurse Practitioners) who all work together to provide you with the care you need, when you need it.  We recommend signing up for the patient portal called "MyChart".  Sign up information is provided on this After Visit Summary.  MyChart is used to connect with patients for Virtual Visits (Telemedicine).  Patients are able to view lab/test results, encounter notes, upcoming appointments, etc.  Non-urgent messages can be sent to your provider as well.   To learn more about what you can do with MyChart, go to [email or web address].    Your next appointment:   As scheduled   The format for your next appointment:   In Person  Provider:   Steffanie Dunn, MD   Other Instructions - You will be contacted by Dr. Lovena Neighbours nurse with any further changes to your follow up or plan of care

## 2021-01-08 NOTE — Progress Notes (Signed)
1.) Reason for visit: EKG  2.) Name of MD requesting visit: Lalla Brothers   3.) H&P: The patient was seen in the [facility] ER on 01/02/21 with complaints of tachycardia. He was found to be in Atrial Fibrillation with RVR. He was discharged from the ER, without converting to NSR, with next day follow up with Dr. Lalla Brothers in the [place] office. At his 01/03/21 office visit, he was found to still be in A-fib with a HR of 106 bpm. He had already been started on metoprolol tartrate 50 mg BID & Eliquis 5 mg BID in the ER on 01/02/21. Dr. Lalla Brothers did increase his metoprolol tartrate to 75 mg BID on 01/03/21 with plans for an echo & DCCV in ~ 4 weeks. The patient did send a Mychart message to Dr. Lalla Brothers earlier today stating that his watch advised he was no longer in A-fib. He presented to the office this afternoon for an EKG to confirm his rhythm.   4.) ROS related to problem: The patient advised that he could tell when he went into atrial fibrillation, but states his watch advised he is no longer out of rhythm and he states he is feeling well today. He does have some intermittent/ brief episodes of dizziness/ lightheadedness with positional changes.   5.) Assessment and plan per MD: EKG performed in office today reviewed with Dr. Mariah Milling (DOD). Dr. Mariah Milling confirms the patient is currently in NSR- HR 61 bpm. He was made aware of the patient's intermittent dizziness/ lightheadedness. Per Dr. Mariah Milling, continue current medications, but may need to think about cutting the losartan to 25 mg once daily for SBP's of 100 mmHg. I have advised the patient to continue to monitor his BP/ symptoms at home. He is to notify us if he reverts back to a-fib/ if his dizziness/ lightheadedness worsen. He is aware I will sent this encounter to Dr. Lalla Brothers to review and that he may be called to decrease losartan/ with any changes to his current follow up. I have advised him to make sure he stays hydrated. The patient voices understanding and is  agreeable.

## 2021-01-17 MED ORDER — APIXABAN 5 MG PO TABS: 5.0000 mg | ORAL_TABLET | Freq: Two times a day (BID) | ORAL | 11 refills | Status: DC

## 2021-01-22 ENCOUNTER — Ambulatory Visit: Payer: Managed Care, Other (non HMO) | Admitting: Cardiology

## 2021-01-26 ENCOUNTER — Other Ambulatory Visit: Payer: Managed Care, Other (non HMO)

## 2021-01-29 ENCOUNTER — Ambulatory Visit: Admit: 2021-01-29 | Payer: Managed Care, Other (non HMO) | Admitting: Cardiovascular Disease

## 2021-01-29 SURGERY — CARDIOVERSION
Anesthesia: General

## 2021-02-02 ENCOUNTER — Ambulatory Visit (INDEPENDENT_AMBULATORY_CARE_PROVIDER_SITE_OTHER): Payer: Managed Care, Other (non HMO)

## 2021-02-02 ENCOUNTER — Other Ambulatory Visit: Payer: Self-pay

## 2021-02-02 DIAGNOSIS — I480 Paroxysmal atrial fibrillation: Secondary | ICD-10-CM | POA: Diagnosis not present

## 2021-02-02 LAB — ECHOCARDIOGRAM COMPLETE
AR max vel: 4.33 cm2
AV Area VTI: 4.3 cm2
AV Area mean vel: 4.27 cm2
AV Mean grad: 5 mmHg
AV Peak grad: 10.9 mmHg
Ao pk vel: 1.65 m/s
Area-P 1/2: 4.93 cm2

## 2021-02-02 MED ORDER — PERFLUTREN LIPID MICROSPHERE
1.0000 mL | INTRAVENOUS | Status: AC | PRN
  Administered 2021-02-02: 2 mL via INTRAVENOUS

## 2021-02-21 ENCOUNTER — Ambulatory Visit: Payer: Managed Care, Other (non HMO) | Admitting: Cardiology

## 2021-03-02 ENCOUNTER — Other Ambulatory Visit: Payer: Self-pay | Admitting: *Deleted

## 2021-03-02 MED ORDER — APIXABAN 5 MG PO TABS: 5.0000 mg | ORAL_TABLET | Freq: Two times a day (BID) | ORAL | 0 refills | Status: DC

## 2021-03-26 ENCOUNTER — Other Ambulatory Visit: Payer: Self-pay

## 2021-03-26 ENCOUNTER — Ambulatory Visit
Admission: RE | Admit: 2021-03-26 | Discharge: 2021-03-26 | Disposition: A | Discharge status: Home / Self Care | Payer: Managed Care, Other (non HMO) | Source: Ambulatory Visit | Attending: Emergency Medicine | Admitting: Emergency Medicine

## 2021-03-26 VITALS — BP 127/84 | HR 80 | Temp 98.8°F | Resp 18

## 2021-03-26 DIAGNOSIS — R1032 Left lower quadrant pain: Secondary | ICD-10-CM | POA: Diagnosis not present

## 2021-03-26 DIAGNOSIS — B349 Viral infection, unspecified: Secondary | ICD-10-CM

## 2021-03-26 NOTE — Discharge Instructions (Addendum)
Go to the emergency department if you have acute abdominal pain.    Your COVID test is pending.  You should self quarantine until the test result is back.    Take Tylenol as needed for fever or discomfort.  Rest and keep yourself hydrated.    Follow-up with your primary care provider if your symptoms are not improving.

## 2021-03-26 NOTE — ED Provider Notes (Signed)
[name]    CSN: [id number] Arrival date & time: 03/26/21  0932      History   Chief Complaint Chief Complaint  Patient presents with   Fever   Generalized Body Aches   Diarrhea     HPI Larry Rogers is a 25 y.o. male.  Patient presents with 2-day history of fever, diarrhea, abdominal pain.  T-max 100.6.  He reports 3-4 episodes of diarrhea today.  He denies rash, ear pain, sore throat, cough, shortness of breath, vomiting, or other symptoms.  Treatment at home with Tylenol.  His medical history includes asthma, seasonal allergies, atrial fibrillation, on Eliquis, hypertension, morbid obesity, migraine headaches, thyroid dysfunction, GERD, bariatric surgery in 2020.  The history is provided by the patient and medical records.   Past Medical History:  Diagnosis Date   Acne    [place] Skin   Anxiety    Anxiety    Asthma    seasonal allergies, Dx age 81   Depression    Fatty liver    GERD (gastroesophageal reflux disease)    Headache    Hypertension 2012   Dr. 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   Lower abdominal pain 07/01/2016   Headache 11/03/2015   Migraine without aura and without status migrainosus, not intractable 08/07/2015   Snoring 07/04/2015   Palpitations 06/14/2015   Thyroid dysfunction 06/14/2015   GAD (generalized anxiety disorder) 02/14/2015   Seasonal allergies 12/20/2014   Insomnia 08/30/2014   Gastroesophageal reflux disease without esophagitis 08/15/2014   Essential hypertension, benign 02/25/2013   Morbid obesity (HCC) 02/25/2013    Past Surgical History:  Procedure Laterality Date   ADENOIDECTOMY     ESOPHAGOGASTRODUODENOSCOPY (EGD) WITH PROPOFOL N/A 03/16/2015   Procedure:  ESOPHAGOGASTRODUODENOSCOPY (EGD) WITH PROPOFOL;  Surgeon: Louis Meckel, MD;  Location: [facility] ENDOSCOPY;  Service: Endoscopy;  Laterality: N/A;   TONSILLECTOMY         Home Medications    Prior to Admission medications   Medication Sig Start Date End Date Taking? Authorizing Provider  apixaban (ELIQUIS) 5 MG TABS tablet Take 1 tablet (5 mg total) by mouth 2 (two) times daily. 03/02/21 04/01/21 Yes Lanier Prude, MD  Cholecalciferol (VITAMIN D3) 5000 units CAPS Take 1 capsule by mouth daily.   Yes [provider]  losartan (COZAAR) 50 MG tablet TAKE 1 TABLET BY MOUTH EVERYDAY AT BEDTIME 12/19/20  Yes Sherlene Shams, MD  metoprolol tartrate (LOPRESSOR) 50 MG tablet Take 1.5 tablets (75 mg total) by mouth 2 (two) times daily. 01/03/21 04/03/21 Yes Lanier Prude, MD  montelukast (SINGULAIR) 10 MG tablet Take 1 tablet (10 mg total) by mouth at bedtime. 08/15/14  Yes Shelia Media, MD  sertraline (ZOLOFT) 100 MG tablet TAKE 2 TABLETS BY MOUTH EVERY DAY 04/28/20  Yes Sherlene Shams, MD  albuterol (VENTOLIN HFA) 108 (90 Base) MCG/ACT inhaler Inhale 2 puffs into the lungs every 6 (six) hours as needed for wheezing or shortness of breath. 08/06/19   Sherlene Shams, MD    Family History Family History  Problem Relation Age of Onset   Hypertension Father    Heart disease Paternal Grandfather    Hypertension Paternal Grandfather    Aortic aneurysm Paternal Grandfather    Diabetes Mother  Diabetes Maternal Uncle        x2   Colon cancer Neg Hx    Colon polyps Neg Hx    Esophageal cancer Neg Hx    Kidney disease Neg Hx    Gallbladder disease Neg Hx     Social History Social History   Tobacco Use   Smoking status: Never   Smokeless tobacco: Never  Substance Use Topics   Alcohol use: No    Alcohol/week: 0.0 standard drinks   Drug use: No     Allergies   Patient has no known allergies.   Review of Systems Review of Systems  Constitutional:  Positive for  fever. Negative for chills.  HENT:  Negative for ear pain and sore throat.   Respiratory:  Negative for cough and shortness of breath.   Cardiovascular:  Negative for chest pain and palpitations.  Gastrointestinal:  Positive for abdominal pain and diarrhea. Negative for vomiting.  Genitourinary:  Negative for dysuria and hematuria.  Skin:  Negative for color change and rash.  All other systems reviewed and are negative.   Physical Exam Triage Vital Signs ED Triage Vitals  Enc Vitals Group     BP      Pulse      Resp      Temp      Temp src      SpO2      Weight      Height      Head Circumference      Peak Flow      Pain Score      Pain Loc      Pain Edu?      Excl. in GC?    No data found.  Updated Vital Signs BP 127/84 (BP Location: Left Arm)   Pulse 80   Temp 98.8 F (37.1 C) (Oral)   Resp 18   SpO2 96%   Visual Acuity Right Eye Distance:   Left Eye Distance:   Bilateral Distance:    Right Eye Near:   Left Eye Near:    Bilateral Near:     Physical Exam Vitals and nursing note reviewed.  Constitutional:      General: He is not in acute distress.    Appearance: He is well-developed. He is not ill-appearing.  HENT:     Head: Normocephalic and atraumatic.     Mouth/Throat:     Mouth: Mucous membranes are moist.  Eyes:     Conjunctiva/sclera: Conjunctivae normal.  Cardiovascular:     Rate and Rhythm: Normal rate and regular rhythm.     Heart sounds: Normal heart sounds.  Pulmonary:     Effort: Pulmonary effort is normal. No respiratory distress.     Breath sounds: Normal breath sounds.  Abdominal:     General: Bowel sounds are normal.     Palpations: Abdomen is soft.     Tenderness: There is abdominal tenderness in the left lower quadrant. There is no guarding or rebound.     Comments: Mild tenderness to left lower quadrant.  No rebound or guarding.  Musculoskeletal:     Cervical back: Neck supple.  Skin:    General: Skin is warm and dry.   Neurological:     General: No focal deficit present.     Mental Status: He is alert and oriented to person, place, and time.     Gait: Gait normal.  Psychiatric:        Mood and Affect: Mood normal.  Behavior: Behavior normal.     UC Treatments / Results  Labs (all labs ordered are listed, but only abnormal results are displayed) Labs Reviewed  NOVEL CORONAVIRUS, NAA    EKG   Radiology No results found.  Procedures Procedures (including critical care time)  Medications Ordered in UC Medications - No data to display  Initial Impression / Assessment and Plan / UC Course  I have reviewed the triage vital signs and the nursing notes.  Pertinent labs & imaging results that were available during my care of the patient were reviewed by me and considered in my medical decision making (see chart for details).    Left lower quadrant abdominal pain, viral illness.  Patient is well-appearing, afebrile, vital signs stable.  Patient had duodenal switch bariatric surgery in 2020; Strict ED precautions discussed for his abdominal pain.  COVID pending.  Instructed patient to self quarantine per CDC guidelines.  Discussed symptomatic treatment including Tylenol, rest, hydration.  Instructed patient to follow up with PCP if symptoms are not improving.  Patient agrees to plan of care.   Final Clinical Impressions(s) / UC Diagnoses   Final diagnoses:  Left lower quadrant abdominal pain  Viral illness     Discharge Instructions      Go to the emergency department if you have acute abdominal pain.    Your COVID test is pending.  You should self quarantine until the test result is back.    Take Tylenol as needed for fever or discomfort.  Rest and keep yourself hydrated.    Follow-up with your primary care provider if your symptoms are not improving.         ED Prescriptions   None    PDMP not reviewed this encounter.   Mickie Bail, NP 03/26/21 1010

## 2021-03-26 NOTE — ED Triage Notes (Signed)
Patient c/o diarrhea, generalized body aches, and fever x 2 days.   Patient endorses a temperature of 100.6 F at home.   Patient endorses onset of symptoms began with constipation.   Patient denies having any at home COVID testing.   Patient endorses 3-4 episodes of diarrhea in the past 1 hours.   Patient endorses ABD pain.   Patient denies N/V.   Patient has used Tylenol with some relief of symptoms.

## 2021-03-27 LAB — SARS-COV-2, NAA 2 DAY TAT

## 2021-03-27 LAB — NOVEL CORONAVIRUS, NAA: SARS-CoV-2, NAA: NOT DETECTED

## 2021-03-28 ENCOUNTER — Ambulatory Visit: Payer: Managed Care, Other (non HMO) | Admitting: Cardiology

## 2021-03-28 ENCOUNTER — Encounter: Payer: Self-pay | Admitting: Cardiology

## 2021-03-28 ENCOUNTER — Other Ambulatory Visit: Payer: Self-pay

## 2021-03-28 VITALS — BP 110/62 | HR 66 | Ht 75.0 in | Wt 354.1 lb

## 2021-03-28 DIAGNOSIS — G4733 Obstructive sleep apnea (adult) (pediatric): Secondary | ICD-10-CM | POA: Diagnosis not present

## 2021-03-28 DIAGNOSIS — I480 Paroxysmal atrial fibrillation: Secondary | ICD-10-CM

## 2021-03-28 DIAGNOSIS — I10 Essential (primary) hypertension: Secondary | ICD-10-CM

## 2021-03-28 NOTE — Progress Notes (Signed)
Electrophysiology Office Follow up Visit Note:    Date:  03/28/2021   ID:  Larry Rogers, DOB 04/05/1996, MRN [id number]  PCP:  Sherlene Shams, MD  [facility] Cardiologist:  None  [facility] Electrophysiologist:  Lanier Prude, MD    Interval History:    Larry Rogers is a 25 y.o. male who presents for a follow up visit. I last saw him 01/03/2021 for his paroxysmal AF. At that appointment I increased his metoprolol dose and planned a cardioversion. Before his cardioversion he reverted back to sinus rhythm.  Today he tells me that he feels well. No recurrence of his AF. He continues to work on weight loss.     Past Medical History:  Diagnosis Date   Acne    [place] Skin   Anxiety    Anxiety    Asthma    seasonal allergies, Dx age 3   Depression    Fatty liver    GERD (gastroesophageal reflux disease)    Headache    Hypertension 2012   Dr. Kathrene Alu    Past Surgical History:  Procedure Laterality Date   ADENOIDECTOMY     ESOPHAGOGASTRODUODENOSCOPY (EGD) WITH PROPOFOL N/A 03/16/2015   Procedure: ESOPHAGOGASTRODUODENOSCOPY (EGD) WITH PROPOFOL;  Surgeon: Louis Meckel, MD;  Location: [facility];  Service: Endoscopy;  Laterality: N/A;   TONSILLECTOMY      Current Medications: Current Meds  Medication Sig   albuterol (VENTOLIN HFA) 108 (90 Base) MCG/ACT inhaler Inhale 2 puffs into the lungs every 6 (six) hours as needed for wheezing or shortness of breath.   apixaban (ELIQUIS) 5 MG TABS tablet Take 1 tablet (5 mg total) by mouth 2 (two) times daily.   Cholecalciferol (VITAMIN D3) 5000 units CAPS Take 1 capsule by mouth daily.   losartan (COZAAR) 50 MG tablet TAKE 1 TABLET BY MOUTH EVERYDAY AT BEDTIME   metoprolol tartrate (LOPRESSOR) 50 MG tablet Take 1.5 tablets (75 mg total) by mouth 2 (two) times daily.   montelukast (SINGULAIR) 10 MG tablet Take 1 tablet (10 mg total) by mouth at bedtime.   sertraline (ZOLOFT) 100 MG tablet TAKE 2 TABLETS BY MOUTH  EVERY DAY     Allergies:   Patient has no known allergies.   Social History   Socioeconomic History   Marital status: Single    Spouse name: Not on file   Number of children: 0   Years of education: Not on file   Highest education level: Not on file  Occupational History   Occupation: Student    Comment: [employer]  Tobacco Use   Smoking status: Never   Smokeless tobacco: Never  Substance and Sexual Activity   Alcohol use: No    Alcohol/week: 0.0 standard drinks   Drug use: No   Sexual activity: Never  Other Topics Concern   Not on file  Social History Narrative   Lives in [place] family. Pets - dog and cat.      School - [employer], rising senior. Plans to major in accountant.      Diet - regular, tries to limit processed sugars      Exercise - started basketball, swimming   Social Determinants of Health   Financial Resource Strain: Not on file  Food Insecurity: Not on file  Transportation Needs: Not on file  Physical Activity: Not on file  Stress: Not on file  Social Connections: Not on file     Family History: The patient's family history includes Aortic aneurysm  in his paternal grandfather; Diabetes in his maternal uncle and mother; Heart disease in his paternal grandfather; Hypertension in his father and paternal grandfather. There is no history of Colon cancer, Colon polyps, Esophageal cancer, Kidney disease, or Gallbladder disease.  ROS:   Please see the history of present illness.    All other systems reviewed and are negative.  EKGs/Labs/Other Studies Reviewed:    The following studies were reviewed today:  02/02/2021 Echo personally reviewed LV normal, 60% RV normal LA moderately dilated  EKG:  The ekg ordered today demonstrates sinus rhythm  Recent Labs: 01/02/2021: BUN 13; Creatinine, Ser 0.67; Hemoglobin 16.0; Magnesium 2.1; Platelets 238; Potassium 3.3; Sodium 142; TSH 2.148  Recent Lipid Panel    Component Value Date/Time   CHOL 137  03/30/2018 0913   TRIG 128.0 03/30/2018 0913   HDL 33.50 (L) 03/30/2018 0913   CHOLHDL 4 03/30/2018 0913   VLDL 25.6 03/30/2018 0913   LDLCALC 78 03/30/2018 0913    Physical Exam:    VS:  BP 110/62 (BP Location: Left Arm, Patient Position: Sitting, Cuff Size: Large)   Pulse 66   Ht 6\' 3"  (1.905 m)   Wt (!) 354 lb 2 oz (160.6 kg)   SpO2 98%   BMI 44.26 kg/m     Wt Readings from Last 3 Encounters:  03/28/21 (!) 354 lb 2 oz (160.6 kg)  01/08/21 (!) 352 lb 6 oz (159.8 kg)  01/03/21 (!) 352 lb (159.7 kg)     GEN: Well nourished, well developed in no acute distress.  Morbidly obese HEENT: Normal NECK: No JVD; No carotid bruits LYMPHATICS: No lymphadenopathy CARDIAC: RRR, no murmurs, rubs, gallops RESPIRATORY:  Clear to auscultation without rales, wheezing or rhonchi  ABDOMEN: Soft, non-tender, non-distended MUSCULOSKELETAL:  No edema; No deformity  SKIN: Warm and dry NEUROLOGIC:  Alert and oriented x 3 PSYCHIATRIC:  Normal affect   ASSESSMENT:    1. Paroxysmal atrial fibrillation (HCC)   2. Morbid obesity (HCC)   3. OSA (obstructive sleep apnea)   4. Primary hypertension    PLAN:    In order of problems listed above:  1. Paroxysmal atrial fibrillation (HCC) Maintaining sinus rhythm.  Continue metoprolol tartrate 75 mg by mouth twice daily. Okay to discontinue Eliquis given we did not do a synchronized cardioversion and his CHA2DS2-VASc of 1.  2. Morbid obesity (HCC) Working on weight loss.  If he can get his weight below 300 pounds, could consider eventual ablation to help control his arrhythmia.  3. OSA (obstructive sleep apnea) Adherence to CPAP therapy advised.  4.  Hypertension Blood pressures controlled.  Continue losartan.  Follow-up 1 year or sooner as needed.  Okay to follow-up with NP/PA at that time.   Medication Adjustments/Labs and Tests Ordered: Current medicines are reviewed at length with the patient today.  Concerns regarding medicines are  outlined above.  Orders Placed This Encounter  Procedures   EKG 12-Lead   No orders of the defined types were placed in this encounter.    Signed, 01/05/21, MD, [facility] 03/28/2021 6:29 PM    Electrophysiology [facility]

## 2021-03-28 NOTE — Patient Instructions (Addendum)
Medication Instructions:  Your physician recommends that you continue on your current medications as directed. Please refer to the Current Medication list given to you today. *If you need a refill on your cardiac medications before your next appointment, please call your pharmacy*  Lab Work: None ordered. If you have labs (blood work) drawn today and your tests are completely normal, you will receive your results only by: MyChart Message (if you have MyChart) OR A paper copy in the mail If you have any lab test that is abnormal or we need to change your treatment, we will call you to review the results.  Testing/Procedures: None ordered.  Follow-Up: At [facility], you and your health needs are our priority.  As part of our continuing mission to provide you with exceptional heart care, we have created designated Provider Care Teams.  These Care Teams include your primary Cardiologist (physician) and Advanced Practice Providers (APPs -  Physician Assistants and Nurse Practitioners) who all work together to provide you with the care you need, when you need it.  Your next appointment:   Your physician wants you to follow-up in: one year with [facility] PA.   You will receive a reminder letter in the mail two months in advance. If you don't receive a letter, please call our office to schedule the follow-up appointment.

## 2021-04-12 ENCOUNTER — Other Ambulatory Visit: Payer: Self-pay

## 2021-04-12 ENCOUNTER — Ambulatory Visit
Admission: RE | Admit: 2021-04-12 | Discharge: 2021-04-12 | Disposition: A | Discharge status: Home / Self Care | Payer: Managed Care, Other (non HMO) | Source: Ambulatory Visit | Attending: Emergency Medicine | Admitting: Emergency Medicine

## 2021-04-12 VITALS — BP 139/94 | HR 69 | Temp 98.1°F | Resp 20

## 2021-04-12 DIAGNOSIS — H00024 Hordeolum internum left upper eyelid: Secondary | ICD-10-CM

## 2021-04-12 MED ORDER — ERYTHROMYCIN 5 MG/GM OP OINT: TOPICAL_OINTMENT | OPHTHALMIC | 0 refills | Status: DC

## 2021-04-12 NOTE — ED Triage Notes (Signed)
Pt here with left eye swelling and pain x 3 days. Sclera is red and irritated as well.

## 2021-04-12 NOTE — ED Provider Notes (Signed)
Chief Complaint   Chief Complaint  Patient presents with   Eye Problem     Subjective  Larry Rogers is a 25 y.o. male who presents with left eye swelling and pain for the last 3 days.  Patient reports a reddened sclera with irritation.  Patient reports discomfort to the left upper eyelid, but does not report any visual changes or global eye pain.  Patient does not report any foreign body, trauma to the eyes.  He states he does work at [employer] and is uncertain if he came into contact with some bacteria that may have gotten in his eye.  He reports the use of clear eyedrops which has helped somewhat.  He also reports the use of warm compresses.  He is concern for stye versus pinkeye.  History obtained from patient.  Patient's past medical, social, surgical, and medication history were reviewed by me and updated in Epic.    Review of Systems   See HPI.    Objective   Vitals:   04/12/21 1026  BP: (!) 139/94  Pulse: 69  Resp: 20  Temp: 98.1 F (36.7 C)  SpO2: 97%            General:  Alert, cooperative, appears stated age. No acute distress  Eyes:  Right: WNL Left: Swelling and erythema noted to upper eyelid with internal punctum.  Crusting noted to outer canthus of left eye.  Vision: Bilateral: 20/20; R: 20/30; L: 20/30  Fluorescein:   None needed      Assessment & Plan  1. Hordeolum internum left upper eyelid  Meds ordered this encounter  Medications   erythromycin ophthalmic ointment    Sig: Place a 1/2 inch ribbon of ointment into the left lower eyelid once nightly for 7 days.    Dispense:  3.5 g    Refill:  0    Order Specific Question:   Supervising Provider    Answer:   Larry Rogers [[id number]]    25 y.o. male presents with left eye swelling and pain for the last 3 days.  Patient reports a reddened sclera with irritation.  Patient reports discomfort to the left upper eyelid, but does not report any visual changes or global eye pain.  Patient does not report  any foreign body, trauma to the eyes.  He states he does work at [employer] and is uncertain if he came into contact with some bacteria that may have gotten in his eye.  He reports the use of clear eyedrops which has helped somewhat.  He also reports the use of warm compresses.  He is concern for stye versus pinkeye.  Chart review completed.  Given symptoms along with assessment findings, likely hordeolum internum of the left upper eyelid.  Rx'd Romycin ointment to his preferred pharmacy and advised about home treatment and care as outlined in his AVS.  Return as needed.  Stable on discharge.  Patient verbalized understanding and agreed with plan.  Plan:   Discharge Instructions      A stye is an infection in the root of an eyelash. The infection causes a tender red lump on the edge of the eyelid. The infection can spread until the whole eyelid becomes red and inflamed. Styes usually break open, and a tiny amount of pus drains. They usually clear up on their own in about a week, but they sometimes need treatment with antibiotics.  To treat an infection of the eyelid: Do not rub your eyes. Do not squeeze  or try to open a stye or chalazion. To help a stye or chalazion heal faster: Put a warm, moist compress on your eye for 5 to 10 minutes, 3 to 6 times a day. Heat often brings a stye to a point where it drains on its own. Keep in mind that warm compresses will often increase swelling a little at first. Do not use hot water or heat a wet cloth in a microwave oven. The compress may get too hot and can burn the eyelid. Always wash your hands before and after you use a compress or touch your eyes. Do not wear eye makeup or contact lenses until the stye or chalazion heals. Do not share towels, pillows, or washcloths while you have a stye.  Return to clinic or go to the ER if you have pain in the eye, change or loss in vision, worsening redness or swelling.        Larry Greenhouse,  FNP-C 04/12/2021  This note was partially made with the aid of speech-to-text dictation; typographical errors are not intentional.    Larry Greenhouse, FNP 04/12/21 1053

## 2021-04-12 NOTE — Discharge Instructions (Addendum)
A stye is an infection in the root of an eyelash. The infection causes a tender red lump on the edge of the eyelid. The infection can spread until the whole eyelid becomes red and inflamed. Styes usually break open, and a tiny amount of pus drains. They usually clear up on their own in about a week, but they sometimes need treatment with antibiotics.  To treat an infection of the eyelid: Do not rub your eyes. Do not squeeze or try to open a stye or chalazion. To help a stye or chalazion heal faster: Put a warm, moist compress on your eye for 5 to 10 minutes, 3 to 6 times a day. Heat often brings a stye to a point where it drains on its own. Keep in mind that warm compresses will often increase swelling a little at first. Do not use hot water or heat a wet cloth in a microwave oven. The compress may get too hot and can burn the eyelid. Always wash your hands before and after you use a compress or touch your eyes. Do not wear eye makeup or contact lenses until the stye or chalazion heals. Do not share towels, pillows, or washcloths while you have a stye.  Return to clinic or go to the ER if you have pain in the eye, change or loss in vision, worsening redness or swelling.

## 2021-07-02 ENCOUNTER — Other Ambulatory Visit: Payer: Self-pay | Admitting: Internal Medicine

## 2021-09-14 ENCOUNTER — Other Ambulatory Visit: Payer: Self-pay | Admitting: Cardiology

## 2021-09-14 MED ORDER — METOPROLOL TARTRATE 50 MG PO TABS: 75.0000 mg | ORAL_TABLET | Freq: Two times a day (BID) | ORAL | 3 refills | Status: DC

## 2021-10-15 ENCOUNTER — Encounter: Payer: Managed Care, Other (non HMO) | Admitting: Internal Medicine

## 2021-11-05 ENCOUNTER — Encounter: Payer: Self-pay | Admitting: Internal Medicine

## 2021-11-26 ENCOUNTER — Other Ambulatory Visit: Payer: Self-pay

## 2021-11-26 ENCOUNTER — Encounter: Payer: Self-pay | Admitting: Internal Medicine

## 2021-11-26 ENCOUNTER — Ambulatory Visit: Payer: Managed Care, Other (non HMO) | Admitting: Internal Medicine

## 2021-11-26 VITALS — BP 122/76 | HR 68 | Temp 97.9°F | Ht 75.0 in | Wt 380.6 lb

## 2021-11-26 DIAGNOSIS — E785 Hyperlipidemia, unspecified: Secondary | ICD-10-CM | POA: Diagnosis not present

## 2021-11-26 DIAGNOSIS — J4520 Mild intermittent asthma, uncomplicated: Secondary | ICD-10-CM

## 2021-11-26 DIAGNOSIS — E079 Disorder of thyroid, unspecified: Secondary | ICD-10-CM | POA: Diagnosis not present

## 2021-11-26 DIAGNOSIS — I10 Essential (primary) hypertension: Secondary | ICD-10-CM

## 2021-11-26 DIAGNOSIS — Z8679 Personal history of other diseases of the circulatory system: Secondary | ICD-10-CM

## 2021-11-26 DIAGNOSIS — F411 Generalized anxiety disorder: Secondary | ICD-10-CM

## 2021-11-26 DIAGNOSIS — R7301 Impaired fasting glucose: Secondary | ICD-10-CM | POA: Diagnosis not present

## 2021-11-26 DIAGNOSIS — R5383 Other fatigue: Secondary | ICD-10-CM

## 2021-11-26 DIAGNOSIS — E559 Vitamin D deficiency, unspecified: Secondary | ICD-10-CM

## 2021-11-26 DIAGNOSIS — Z9884 Bariatric surgery status: Secondary | ICD-10-CM

## 2021-11-26 MED ORDER — LOSARTAN POTASSIUM 50 MG PO TABS: ORAL_TABLET | ORAL | 1 refills | Status: DC

## 2021-11-26 MED ORDER — SERTRALINE HCL 100 MG PO TABS: 200.0000 mg | ORAL_TABLET | Freq: Every day | ORAL | 2 refills | Status: DC

## 2021-11-26 MED ORDER — MONTELUKAST SODIUM 10 MG PO TABS: 10.0000 mg | ORAL_TABLET | Freq: Every day | ORAL | 4 refills | Status: DC

## 2021-11-26 MED ORDER — METOPROLOL TARTRATE 50 MG PO TABS: 75.0000 mg | ORAL_TABLET | Freq: Two times a day (BID) | ORAL | 3 refills | Status: DC

## 2021-11-26 NOTE — Addendum Note (Signed)
Addended by: Sherlene Shams on: 11/26/2021 09:47 AM ? ? Modules accepted: Orders ? ?

## 2021-11-26 NOTE — Addendum Note (Signed)
Addended by: Warden Fillers on: 11/26/2021 10:02 AM ? ? Modules accepted: Orders ? ?

## 2021-11-26 NOTE — Patient Instructions (Signed)
Good to see you!  I have refilled everything  your are taking  ? ?Remember: ? ?10,000 steps is not going to help you keep the weight off .  30 minutes of cardio DAILY will. ? ?Overeaters anonymous has meetings in [place].   ?

## 2021-11-26 NOTE — Assessment & Plan Note (Addendum)
Reached a nadir of 340 lb April 2022 (net 180 lb loss). Weights and current lifestyle reviewed.  He has had a . regain of 30 lbs / discussed  overeaters anonymous and need for daily exercise.  ?

## 2021-11-26 NOTE — Assessment & Plan Note (Addendum)
Improved with weight loss and exercise.  Taking sertraline 200 mg daily  .  No changes today .  He now has a full time job for the past 2 years. Has a girlfriend and moving out of the house.  ?

## 2021-11-26 NOTE — Assessment & Plan Note (Signed)
Treated at [facility] April 2022  With lopressor.  No changes today  He is In sinus rhythm ?

## 2021-11-26 NOTE — Assessment & Plan Note (Signed)
Well controlled on current regimen. Renal function stable, no changes today. 

## 2021-11-26 NOTE — Progress Notes (Signed)
? ?Subjective:  ?Patient ID: Larry Rogers, male    DOB: 10/31/1995  Age: 26 y.o. MRN: [id number] ? ?CC: The primary encounter diagnosis was Essential hypertension, benign. Diagnoses of Thyroid dysfunction, Hyperlipidemia, unspecified hyperlipidemia type, Impaired fasting glucose, Other fatigue, S/P bariatric surgery, GAD (generalized anxiety disorder), Vitamin D deficiency, Asthma, chronic, mild intermittent, uncomplicated, and History of atrial fibrillation were also pertinent to this visit. ? ? ?This visit occurred during the SARS-CoV-2 public health emergency.  Safety protocols were in place, including screening questions prior to the visit, additional usage of staff PPE, and extensive cleaning of exam room while observing appropriate contact time as indicated for disinfecting solutions.   ? ?HPI ?Truth C Heavin presents for  follow up on morbid obesity with hypertension and GAD ?Chief Complaint  ?Patient presents with  ? Follow-up  ? ?1) Morbid obesity:  s/p gastric bypass surgery  (May 2020) . Lost 180 lbs by April 2022,  but  has gained 30  since then ,   averaging 2 meals daily.  Still has abd pain if he overeats.   Aware that he tends to eat rapidly. .  Works  3rd shift. . Walks at work,  advice given  ? ?2) HTN:  Hypertension: patient checks blood pressure twice weekly at home.  Readings have been for the most part <130/80 at rest . Patient is following a reduce salt diet most days and is taking medications as prescribed  ? ? ?3)  GAD: much better since losing the weight.  Full time employee  Working at [employer] for the past 2 years.  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  Girlfriend .  Moving out of parent's home in April ?,  has been saving his money for the past 2 years. [employer] in [place] .  New car.  ? ?4) Episode of atrial fibrillation last April 2022 occurred after work one day.  Treated at [facility] , lopressor 75 mg bid.  ? ?Outpatient Medications Prior to Visit  ?Medication Sig Dispense Refill  ? albuterol  (VENTOLIN HFA) 108 (90 Base) MCG/ACT inhaler Inhale 2 puffs into the lungs every 6 (six) hours as needed for wheezing or shortness of breath. 18 g 1  ? Cholecalciferol (VITAMIN D3) 5000 units CAPS Take 1 capsule by mouth daily.    ? losartan (COZAAR) 50 MG tablet TAKE 1 TABLET BY MOUTH EVERY DAY AT BEDTIME 90 tablet 1  ? metoprolol tartrate (LOPRESSOR) 50 MG tablet Take 1.5 tablets (75 mg total) by mouth 2 (two) times daily. 270 tablet 3  ? montelukast (SINGULAIR) 10 MG tablet Take 1 tablet (10 mg total) by mouth at bedtime. 90 tablet 4  ? sertraline (ZOLOFT) 100 MG tablet TAKE 2 TABLETS BY MOUTH EVERY DAY 180 tablet 2  ? erythromycin ophthalmic ointment Place a 1/2 inch ribbon of ointment into the left lower eyelid once nightly for 7 days. 3.5 g 0  ? ?No facility-administered medications prior to visit.  ? ? ?Review of Systems; ? ?Patient denies headache, fevers, malaise, unintentional weight loss, skin rash, eye pain, sinus congestion and sinus pain, sore throat, dysphagia,  hemoptysis , cough, dyspnea, wheezing, chest pain, palpitations, orthopnea, edema, abdominal pain, nausea, melena, diarrhea, constipation, flank pain, dysuria, hematuria, urinary  Frequency, nocturia, numbness, tingling, seizures,  Focal weakness, Loss of consciousness,  Tremor, insomnia, depression, anxiety, and suicidal ideation.   ? ? ? ?Objective:  ?BP 122/76 (BP Location: Left Arm, Patient Position: Sitting, Cuff Size: Large)   Pulse 68     Temp 97.9 ?F (36.6 ?C) (Oral)   Ht 6' 3" (1.905 m)   Wt (!) 380 lb 9.6 oz (172.6 kg)   SpO2 98%   BMI 47.57 kg/m?  ? ?BP Readings from Last 3 Encounters:  ?11/26/21 122/76  ?04/12/21 (!) 139/94  ?03/28/21 110/62  ? ? ?Wt Readings from Last 3 Encounters:  ?11/26/21 (!) 380 lb 9.6 oz (172.6 kg)  ?03/28/21 (!) 354 lb 2 oz (160.6 kg)  ?01/08/21 (!) 352 lb 6 oz (159.8 kg)  ? ? ?General appearance: alert, cooperative and appears stated age ?Ears: normal TM's and external ear canals both ears ?Throat:  lips, mucosa, and tongue normal; teeth and gums normal ?Neck: no adenopathy, no carotid bruit, supple, symmetrical, trachea midline and thyroid not enlarged, symmetric, no tenderness/mass/nodules ?Back: symmetric, no curvature. ROM normal. No CVA tenderness. ?Lungs: clear to auscultation bilaterally ?Heart: regular rate and rhythm, S1, S2 normal, no murmur, click, rub or gallop ?Abdomen: soft, non-tender; bowel sounds normal; no masses,  no organomegaly ?Pulses: 2+ and symmetric ?Skin: Skin color, texture, turgor normal. No rashes or lesions ?Lymph nodes: Cervical, supraclavicular, and axillary nodes normal. ? ?Lab Results  ?Component Value Date  ? HGBA1C 5.5 03/30/2018  ? HGBA1C 5.6 08/20/2017  ? HGBA1C 5.0 06/21/2014  ? ? ?Lab Results  ?Component Value Date  ? CREATININE 0.67 01/02/2021  ? CREATININE 0.92 06/29/2018  ? CREATININE 0.89 03/30/2018  ? ? ?Lab Results  ?Component Value Date  ? WBC 8.2 01/02/2021  ? HGB 16.0 01/02/2021  ? HCT 46.6 01/02/2021  ? PLT 238 01/02/2021  ? GLUCOSE 98 01/02/2021  ? CHOL 137 03/30/2018  ? TRIG 128.0 03/30/2018  ? HDL 33.50 (L) 03/30/2018  ? LDLCALC 78 03/30/2018  ? ALT 48 03/30/2018  ? AST 28 03/30/2018  ? NA 142 01/02/2021  ? K 3.3 (L) 01/02/2021  ? CL 108 01/02/2021  ? CREATININE 0.67 01/02/2021  ? BUN 13 01/02/2021  ? CO2 27 01/02/2021  ? TSH 2.148 01/02/2021  ? HGBA1C 5.5 03/30/2018  ? MICROALBUR 1.0 03/30/2018  ? ? ?No results found. ? ?Assessment & Plan:  ? ?Problem List Items Addressed This Visit   ? ? Essential hypertension, benign - Primary  ?  Well controlled on current regimen. Renal function stable, no changes today. ?  ?  ? Relevant Medications  ? losartan (COZAAR) 50 MG tablet  ? metoprolol tartrate (LOPRESSOR) 50 MG tablet  ? Other Relevant Orders  ? Comp Met (CMET)  ? Urine Microalbumin w/creat. ratio  ? GAD (generalized anxiety disorder)  ?  Improved with weight loss and exercise.  Taking sertraline 200 mg daily  .  No changes today .  He now has a full time job  for the past 2 years. Has a girlfriend and moving out of the house.  ?  ?  ? Relevant Medications  ? sertraline (ZOLOFT) 100 MG tablet  ? History of atrial fibrillation  ?  Treated at [facility] April 2022  With lopressor.  No changes today  He is In sinus rhythm ?  ?  ? Thyroid dysfunction  ? Relevant Medications  ? metoprolol tartrate (LOPRESSOR) 50 MG tablet  ? Other Relevant Orders  ? TSH  ? S/P bariatric surgery  ?  Reached a nadir of 340 lb April 2022 (net 180 lb loss). Weights and current lifestyle reviewed.  He has had a . regain of 30 lbs / discussed  overeaters anonymous and need for daily exercise.  ?  ?  ?   Relevant Orders  ? Magnesium  ? ?Other Visit Diagnoses   ? ? Hyperlipidemia, unspecified hyperlipidemia type      ? Relevant Medications  ? losartan (COZAAR) 50 MG tablet  ? metoprolol tartrate (LOPRESSOR) 50 MG tablet  ? Other Relevant Orders  ? Lipid Profile  ? Impaired fasting glucose      ? Relevant Orders  ? HgB A1c  ? Other fatigue      ? Relevant Orders  ? CBC with Differential/Platelet  ? Vitamin D deficiency      ? Relevant Orders  ? VITAMIN D 25 Hydroxy (Vit-D Deficiency, Fractures)  ? Asthma, chronic, mild intermittent, uncomplicated      ? Relevant Medications  ? montelukast (SINGULAIR) 10 MG tablet  ? ?  ? ? ?I spent 30 minutes dedicated to the care of this patient on the date of this encounter to include pre-visit review of patient's medical history,  most recent imaging studies, Face-to-face time with the patient , and post visit ordering of testing and therapeutics.   ? ?Follow-up: No follow-ups on file. ? ? ?Crecencio Mc, MD ?

## 2021-11-27 LAB — CBC WITH DIFFERENTIAL/PLATELET
Basophils Absolute: 0 10*3/uL (ref 0.0–0.2)
Basos: 1 %
EOS (ABSOLUTE): 0.2 10*3/uL (ref 0.0–0.4)
Eos: 2 %
Hematocrit: 46.8 % (ref 37.5–51.0)
Hemoglobin: 15.7 g/dL (ref 13.0–17.7)
Immature Grans (Abs): 0 10*3/uL (ref 0.0–0.1)
Immature Granulocytes: 0 %
Lymphocytes Absolute: 2.6 10*3/uL (ref 0.7–3.1)
Lymphs: 35 %
MCH: 30.4 pg (ref 26.6–33.0)
MCHC: 33.5 g/dL (ref 31.5–35.7)
MCV: 91 fL (ref 79–97)
Monocytes Absolute: 0.6 10*3/uL (ref 0.1–0.9)
Monocytes: 9 %
Neutrophils Absolute: 3.9 10*3/uL (ref 1.4–7.0)
Neutrophils: 53 %
Platelets: 256 10*3/uL (ref 150–450)
RBC: 5.16 x10E6/uL (ref 4.14–5.80)
RDW: 12.8 % (ref 11.6–15.4)
WBC: 7.3 10*3/uL (ref 3.4–10.8)

## 2021-11-27 LAB — B12 AND FOLATE PANEL
Folate: 9 ng/mL (ref >3.0)
Vitamin B-12: 572 pg/mL (ref 232–1245)

## 2021-11-27 LAB — COMPREHENSIVE METABOLIC PANEL
ALT: 32 IU/L (ref 0–44)
AST: 27 IU/L (ref 0–40)
Albumin/Globulin Ratio: 1.6 (ref 1.2–2.2)
Albumin: 4.2 g/dL (ref 4.1–5.2)
Alkaline Phosphatase: 102 IU/L (ref 44–121)
BUN/Creatinine Ratio: 14 (ref 9–20)
BUN: 11 mg/dL (ref 6–20)
Bilirubin Total: 0.4 mg/dL (ref 0.0–1.2)
CO2: 20 mmol/L (ref 20–29)
Calcium: 8.8 mg/dL (ref 8.7–10.2)
Chloride: 105 mmol/L (ref 96–106)
Creatinine, Ser: 0.79 mg/dL (ref 0.76–1.27)
Globulin, Total: 2.7 g/dL (ref 1.5–4.5)
Glucose: 83 mg/dL (ref 70–99)
Potassium: 4.7 mmol/L (ref 3.5–5.2)
Sodium: 139 mmol/L (ref 134–144)
Total Protein: 6.9 g/dL (ref 6.0–8.5)
eGFR: 126 mL/min/{1.73_m2} (ref >59)

## 2021-11-27 LAB — HEMOGLOBIN A1C
Est. average glucose Bld gHb Est-mCnc: 91 mg/dL
Hgb A1c MFr Bld: 4.8 % (ref 4.8–5.6)

## 2021-11-27 LAB — LIPID PANEL
Chol/HDL Ratio: 3.3 ratio (ref 0.0–5.0)
Cholesterol, Total: 117 mg/dL (ref 100–199)
HDL: 35 mg/dL — ABNORMAL LOW (ref >39)
LDL Chol Calc (NIH): 68 mg/dL (ref 0–99)
Triglycerides: 63 mg/dL (ref 0–149)
VLDL Cholesterol Cal: 14 mg/dL (ref 5–40)

## 2021-11-27 LAB — TSH: TSH: 1.55 u[IU]/mL (ref 0.450–4.500)

## 2021-11-27 LAB — VITAMIN D 25 HYDROXY (VIT D DEFICIENCY, FRACTURES): Vit D, 25-Hydroxy: 26.5 ng/mL — ABNORMAL LOW (ref 30.0–100.0)

## 2021-11-27 LAB — MICROALBUMIN / CREATININE URINE RATIO
Creatinine, Urine: 72 mg/dL
Microalb/Creat Ratio: <4 mg/g creat (ref 0–29)
Microalbumin, Urine: <3 ug/mL

## 2021-11-27 LAB — MAGNESIUM: Magnesium: 2.1 mg/dL (ref 1.6–2.3)

## 2021-12-13 ENCOUNTER — Ambulatory Visit
Admission: RE | Admit: 2021-12-13 | Discharge: 2021-12-13 | Disposition: A | Discharge status: Home / Self Care | Payer: Managed Care, Other (non HMO) | Source: Ambulatory Visit | Attending: Emergency Medicine | Admitting: Emergency Medicine

## 2021-12-13 VITALS — BP 121/77 | HR 75 | Temp 98.2°F | Resp 18

## 2021-12-13 DIAGNOSIS — H6693 Otitis media, unspecified, bilateral: Secondary | ICD-10-CM

## 2021-12-13 DIAGNOSIS — J0100 Acute maxillary sinusitis, unspecified: Secondary | ICD-10-CM | POA: Diagnosis not present

## 2021-12-13 MED ORDER — AMOXICILLIN 875 MG PO TABS: 875.0000 mg | ORAL_TABLET | Freq: Two times a day (BID) | ORAL | 0 refills | Status: AC

## 2021-12-13 NOTE — ED Triage Notes (Addendum)
Pt here with cough, congestion, right ear pain and chest congestion x 2 days.  ?

## 2021-12-13 NOTE — ED Provider Notes (Signed)
?[facility] ? ? ? ?CSN: [id number] ?Arrival date & time: 12/13/21  [id number] ? ? ?  ? ?History   ?Chief Complaint ?Chief Complaint  ?Patient presents with  ? Nasal Congestion  ?  Sore throat, cough - Entered by patient  ? Cough  ? Chest Congestion  ? Otalgia  ? ? ?HPI ?Larry Rogers is a 26 y.o. male.  Patient presents with sinus congestion, sinus pressure, cough x2 days.  He also reports ear pressure and pain x1 day, worse in the right ear.  He had a sore throat at the onset of his symptoms but this has resolved.  He denies fever, chills, shortness of breath, vomiting, diarrhea, or other symptoms.  His medical history includes asthma, hypertension, atrial fibrillation, morbid obesity, OSA, thyroid dysfunction, bariatric surgery, anxiety, GERD, seasonal allergies, fatty liver. ? ?The history is provided by the patient and medical records.  ? ?Past Medical History:  ?Diagnosis Date  ? Acne   ? [place] Skin  ? Anxiety   ? Anxiety   ? Asthma   ? seasonal allergies, Dx age 89  ? Depression   ? Fatty liver   ? GERD (gastroesophageal reflux disease)   ? Headache   ? Hypertension 2012  ? Dr. Kathrene AluArchinald  ? ? ?Patient Active Problem List  ? Diagnosis Date Noted  ? S/P bariatric surgery 08/08/2019  ? Bilateral lower extremity edema 12/17/2016  ? Hepatic steatosis 12/17/2016  ? Lower abdominal pain 07/01/2016  ? Snoring 07/04/2015  ? History of atrial fibrillation 06/14/2015  ? Thyroid dysfunction 06/14/2015  ? GAD (generalized anxiety disorder) 02/14/2015  ? Seasonal allergies 12/20/2014  ? Gastroesophageal reflux disease without esophagitis 08/15/2014  ? Essential hypertension, benign 02/25/2013  ? Morbid obesity (HCC) 02/25/2013  ? ? ?Past Surgical History:  ?Procedure Laterality Date  ? ADENOIDECTOMY    ? ESOPHAGOGASTRODUODENOSCOPY (EGD) WITH PROPOFOL N/A 03/16/2015  ? Procedure: ESOPHAGOGASTRODUODENOSCOPY (EGD) WITH PROPOFOL;  Surgeon: Louis Meckelobert D Kaplan, MD;  Location: [facility];  Service: Endoscopy;  Laterality:  N/A;  ? TONSILLECTOMY    ? ? ? ? ? ?Home Medications   ? ?Prior to Admission medications   ?Medication Sig Start Date End Date Taking? Authorizing Provider  ?amoxicillin (AMOXIL) 875 MG tablet Take 1 tablet (875 mg total) by mouth 2 (two) times daily for 7 days. 12/13/21 12/20/21 Yes Mickie Bailate,  H, NP  ?albuterol (VENTOLIN HFA) 108 (90 Base) MCG/ACT inhaler Inhale 2 puffs into the lungs every 6 (six) hours as needed for wheezing or shortness of breath. 08/06/19   Sherlene Shamsullo, Teresa L, MD  ?Cholecalciferol (VITAMIN D3) 5000 units CAPS Take 1 capsule by mouth daily.    [provider]  ?losartan (COZAAR) 50 MG tablet TAKE 1 TABLET BY MOUTH EVERY DAY AT BEDTIME 11/26/21   Sherlene Shamsullo, Teresa L, MD  ?metoprolol tartrate (LOPRESSOR) 50 MG tablet Take 1.5 tablets (75 mg total) by mouth 2 (two) times daily. 11/26/21 02/24/22  Sherlene Shamsullo, Teresa L, MD  ?montelukast (SINGULAIR) 10 MG tablet Take 1 tablet (10 mg total) by mouth at bedtime. 11/26/21   Sherlene Shamsullo, Teresa L, MD  ?sertraline (ZOLOFT) 100 MG tablet Take 2 tablets (200 mg total) by mouth daily. 11/26/21   Sherlene Shamsullo, Teresa L, MD  ? ? ?Family History ?Family History  ?Problem Relation Age of Onset  ? Hypertension Father   ? Heart disease Paternal Grandfather   ? Hypertension Paternal Grandfather   ? Aortic aneurysm Paternal Grandfather   ? Diabetes Mother   ? Diabetes  Maternal Uncle   ?     x2  ? Colon cancer Neg Hx   ? Colon polyps Neg Hx   ? Esophageal cancer Neg Hx   ? Kidney disease Neg Hx   ? Gallbladder disease Neg Hx   ? ? ?Social History ?Social History  ? ?Tobacco Use  ? Smoking status: Never  ? Smokeless tobacco: Never  ?Substance Use Topics  ? Alcohol use: No  ?  Alcohol/week: 0.0 standard drinks  ? Drug use: No  ? ? ? ?Allergies   ?Patient has no known allergies. ? ? ?Review of Systems ?Review of Systems  ?Constitutional:  Negative for chills and fever.  ?HENT:  Positive for congestion, ear pain, postnasal drip, rhinorrhea and sinus pressure. Negative for sore throat.    ?Respiratory:  Positive for cough. Negative for shortness of breath.   ?Cardiovascular:  Negative for chest pain and palpitations.  ?Gastrointestinal:  Negative for diarrhea and vomiting.  ?Skin:  Negative for color change and rash.  ?All other systems reviewed and are negative. ? ? ?Physical Exam ?Triage Vital Signs ?ED Triage Vitals [12/13/21 0940]  ?Enc Vitals Group  ?   BP   ?   Pulse   ?   Resp   ?   Temp   ?   Temp src   ?   SpO2   ?   Weight   ?   Height   ?   Head Circumference   ?   Peak Flow   ?   Pain Score 5  ?   Pain Loc   ?   Pain Edu?   ?   Excl. in GC?   ? ?No data found. ? ?Updated Vital Signs ?BP 121/77   Pulse 75   Temp 98.2 ?F (36.8 ?C)   Resp 18   SpO2 96%  ? ?Visual Acuity ?Right Eye Distance:   ?Left Eye Distance:   ?Bilateral Distance:   ? ?Right Eye Near:   ?Left Eye Near:    ?Bilateral Near:    ? ?Physical Exam ?Vitals and nursing note reviewed.  ?Constitutional:   ?   General: He is not in acute distress. ?   Appearance: He is well-developed. He is not ill-appearing.  ?HENT:  ?   Right Ear: Tympanic membrane is erythematous.  ?   Left Ear: Tympanic membrane is erythematous.  ?   Nose: Congestion and rhinorrhea present.  ?   Mouth/Throat:  ?   Mouth: Mucous membranes are moist.  ?   Pharynx: Oropharynx is clear.  ?Cardiovascular:  ?   Rate and Rhythm: Normal rate and regular rhythm.  ?   Heart sounds: Normal heart sounds.  ?Pulmonary:  ?   Effort: Pulmonary effort is normal. No respiratory distress.  ?   Breath sounds: Normal breath sounds.  ?Musculoskeletal:  ?   Cervical back: Neck supple.  ?Skin: ?   General: Skin is warm and dry.  ?Neurological:  ?   Mental Status: He is alert.  ?Psychiatric:     ?   Mood and Affect: Mood normal.     ?   Behavior: Behavior normal.  ? ? ? ?UC Treatments / Results  ?Labs ?(all labs ordered are listed, but only abnormal results are displayed) ?Labs Reviewed - No data to display ? ? ?EKG ? ? ?Radiology ?No results found. ? ?Procedures ?Procedures  (including critical care time) ? ?Medications Ordered in UC ?Medications - No data to display ? ?Initial  Impression / Assessment and Plan / UC Course  ?I have reviewed the triage vital signs and the nursing notes. ? ?Pertinent labs & imaging results that were available during my care of the patient were reviewed by me and considered in my medical decision making (see chart for details). ? ?  ?Bilateral otitis media, sinusitis.  Treating with amoxicillin.  Discussed symptomatic treatment, including Tylenol as needed.  Instructed patient to follow up with his PCP if his symptoms are not improving.  She agrees to plan of care.  ? ? ?Final Clinical Impressions(s) / UC Diagnoses  ? ?Final diagnoses:  ?Bilateral otitis media, unspecified otitis media type  ?Acute non-recurrent maxillary sinusitis  ? ? ? ?Discharge Instructions   ? ?  ?Take the amoxicillin as directed.  Follow up with your primary care provider if your symptoms are not improving.   ? ? ? ? ? ?ED Prescriptions   ? ? Medication Sig Dispense Auth. Provider  ? amoxicillin (AMOXIL) 875 MG tablet Take 1 tablet (875 mg total) by mouth 2 (two) times daily for 7 days. 14 tablet Mickie Bail, NP  ? ?  ? ?PDMP not reviewed this encounter. ?  ?Mickie Bail, NP ?12/13/21 1030 ? ?

## 2021-12-13 NOTE — Discharge Instructions (Addendum)
Take the amoxicillin as directed.  Follow up with your primary care provider if your symptoms are not improving.   ° ° °

## 2021-12-18 ENCOUNTER — Encounter: Payer: Self-pay | Admitting: Internal Medicine

## 2021-12-18 ENCOUNTER — Ambulatory Visit: Payer: Managed Care, Other (non HMO) | Admitting: Internal Medicine

## 2021-12-18 VITALS — BP 118/80 | HR 55 | Temp 97.8°F | Resp 14 | Ht 75.0 in | Wt 377.4 lb

## 2021-12-18 DIAGNOSIS — H6690 Otitis media, unspecified, unspecified ear: Secondary | ICD-10-CM

## 2021-12-18 DIAGNOSIS — J309 Allergic rhinitis, unspecified: Secondary | ICD-10-CM | POA: Diagnosis not present

## 2021-12-18 DIAGNOSIS — H6981 Other specified disorders of Eustachian tube, right ear: Secondary | ICD-10-CM | POA: Diagnosis not present

## 2021-12-18 MED ORDER — AMOXICILLIN-POT CLAVULANATE 875-125 MG PO TABS: 1.0000 | ORAL_TABLET | Freq: Two times a day (BID) | ORAL | 0 refills | Status: DC

## 2021-12-18 MED ORDER — CIPROFLOXACIN-DEXAMETHASONE 0.3-0.1 % OT SUSP: 4.0000 [drp] | Freq: Two times a day (BID) | OTIC | 0 refills | Status: DC

## 2021-12-18 NOTE — Telephone Encounter (Signed)
LMTCB to try to get patient scheduled.  ?

## 2021-12-18 NOTE — Patient Instructions (Addendum)
Nasal saline,flonase  ?Over the counter allergy pill  ?Sudafed x 3 days or prednisone  ?Let me know if you want a referral ENT/allergy  ? ?Earache, Adult ?An earache, or ear pain, can be caused by many things, including: ?An infection. ?Ear wax buildup. ?Ear pressure. ?Something in the ear that should not be there (foreign body). ?A sore throat. ?Tooth problems. ?Jaw problems. ?Treatment of the earache will depend on the cause. If the cause is not clear or cannot be determined, you may need to watch your symptoms until your earache goes away or until a cause is found. ?Follow these instructions at home: ?Medicines ?Take or apply over-the-counter and prescription medicines only as told by your health care provider. ?If you were prescribed an antibiotic medicine, use it as told by your health care provider. Do not stop using the antibiotic even if you start to feel better. ?Do not put anything in your ear other than medicine that is prescribed by your health care provider. ?Managing pain ?If directed, apply heat to the affected area as often as told by your health care provider. Use the heat source that your health care provider recommends, such as a moist heat pack or a heating pad. ?Place a towel between your skin and the heat source. ?Leave the heat on for 20-30 minutes. ?Remove the heat if your skin turns bright red. This is especially important if you are unable to feel pain, heat, or cold. You may have a greater risk of getting burned. ?If directed, put ice on the affected area as often as told by your health care provider. To do this: ?  ?Put ice in a plastic bag. ?Place a towel between your skin and the bag. ?Leave the ice on for 20 minutes, 2-3 times a day. ?General instructions ?Pay attention to any changes in your symptoms. ?Try resting in an upright position instead of lying down. This may help to reduce pressure in your ear and relieve pain. ?Chew gum if it helps to relieve your ear pain. ?Treat any  allergies as told by your health care provider. ?Drink enough fluid to keep your urine pale yellow. ?It is up to you to get the results of any tests that were done. Ask your health care provider, or the department that is doing the tests, when your results will be ready. ?Keep all follow-up visits as told by your health care provider. This is important. ?Contact a health care provider if: ?Your pain does not improve within 2 days. ?Your earache gets worse. ?You have new symptoms. ?You have a fever. ?Get help right away if you: ?Have a severe headache. ?Have a stiff neck. ?Have trouble swallowing. ?Have redness or swelling behind your ear. ?Have fluid or blood coming from your ear. ?Have hearing loss. ?Feel dizzy. ?Summary ?An earache, or ear pain, can be caused by many things. ?Treatment of the earache will depend on the cause. Follow recommendations from your health care provider to treat your ear pain. ?If the cause is not clear or cannot be determined, you may need to watch your symptoms until your earache goes away or until a cause is found. ?Keep all follow-up visits as told by your health care provider. This is important. ?This information is not intended to replace advice given to you by your health care provider. Make sure you discuss any questions you have with your health care provider. ?Document Revised: 04/10/2019 Document Reviewed: 04/10/2019 ?Elsevier Patient Education ? [place]. ? ?Allergies, Adult ?  An allergy is a condition in which the body's defense system (immune system) comes in contact with an allergen and reacts to it. An allergen is anything that causes an allergic reaction. Allergens cause the immune system to make proteins for fighting infections (antibodies). These antibodies cause cells to release chemicals called histamines that set off the symptoms of an allergic reaction. ?Allergies often affect the nasal passages (allergic rhinitis), eyes (allergic conjunctivitis), skin  (atopic dermatitis), and stomach. Allergies can be mild, moderate, or severe. They cannot spread from person to person. Allergies can develop at any age and may be outgrown. ?What are the causes? ?This condition is caused by allergens. Common allergens include: ?Outdoor allergens, such as pollen, car fumes, and mold. ?Indoor allergens, such as dust, smoke, mold, and pet dander. ?Other allergens, such as foods, medicines, scents, insect bites or stings, and other skin irritants. ?What increases the risk? ?You are more likely to develop this condition if you have: ?Family members with allergies. ?Family members who have any condition that may be caused by allergens, such as asthma. This may make you more likely to have other allergies. ?What are the signs or symptoms? ?Symptoms of this condition depend on the severity of the allergy. ?Mild to moderate symptoms ?Runny nose, stuffy nose (nasal congestion), or sneezing. ?Itchy mouth, ears, or throat. ?A feeling of mucus dripping down the back of your throat (postnasal drip). ?Sore throat. ?Itchy, red, watery, or puffy eyes. ?Skin rash, or itchy, red, swollen areas of skin (hives). ?Stomach cramps or bloating. ?Severe symptoms ?Severe allergies to food, medicine, or insect bites may cause anaphylaxis, which can be life-threatening. Symptoms include: ?A red (flushed) face. ?Wheezing or coughing. ?Swollen lips, tongue, or mouth. ?Tight or swollen throat. ?Chest pain or tightness, or rapid heartbeat. ?Trouble breathing or shortness of breath. ?Pain in the abdomen, vomiting, or diarrhea. ?Dizziness or fainting. ?How is this diagnosed? ?This condition is diagnosed based on your symptoms, your family and medical history, and a physical exam. You may also have tests, including: ?Skin tests to see how your skin reacts to allergens that may be causing your symptoms. Tests include: ?Skin prick test. For this test, an allergen is introduced to your body through a small opening in  the skin. ?Intradermal skin test. For this test, a small amount of allergen is injected under the first layer of your skin. ?Patch test. For this test, a small amount of allergen is placed on your skin. The area is covered and then checked after a few days. ?Blood tests. ?A challenge test. For this test, you will eat or breathe in a small amount of allergen to see if you have an allergic reaction. ?You may also be asked to: ?Keep a food diary. This is a record of all the foods, drinks, and symptoms you have in a day. ?Try an elimination diet. To do this: ?Remove certain foods from your diet. ?Add those foods back one by one to find out if any foods cause an allergic reaction. ?How is this treated? ?  ?Treatment for allergies depends on your symptoms. Treatment may include: ?Cold, wet cloths (cold compresses) to soothe itching and swelling. ?Eye drops or nasal sprays. ?Nasal irrigation to help clear your mucus or keep the nasal passages moist. ?A humidifier to add moisture to the air. ?Skin creams to treat rashes or itching. ?Oral antihistamines or other medicines to block the reaction or to treat inflammation. ?Diet changes to remove foods that cause allergies. ?Being exposed again and  again to tiny amounts of allergens to help you build a defense against it (tolerance). This is called immunotherapy. Examples include: ?Allergy shot. You receive an injection that contains an allergen. ?Sublingual immunotherapy. You take a small dose of allergen under your tongue. ?Emergency injection for anaphylaxis. You give yourself a shot using a syringe (auto-injector) that contains the amount of medicine you need. Your health care provider will teach you how to give yourself an injection. ?Follow these instructions at home: ?Medicines ? ?Take or apply over-the-counter and prescription medicines only as told by your health care provider. ?Always carry your auto-injector pen if you are at risk of anaphylaxis. Give yourself an  injection as told by your health care provider. ?Eating and drinking ?Follow instructions from your health care provider about eating or drinking restrictions. ?Drink enough fluid to keep your urine pale yellow. ?Gen

## 2021-12-18 NOTE — Progress Notes (Signed)
Chief Complaint  ?Patient presents with  ? Acute Visit  ?  R clogged ear ongoing for 1 wk. Saw [facility] last wk for sinus infection, they took a look at his ear and saw some redness inside. Was prescribed amoxicillin has 1 dose left congestion clearing up. But is having muffled hearing in ear. Denies any pain.   ? ?F/u  ?1. UC visit 12/13/21 for b/l OM he does have h/o allergies on singular and otc allergy medication and txed amoxil 875 mg bid since 12/13/21 he was having sinus sx's 1 week ago somewhat improved. He had some redness in his ears and now having muffled ear hearing right ear he took several home covid test which were negative ? ?Review of Systems  ?Constitutional:  Negative for weight loss.  ?HENT:  Negative for hearing loss.   ?Eyes:  Negative for blurred vision.  ?Respiratory:  Negative for shortness of breath.   ?Cardiovascular:  Negative for chest pain.  ?Gastrointestinal:  Negative for abdominal pain and blood in stool.  ?Musculoskeletal:  Negative for back pain.  ?Skin:  Negative for rash.  ?Neurological:  Negative for headaches.  ?Psychiatric/Behavioral:  Negative for depression.   ?Past Medical History:  ?Diagnosis Date  ? Acne   ? [place] [facility]  ? Anxiety   ? Anxiety   ? Asthma   ? seasonal allergies, Dx age 89  ? Depression   ? Fatty liver   ? GERD (gastroesophageal reflux disease)   ? Headache   ? Hypertension 2012  ? Dr. Grace Isaac  ? ?Past Surgical History:  ?Procedure Laterality Date  ? ADENOIDECTOMY    ? ESOPHAGOGASTRODUODENOSCOPY (EGD) WITH PROPOFOL N/A 03/16/2015  ? Procedure: ESOPHAGOGASTRODUODENOSCOPY (EGD) WITH PROPOFOL;  Surgeon: Inda Castle, MD;  Location: [facility];  Service: Endoscopy;  Laterality: N/A;  ? TONSILLECTOMY    ? ?Family History  ?Problem Relation Age of Onset  ? Hypertension Father   ? Heart disease Paternal Grandfather   ? Hypertension Paternal Grandfather   ? Aortic aneurysm Paternal Grandfather   ? Diabetes Mother   ? Diabetes Maternal Uncle   ?     x2  ? Colon  cancer Neg Hx   ? Colon polyps Neg Hx   ? Esophageal cancer Neg Hx   ? Kidney disease Neg Hx   ? Gallbladder disease Neg Hx   ? ?Social History  ? ?Socioeconomic History  ? Marital status: Single  ?  Spouse name: Not on file  ? Number of children: 0  ? Years of education: Not on file  ? Highest education level: Not on file  ?Occupational History  ? Occupation: Ship broker  ?  Comment: [employer]  ?Tobacco Use  ? Smoking status: Never  ? Smokeless tobacco: Never  ?Substance and Sexual Activity  ? Alcohol use: No  ?  Alcohol/week: 0.0 standard drinks  ? Drug use: No  ? Sexual activity: Never  ?Other Topics Concern  ? Not on file  ?Social History Narrative  ? Lives in [place] family. Pets - dog and cat.  ?   ? School - [employer], rising senior. Plans to major in accountant.  ?   ? Diet - regular, tries to limit processed sugars  ?   ? Exercise - started basketball, swimming  ? ?Social Determinants of Health  ? ?Financial Resource Strain: Not on file  ?Food Insecurity: Not on file  ?Transportation Needs: Not on file  ?Physical Activity: Not on file  ?Stress: Not on file  ?  Social Connections: Not on file  ?Intimate Partner Violence: Not on file  ? ?Current Meds  ?Medication Sig  ? amoxicillin (AMOXIL) 875 MG tablet Take 1 tablet (875 mg total) by mouth 2 (two) times daily for 7 days.  ? amoxicillin-clavulanate (AUGMENTIN) 875-125 MG tablet Take 1 tablet by mouth 2 (two) times daily. With food  ? Cholecalciferol (VITAMIN D3) 5000 units CAPS Take 1 capsule by mouth daily.  ? ciprofloxacin-dexamethasone (CIPRODEX) OTIC suspension Place 4 drops into the right ear 2 (two) times daily. X4-7 days  ? losartan (COZAAR) 50 MG tablet TAKE 1 TABLET BY MOUTH EVERY DAY AT BEDTIME  ? metoprolol tartrate (LOPRESSOR) 50 MG tablet Take 1.5 tablets (75 mg total) by mouth 2 (two) times daily.  ? montelukast (SINGULAIR) 10 MG tablet Take 1 tablet (10 mg total) by mouth at bedtime.  ? sertraline (ZOLOFT) 100 MG tablet Take 2 tablets (200 mg  total) by mouth daily.  ? ?No Known Allergies ?Recent Results (from the past 2160 hour(s))  ?B12 and Folate Panel     Status: None  ? Collection Time: 11/26/21 10:02 AM  ?Result Value Ref Range  ? Vitamin B-12 572 232 - 1,245 pg/mL  ? Folate 9.0 >3.0 ng/mL  ?  Comment: A serum folate concentration of less than 3.1 ng/mL is ?considered to represent clinical deficiency. ?  ?CBC with Differential/Platelet     Status: None  ? Collection Time: 11/26/21 10:02 AM  ?Result Value Ref Range  ? WBC 7.3 3.4 - 10.8 x10E3/uL  ? RBC 5.16 4.14 - 5.80 x10E6/uL  ? Hemoglobin 15.7 13.0 - 17.7 g/dL  ? Hematocrit 46.8 37.5 - 51.0 %  ? MCV 91 79 - 97 fL  ? MCH 30.4 26.6 - 33.0 pg  ? MCHC 33.5 31.5 - 35.7 g/dL  ? RDW 12.8 11.6 - 15.4 %  ? Platelets 256 150 - 450 x10E3/uL  ? Neutrophils 53 Not Estab. %  ? Lymphs 35 Not Estab. %  ? Monocytes 9 Not Estab. %  ? Eos 2 Not Estab. %  ? Basos 1 Not Estab. %  ? Neutrophils Absolute 3.9 1.4 - 7.0 x10E3/uL  ? Lymphocytes Absolute 2.6 0.7 - 3.1 x10E3/uL  ? Monocytes Absolute 0.6 0.1 - 0.9 x10E3/uL  ? EOS (ABSOLUTE) 0.2 0.0 - 0.4 x10E3/uL  ? Basophils Absolute 0.0 0.0 - 0.2 x10E3/uL  ? Immature Granulocytes 0 Not Estab. %  ? Immature Grans (Abs) 0.0 0.0 - 0.1 x10E3/uL  ?Comp Met (CMET)     Status: None  ? Collection Time: 11/26/21 10:02 AM  ?Result Value Ref Range  ? Glucose 83 70 - 99 mg/dL  ? BUN 11 6 - 20 mg/dL  ? Creatinine, Ser 0.79 0.76 - 1.27 mg/dL  ? eGFR 126 >59 mL/min/1.73  ? BUN/Creatinine Ratio 14 9 - 20  ? Sodium 139 134 - 144 mmol/L  ? Potassium 4.7 3.5 - 5.2 mmol/L  ? Chloride 105 96 - 106 mmol/L  ? CO2 20 20 - 29 mmol/L  ? Calcium 8.8 8.7 - 10.2 mg/dL  ? Total Protein 6.9 6.0 - 8.5 g/dL  ? Albumin 4.2 4.1 - 5.2 g/dL  ? Globulin, Total 2.7 1.5 - 4.5 g/dL  ? Albumin/Globulin Ratio 1.6 1.2 - 2.2  ? Bilirubin Total 0.4 0.0 - 1.2 mg/dL  ? Alkaline Phosphatase 102 44 - 121 IU/L  ? AST 27 0 - 40 IU/L  ? ALT 32 0 - 44 IU/L  ?HgB A1c  Status: None  ? Collection Time: 11/26/21 10:02 AM   ?Result Value Ref Range  ? Hgb A1c MFr Bld 4.8 4.8 - 5.6 %  ?  Comment:          Prediabetes: 5.7 - 6.4 ?         Diabetes: >6.4 ?         Glycemic control for adults with diabetes: <7.0 ?  ? Est. average glucose Bld gHb Est-mCnc 91 mg/dL  ?Lipid Profile     Status: Abnormal  ? Collection Time: 11/26/21 10:02 AM  ?Result Value Ref Range  ? Cholesterol, Total 117 100 - 199 mg/dL  ? Triglycerides 63 0 - 149 mg/dL  ? HDL 35 (L) >39 mg/dL  ? VLDL Cholesterol Cal 14 5 - 40 mg/dL  ? LDL Chol Calc (NIH) 68 0 - 99 mg/dL  ? Chol/HDL Ratio 3.3 0.0 - 5.0 ratio  ?  Comment:                                   T. Chol/HDL Ratio ?                                            Men  Women ?                              1/2 Avg.Risk  3.4    3.3 ?                                  Avg.Risk  5.0    4.4 ?                               2X Avg.Risk  9.6    7.1 ?                               3X Avg.Risk 23.4   11.0 ?  ?Magnesium     Status: None  ? Collection Time: 11/26/21 10:02 AM  ?Result Value Ref Range  ? Magnesium 2.1 1.6 - 2.3 mg/dL  ?TSH     Status: None  ? Collection Time: 11/26/21 10:02 AM  ?Result Value Ref Range  ? TSH 1.550 0.450 - 4.500 uIU/mL  ?Urine Microalbumin w/creat. ratio     Status: None  ? Collection Time: 11/26/21 10:02 AM  ?Result Value Ref Range  ? Creatinine, Urine 72.0 Not Estab. mg/dL  ? Microalbumin, Urine <3.0 Not Estab. ug/mL  ?  Comment: **Verified by repeat analysis**  ? Microalb/Creat Ratio <4 0 - 29 mg/g creat  ?  Comment:                        Normal:                0 -  29 ?                       Moderately increased: 30 - 300 ?  Severely increased:       >300 ?  ?VITAMIN D 25 Hydroxy (Vit-D Deficiency, Fractures)     Status: Abnormal  ? Collection Time: 11/26/21 10:02 AM  ?Result Value Ref Range  ? Vit D, 25-Hydroxy 26.5 (L) 30.0 - 100.0 ng/mL  ?  Comment: Vitamin D deficiency has been defined by the Institute of ?Medicine and an Endocrine Society practice guideline as a ?level of  serum 25-OH vitamin D less than 20 ng/mL (1,2). ?The Endocrine Society went on to further define vitamin D ?insufficiency as a level between 21 and 29 ng/mL (2). ?1. IOM (Institute of Medicine). 2010. Dietary referenc

## 2022-01-14 ENCOUNTER — Other Ambulatory Visit: Payer: Self-pay | Admitting: Internal Medicine

## 2022-03-20 ENCOUNTER — Ambulatory Visit: Payer: Managed Care, Other (non HMO) | Admitting: Internal Medicine

## 2022-03-28 ENCOUNTER — Telehealth: Payer: Self-pay | Admitting: Cardiology

## 2022-03-28 NOTE — Telephone Encounter (Signed)
STAT if HR is under 50 or over 120 (normal HR is 60-100 beats per minute)  What is your heart rate?   Do you have a log of your heart rate readings (document readings)?   Do you have any other symptoms?    Patient sent mychart message, attempted to call, no answer.

## 2022-04-22 ENCOUNTER — Ambulatory Visit: Payer: Managed Care, Other (non HMO) | Admitting: Internal Medicine

## 2022-04-22 ENCOUNTER — Encounter: Payer: Self-pay | Admitting: Internal Medicine

## 2022-04-22 DIAGNOSIS — F411 Generalized anxiety disorder: Secondary | ICD-10-CM | POA: Diagnosis not present

## 2022-04-22 MED ORDER — PAROXETINE HCL ER 25 MG PO TB24: 25.0000 mg | ORAL_TABLET | Freq: Every day | ORAL | 2 refills | Status: DC

## 2022-04-22 NOTE — Progress Notes (Unsigned)
Subjective:  Patient ID: Larry Rogers, male    DOB: [birth year]  Age: 26 y.o. MRN: [id number]  CC: There were no encounter diagnoses.   HPI Larry Rogers presents for  Chief Complaint  Patient presents with   Follow-up    Follow up on depression and anxiety   1) follow up on depression and anxiety:  patient was recently involved in a  relationship with a 26 yr old co employee .  The two moved into an apartment together and he quickly became financially responsible for her to the point of using his 53 retirement savings and cosigning a loan for her to resolve her financial debts.  He ended the relationship on July 16 but she brought the  conflict to the office  and the two had a loud verbal argument at the office, witnessed only by her best friend.  There were no physical accusations ,  only verbally derogotary insults by both parties. .    He had been living with his girlfriends for  4 months,  after the girfriend had moved in with patient and patient's family.  Once they moved into their own apartment, it became apparent that she was abusing alcohol and THC.   The relationship ended July 16  and he returned home. He missed 2 weeks of work as a result,  from July 17 to July 21,  and from July 24 to July 27 due to his girlfriend's spreading rumors and gossip at the office.  He left work on July 17 and moved back home with his parents,  and mad arrangements to terminate the lease.  He has been staying at home, staying in his room. He feels more anxious  fidgety,  having difficulty focusing on anything for any period of time.   2) morbid obesity: has regained 43 lbs since reaching a nadir of 340 lbs April 2022 . Developed bad eating habits while living with girlfriend.    Outpatient Medications Prior to Visit  Medication Sig Dispense Refill   Cholecalciferol (VITAMIN D3) 5000 units CAPS Take 1 capsule by mouth daily.     losartan (COZAAR) 50 MG tablet TAKE 1 TABLET BY MOUTH EVERY DAY AT  BEDTIME 90 tablet 1   metoprolol tartrate (LOPRESSOR) 50 MG tablet Take 1.5 tablets (75 mg total) by mouth 2 (two) times daily. 270 tablet 3   montelukast (SINGULAIR) 10 MG tablet Take 1 tablet (10 mg total) by mouth at bedtime. 90 tablet 4   sertraline (ZOLOFT) 100 MG tablet Take 2 tablets (200 mg total) by mouth daily. 180 tablet 2   amoxicillin-clavulanate (AUGMENTIN) 875-125 MG tablet Take 1 tablet by mouth 2 (two) times daily. With food 14 tablet 0   ciprofloxacin-dexamethasone (CIPRODEX) OTIC suspension Place 4 drops into the right ear 2 (two) times daily. X4-7 days (Patient not taking: Reported on 04/22/2022) 7.5 mL 0   No facility-administered medications prior to visit.    Review of Systems;  Patient denies headache, fevers, malaise, unintentional weight loss, skin rash, eye pain, sinus congestion and sinus pain, sore throat, dysphagia,  hemoptysis , cough, dyspnea, wheezing, chest pain, palpitations, orthopnea, edema, abdominal pain, nausea, melena, diarrhea, constipation, flank pain, dysuria, hematuria, urinary  Frequency, nocturia, numbness, tingling, seizures,  Focal weakness, Loss of consciousness,  Tremor, insomnia, depression, anxiety, and suicidal ideation.      Objective:  BP 120/76 (BP Location: Left Arm, Patient Position: Sitting, Cuff Size: Large)   Pulse 65   Temp 97.9 F (  36.6 C) (Oral)   Ht 6\' 3"  (1.905 m)   Wt (!) 383 lb 1.9 oz (173.8 kg)   SpO2 97%   BMI 47.89 kg/m   BP Readings from Last 3 Encounters:  04/22/22 120/76  12/18/21 118/80  12/13/21 121/77    Wt Readings from Last 3 Encounters:  04/22/22 (!) 383 lb 1.9 oz (173.8 kg)  12/18/21 (!) 377 lb 6.4 oz (171.2 kg)  11/26/21 (!) 380 lb 9.6 oz (172.6 kg)    General appearance: alert, cooperative and appears stated age Ears: normal TM's and external ear canals both ears Throat: lips, mucosa, and tongue normal; teeth and gums normal Neck: no adenopathy, no carotid bruit, supple, symmetrical, trachea  midline and thyroid not enlarged, symmetric, no tenderness/mass/nodules Back: symmetric, no curvature. ROM normal. No CVA tenderness. Lungs: clear to auscultation bilaterally Heart: regular rate and rhythm, S1, S2 normal, no murmur, click, rub or gallop Abdomen: soft, non-tender; bowel sounds normal; no masses,  no organomegaly Pulses: 2+ and symmetric Skin: Skin color, texture, turgor normal. No rashes or lesions Lymph nodes: Cervical, supraclavicular, and axillary nodes normal.  Lab Results  Component Value Date   HGBA1C 4.8 11/26/2021   HGBA1C 5.5 03/30/2018   HGBA1C 5.6 08/20/2017    Lab Results  Component Value Date   CREATININE 0.79 11/26/2021   CREATININE 0.67 01/02/2021   CREATININE 0.92 06/29/2018    Lab Results  Component Value Date   WBC 7.3 11/26/2021   HGB 15.7 11/26/2021   HCT 46.8 11/26/2021   PLT 256 11/26/2021   GLUCOSE 83 11/26/2021   CHOL 117 11/26/2021   TRIG 63 11/26/2021   HDL 35 (L) 11/26/2021   LDLCALC 68 11/26/2021   ALT 32 11/26/2021   AST 27 11/26/2021   NA 139 11/26/2021   K 4.7 11/26/2021   CL 105 11/26/2021   CREATININE 0.79 11/26/2021   BUN 11 11/26/2021   CO2 20 11/26/2021   TSH 1.550 11/26/2021   HGBA1C 4.8 11/26/2021   MICROALBUR 1.0 03/30/2018    No results found.  Assessment & Plan:   Problem List Items Addressed This Visit   None   I spent a total of   minutes with this patient in a face to face visit on the date of this encounter reviewing the last office visit with me on        ,  most recent with patient's cardiologist in    ,  patient'ss diet and eating habits, home blood pressure readings ,  most recent imaging study ,   and post visit ordering of testing and therapeutics.    Follow-up: No follow-ups on file.   04/01/2018, MD

## 2022-04-22 NOTE — Assessment & Plan Note (Signed)
Uncontrolled on 200 mg zoloft.  Changing to paxil CR 25 mg daily . Plans to add wellbutrin in 2 weeks if needed

## 2022-04-22 NOTE — Patient Instructions (Signed)
Stop zoloft and start paxil CR once daily  Go for  walk when anxiety gets out of control  Follow up 2 weeks for medication adjustment

## 2022-04-23 DIAGNOSIS — Z0279 Encounter for issue of other medical certificate: Secondary | ICD-10-CM

## 2022-04-23 NOTE — Assessment & Plan Note (Signed)
Weight regain addressed.  He is now benefittign from living with his parents who are committed to helping him restore healthy eating habits

## 2022-04-24 ENCOUNTER — Encounter: Payer: Self-pay | Admitting: Internal Medicine

## 2022-04-24 NOTE — Telephone Encounter (Signed)
Pt mom came in office to pick up papers for pt

## 2022-04-24 NOTE — Telephone Encounter (Signed)
Papers have been copied and original has been given to pt's mother.

## 2022-04-30 ENCOUNTER — Encounter: Payer: Self-pay | Admitting: Internal Medicine

## 2022-04-30 ENCOUNTER — Telehealth: Payer: Self-pay | Admitting: Internal Medicine

## 2022-04-30 NOTE — Telephone Encounter (Signed)
Placed in red folder  

## 2022-04-30 NOTE — Telephone Encounter (Signed)
Pt mom came into office to drop office regroup papers that need to be filled out. Placed in provider folder

## 2022-05-02 NOTE — Telephone Encounter (Signed)
Done and picked up today.

## 2022-05-06 ENCOUNTER — Encounter: Payer: Self-pay | Admitting: Internal Medicine

## 2022-05-06 ENCOUNTER — Telehealth (INDEPENDENT_AMBULATORY_CARE_PROVIDER_SITE_OTHER): Payer: Managed Care, Other (non HMO) | Admitting: Internal Medicine

## 2022-05-06 DIAGNOSIS — F5105 Insomnia due to other mental disorder: Secondary | ICD-10-CM | POA: Diagnosis not present

## 2022-05-06 DIAGNOSIS — G4720 Circadian rhythm sleep disorder, unspecified type: Secondary | ICD-10-CM | POA: Insufficient documentation

## 2022-05-06 DIAGNOSIS — F411 Generalized anxiety disorder: Secondary | ICD-10-CM

## 2022-05-06 DIAGNOSIS — F409 Phobic anxiety disorder, unspecified: Secondary | ICD-10-CM | POA: Diagnosis not present

## 2022-05-06 MED ORDER — TRAZODONE HCL 50 MG PO TABS: 25.0000 mg | ORAL_TABLET | Freq: Every evening | ORAL | 1 refills | Status: DC | PRN

## 2022-05-06 NOTE — Assessment & Plan Note (Signed)
Improved with initiation of Paxil CR 25 mg  QAM.  No changes today.  Encouraged to exercise regularly

## 2022-05-06 NOTE — Assessment & Plan Note (Signed)
Due to working 3rd shift.  Trial of trazodone

## 2022-05-06 NOTE — Progress Notes (Signed)
Virtual Visit via Caregility   Note    This format is felt to be most appropriate for this patient at this time.  All issues noted in this document were discussed and addressed.  No physical exam was performed (except for noted visual exam findings with Video Visits).   I connected with Larry Rogers  on 05/06/22 at  4:30 PM EDT by a video enabled telemedicine application  and verified that I am speaking with the correct person using two identifiers. Location patient: home Location provider: work or home office Persons participating in the virtual visit: patient, provider  I discussed the limitations, risks, security and privacy concerns of performing an evaluation and management service by telephone and the availability of in person appointments. I also discussed with the patient that there may be a patient responsible charge related to this service. The patient expressed understanding and agreed to proceed.   Reason for visit: 2 week follow up on medication initiation for treatment of GAD   HPI:  26 yr old male with GAD aggravated by recent romantic entanglement with a coworker which ended badly .  He has been taking Paxil CR for the last 2 weeks and reports decreased anxiety , good energy level and no panic attacks. Work environment is improving .  Sleep initiation and maintenance  still an issue 2-3 times per week. Still working 3rd shift,  wants to change departments to change work schedule.  No prior trial of trazodone   ROS: See pertinent positives and negatives per HPI.  Past Medical History:  Diagnosis Date   Acne    [place] [facility]   Anxiety    Anxiety    Asthma    seasonal allergies, Dx age 77   Depression    Fatty liver    GERD (gastroesophageal reflux disease)    Headache    Hypertension 2012   Dr. Kathrene Alu    Past Surgical History:  Procedure Laterality Date   ADENOIDECTOMY     ESOPHAGOGASTRODUODENOSCOPY (EGD) WITH PROPOFOL N/A 03/16/2015   Procedure:  ESOPHAGOGASTRODUODENOSCOPY (EGD) WITH PROPOFOL;  Surgeon: Louis Meckel, MD;  Location: [facility];  Service: Endoscopy;  Laterality: N/A;   TONSILLECTOMY      Family History  Problem Relation Age of Onset   Hypertension Father    Heart disease Paternal Grandfather    Hypertension Paternal Grandfather    Aortic aneurysm Paternal Grandfather    Diabetes Mother    Diabetes Maternal Uncle        x2   Colon cancer Neg Hx    Colon polyps Neg Hx    Esophageal cancer Neg Hx    Kidney disease Neg Hx    Gallbladder disease Neg Hx     SOCIAL HX:  reports that he has never smoked. He has never used smokeless tobacco. He reports that he does not drink alcohol and does not use drugs.    Current Outpatient Medications:    Cholecalciferol (VITAMIN D3) 5000 units CAPS, Take 1 capsule by mouth daily., Disp: , Rfl:    losartan (COZAAR) 50 MG tablet, TAKE 1 TABLET BY MOUTH EVERY DAY AT BEDTIME, Disp: 90 tablet, Rfl: 1   metoprolol tartrate (LOPRESSOR) 50 MG tablet, Take 1.5 tablets (75 mg total) by mouth 2 (two) times daily., Disp: 270 tablet, Rfl: 3   montelukast (SINGULAIR) 10 MG tablet, Take 1 tablet (10 mg total) by mouth at bedtime., Disp: 90 tablet, Rfl: 4   PARoxetine (PAXIL CR) 25 MG 24 hr tablet, Take  1 tablet (25 mg total) by mouth daily., Disp: 30 tablet, Rfl: 2   traZODone (DESYREL) 50 MG tablet, Take 0.5-1 tablets (25-50 mg total) by mouth at bedtime as needed for sleep., Disp: 90 tablet, Rfl: 1  EXAM:  VITALS per patient if applicable:  GENERAL: alert, oriented, appears well and in no acute distress  HEENT: atraumatic, conjunttiva clear, no obvious abnormalities on inspection of external nose and ears  NECK: normal movements of the head and neck  LUNGS: on inspection no signs of respiratory distress, breathing rate appears normal, no obvious gross SOB, gasping or wheezing  CV: no obvious cyanosis  MS: moves all visible extremities without noticeable  abnormality  PSYCH/NEURO: pleasant and cooperative, no obvious depression or anxiety, speech and thought processing grossly intact  ASSESSMENT AND PLAN:  Discussed the following assessment and plan:  GAD (generalized anxiety disorder)  Insomnia due to anxiety and fear  Sleep-wake disorder  GAD (generalized anxiety disorder) Improved with initiation of Paxil CR 25 mg  QAM.  No changes today.  Encouraged to exercise regularly   Sleep-wake disorder Due to working 3rd shift.  Trial of trazodone     I discussed the assessment and treatment plan with the patient. The patient was provided an opportunity to ask questions and all were answered. The patient agreed with the plan and demonstrated an understanding of the instructions.   The patient was advised to call back or seek an in-person evaluation if the symptoms worsen or if the condition fails to improve as anticipated.   I spent 20 minutes dedicated to the care of this patient on the date of this encounter to include pre-visit review of his medical history,  Face-to-face time with the patient , and post visit ordering of testing and therapeutics.    Sherlene Shams, MD

## 2022-05-14 ENCOUNTER — Other Ambulatory Visit: Payer: Self-pay | Admitting: Internal Medicine

## 2022-05-14 MED ORDER — METOPROLOL TARTRATE 50 MG PO TABS: 75.0000 mg | ORAL_TABLET | Freq: Two times a day (BID) | ORAL | 3 refills | Status: DC

## 2022-06-07 ENCOUNTER — Encounter: Payer: Self-pay | Admitting: Cardiology

## 2022-06-22 ENCOUNTER — Other Ambulatory Visit: Payer: Self-pay | Admitting: Internal Medicine

## 2022-06-24 ENCOUNTER — Encounter: Payer: Self-pay | Admitting: Internal Medicine

## 2022-07-02 ENCOUNTER — Ambulatory Visit
Admission: RE | Admit: 2022-07-02 | Discharge: 2022-07-02 | Disposition: A | Discharge status: Home / Self Care | Payer: Managed Care, Other (non HMO) | Source: Ambulatory Visit | Attending: Emergency Medicine | Admitting: Emergency Medicine

## 2022-07-02 VITALS — BP 130/83 | HR 94 | Temp 98.4°F | Resp 18 | Ht 75.0 in

## 2022-07-02 DIAGNOSIS — J029 Acute pharyngitis, unspecified: Secondary | ICD-10-CM | POA: Diagnosis not present

## 2022-07-02 LAB — POCT RAPID STREP A (OFFICE): Rapid Strep A Screen: NEGATIVE

## 2022-07-02 NOTE — ED Triage Notes (Signed)
Patient to Urgent Care with complaints of fevers (max 99.6), sore throats, back and neck pain. Also reports that his lymph nodes feel swollen. Symptoms started on Sunday.

## 2022-07-02 NOTE — ED Provider Notes (Signed)
Roderic Palau    CSN: [id number] Arrival date & time: 07/02/22  1022      History   Chief Complaint Chief Complaint  Patient presents with   Sore Throat    Have had a fever, sore throat, back pain and neck pain. Lympnodes feel swollen. - Entered by patient    HPI JARROLD KOHLMAN is a 26 y.o. male.  Patient presents with 2 day history of fever, body aches, sore throat.  Tmax 100.  No OTC medications taken today.  No rash, congestion, cough, shortness of breath, vomiting, diarrhea, or other symptoms.  His medical history includes hypertension, asthma, seasonal allergies, GERD, morbid obesity.  The history is provided by the patient and medical records.    Past Medical History:  Diagnosis Date   Acne    [place]   Anxiety    Anxiety    Asthma    seasonal allergies, Dx age 27   Depression    Fatty liver    GERD (gastroesophageal reflux disease)    Headache    Hypertension 2012   Dr. 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   Allergic rhinitis 12/18/2021   S/P bariatric surgery 08/08/2019   Bilateral lower extremity edema 12/17/2016   Hepatic steatosis 12/17/2016   Lower abdominal pain 07/01/2016   Snoring 07/04/2015   History of atrial fibrillation 06/14/2015   Thyroid dysfunction 06/14/2015   GAD (generalized anxiety disorder) 02/14/2015   Seasonal allergies 12/20/2014   Gastroesophageal reflux disease without esophagitis 08/15/2014   Essential hypertension, benign 02/25/2013   Morbid obesity ([place]) 02/25/2013    Past Surgical History:  Procedure Laterality Date   ADENOIDECTOMY     ESOPHAGOGASTRODUODENOSCOPY (EGD) WITH PROPOFOL N/A 03/16/2015   Procedure: ESOPHAGOGASTRODUODENOSCOPY (EGD) WITH PROPOFOL;  Surgeon: Inda Castle, MD;  Location: [facility];  Service: Endoscopy;  Laterality: N/A;   TONSILLECTOMY         Home Medications    Prior to Admission medications   Medication  Sig Start Date End Date Taking? Authorizing Provider  Cholecalciferol (VITAMIN D3) 5000 units CAPS Take 1 capsule by mouth daily.    [provider]  losartan (COZAAR) 50 MG tablet TAKE 1 TABLET BY MOUTH EVERY DAY AT BEDTIME 01/14/22   Crecencio Mc, MD  metoprolol tartrate (LOPRESSOR) 50 MG tablet Take 1.5 tablets (75 mg total) by mouth 2 (two) times daily. 05/14/22 08/12/22  Crecencio Mc, MD  montelukast (SINGULAIR) 10 MG tablet Take 1 tablet (10 mg total) by mouth at bedtime. 11/26/21   Crecencio Mc, MD  PARoxetine (PAXIL-CR) 25 MG 24 hr tablet TAKE 1 TABLET(25 MG) BY MOUTH DAILY 06/24/22   Crecencio Mc, MD  traZODone (DESYREL) 50 MG tablet Take 0.5-1 tablets (25-50 mg total) by mouth at bedtime as needed for sleep. 05/06/22   Crecencio Mc, MD    Family History Family History  Problem Relation Age of Onset   Hypertension Father    Heart disease Paternal Grandfather    Hypertension Paternal Grandfather    Aortic aneurysm Paternal Grandfather    Diabetes Mother    Diabetes Maternal Uncle        x2   Colon cancer Neg Hx    Colon polyps Neg Hx    Esophageal cancer Neg Hx    Kidney disease Neg Hx    Gallbladder disease Neg Hx     Social History Social History  Tobacco Use   Smoking status: Never   Smokeless tobacco: Never  Substance Use Topics   Alcohol use: No    Alcohol/week: 0.0 standard drinks of alcohol   Drug use: No     Allergies   Patient has no known allergies.   Review of Systems Review of Systems  Constitutional:  Positive for fever. Negative for chills.  HENT:  Positive for sore throat. Negative for ear pain.   Respiratory:  Negative for cough and shortness of breath.   Cardiovascular:  Negative for chest pain and palpitations.  Gastrointestinal:  Negative for diarrhea and vomiting.  Skin:  Negative for rash.  All other systems reviewed and are negative.    Physical Exam Triage Vital Signs ED Triage Vitals  Enc Vitals Group     BP  07/02/22 1029 130/83     Pulse Rate 07/02/22 1029 94     Resp 07/02/22 1029 18     Temp 07/02/22 1029 98.4 F (36.9 C)     Temp src --      SpO2 07/02/22 1029 97 %     Weight --      Height 07/02/22 1031 6\' 3"  (1.905 m)     Head Circumference --      Peak Flow --      Pain Score 07/02/22 1029 6     Pain Loc --      Pain Edu? --      Excl. in [place]? --    No data found.  Updated Vital Signs BP 130/83   Pulse 94   Temp 98.4 F (36.9 C)   Resp 18   Ht 6\' 3"  (1.905 m)   SpO2 97%   BMI 47.87 kg/m   Visual Acuity Right Eye Distance:   Left Eye Distance:   Bilateral Distance:    Right Eye Near:   Left Eye Near:    Bilateral Near:     Physical Exam Vitals and nursing note reviewed.  Constitutional:      General: He is not in acute distress.    Appearance: He is well-developed. He is obese. He is not ill-appearing.  HENT:     Right Ear: Tympanic membrane normal.     Left Ear: Tympanic membrane normal.     Nose: Nose normal.     Mouth/Throat:     Mouth: Mucous membranes are moist.     Pharynx: Oropharynx is clear.  Cardiovascular:     Rate and Rhythm: Normal rate and regular rhythm.     Heart sounds: Normal heart sounds.  Pulmonary:     Effort: Pulmonary effort is normal. No respiratory distress.     Breath sounds: Normal breath sounds.  Musculoskeletal:     Cervical back: Neck supple.  Skin:    General: Skin is warm and dry.  Neurological:     Mental Status: He is alert.  Psychiatric:        Mood and Affect: Mood normal.        Behavior: Behavior normal.      UC Treatments / Results  Labs (all labs ordered are listed, but only abnormal results are displayed) Labs Reviewed  POCT RAPID STREP A (OFFICE)    EKG   Radiology No results found.  Procedures Procedures (including critical care time)  Medications Ordered in UC Medications - No data to display  Initial Impression / Assessment and Plan / UC Course  I have reviewed the triage vital signs  and the nursing notes.  Pertinent labs & imaging results that were available during my care of the patient were reviewed by me and considered in my medical decision making (see chart for details).    Viral pharyngitis.  Rapid strep negative.  Patient declines COVID test as he had a negative test at home yesterday.  Discussed symptomatic treatment including Tylenol or ibuprofen, rest, hydration.  Instructed patient to follow up with his PCP if his symptoms are not improving.  He agrees to plan of care.   Final Clinical Impressions(s) / UC Diagnoses   Final diagnoses:  Viral pharyngitis     Discharge Instructions      Your rapid strep test is negative.    Take Tylenol or ibuprofen as needed for fever or discomfort.    Follow-up with your primary care provider if your symptoms are not improving.         ED Prescriptions   None    PDMP not reviewed this encounter.   Sharion Balloon, NP 07/02/22 1058

## 2022-07-02 NOTE — Discharge Instructions (Addendum)
Your rapid strep test is negative.    Take Tylenol or ibuprofen as needed for fever or discomfort.    Follow up with your primary care provider if your symptoms are not improving.     

## 2022-07-15 ENCOUNTER — Encounter: Payer: Self-pay | Admitting: Internal Medicine

## 2022-07-15 ENCOUNTER — Ambulatory Visit: Payer: Managed Care, Other (non HMO) | Admitting: Internal Medicine

## 2022-07-15 DIAGNOSIS — F418 Other specified anxiety disorders: Secondary | ICD-10-CM

## 2022-07-15 DIAGNOSIS — M67971 Unspecified disorder of synovium and tendon, right ankle and foot: Secondary | ICD-10-CM | POA: Diagnosis not present

## 2022-07-15 DIAGNOSIS — Z9884 Bariatric surgery status: Secondary | ICD-10-CM | POA: Diagnosis not present

## 2022-07-15 MED ORDER — BUPROPION HCL ER (XL) 150 MG PO TB24: 150.0000 mg | ORAL_TABLET | Freq: Every day | ORAL | 2 refills | Status: DC

## 2022-07-15 NOTE — Patient Instructions (Signed)
I recommend adding a low dose of Wellbutrin to the Paxil you are taking, TO HELP manage your depression .  I agree that it's time for some talk therapy,,  and I have attached a list of several well respected male therapists in the area that you can call    Your achilles tendon is enflamed,   so I am referring you to [facility]

## 2022-07-15 NOTE — Progress Notes (Unsigned)
Subjective:  Patient ID: Larry [place], male    DOB: 08-Apr-1996  Age: 26 y.o. MRN: [id number]  CC: There were no encounter diagnoses.   HPI Larry [place] presents for  Chief Complaint  Patient presents with   Follow-up    Follow up on depression medication    Back on first shift.  Sleeping better,  but depression worse.  Having trust issues; overthinks everything .  Wants talk therapy  Heel pain right heel achilles knob worse with weight bearing  started 2 months ago   Obesity:  has regained 50 lbs.  Walks his dogs.    Outpatient Medications Prior to Visit  Medication Sig Dispense Refill   Cholecalciferol (VITAMIN D3) 5000 units CAPS Take 1 capsule by mouth daily.     losartan (COZAAR) 50 MG tablet TAKE 1 TABLET BY MOUTH EVERY DAY AT BEDTIME 90 tablet 1   metoprolol tartrate (LOPRESSOR) 50 MG tablet Take 1.5 tablets (75 mg total) by mouth 2 (two) times daily. 270 tablet 3   montelukast (SINGULAIR) 10 MG tablet Take 1 tablet (10 mg total) by mouth at bedtime. 90 tablet 4   PARoxetine (PAXIL-CR) 25 MG 24 hr tablet TAKE 1 TABLET(25 MG) BY MOUTH DAILY 30 tablet 2   traZODone (DESYREL) 50 MG tablet Take 0.5-1 tablets (25-50 mg total) by mouth at bedtime as needed for sleep. 90 tablet 1   No facility-administered medications prior to visit.    Review of Systems;  Patient denies headache, fevers, malaise, unintentional weight loss, skin rash, eye pain, sinus congestion and sinus pain, sore throat, dysphagia,  hemoptysis , cough, dyspnea, wheezing, chest pain, palpitations, orthopnea, edema, abdominal pain, nausea, melena, diarrhea, constipation, flank pain, dysuria, hematuria, urinary  Frequency, nocturia, numbness, tingling, seizures,  Focal weakness, Loss of consciousness,  Tremor, insomnia, depression, anxiety, and suicidal ideation.      Objective:  BP 112/62 (BP Location: Left Arm, Patient Position: Sitting, Cuff Size: Large)   Pulse 67   Temp 97.8 F (36.6 C) (Oral)    Ht 6\' 3"  (1.905 m)   Wt (!) 390 lb (176.9 kg)   SpO2 96%   BMI 48.75 kg/m   BP Readings from Last 3 Encounters:  07/15/22 112/62  07/02/22 130/83  04/22/22 120/76    Wt Readings from Last 3 Encounters:  07/15/22 (!) 390 lb (176.9 kg)  05/06/22 (!) 383 lb (173.7 kg)  04/22/22 (!) 383 lb 1.9 oz (173.8 kg)    General appearance: alert, cooperative and appears stated age Ears: normal TM's and external ear canals both ears Throat: lips, mucosa, and tongue normal; teeth and gums normal Neck: no adenopathy, no carotid bruit, supple, symmetrical, trachea midline and thyroid not enlarged, symmetric, no tenderness/mass/nodules Back: symmetric, no curvature. ROM normal. No CVA tenderness. Lungs: clear to auscultation bilaterally Heart: regular rate and rhythm, S1, S2 normal, no murmur, click, rub or gallop Abdomen: soft, non-tender; bowel sounds normal; no masses,  no organomegaly Pulses: 2+ and symmetric Skin: Skin color, texture, turgor normal. No rashes or lesions Lymph nodes: Cervical, supraclavicular, and axillary nodes normal. Neuro:  awake and interactive with normal mood and affect. Higher cortical functions are normal. Speech is clear without word-finding difficulty or dysarthria. Extraocular movements are intact. Visual fields of both eyes are grossly intact. Sensation to light touch is grossly intact bilaterally of upper and lower extremities. Motor examination shows 4+/5 symmetric hand grip and upper extremity and 5/5 lower extremity strength. There is no pronation or drift. Gait  is non-ataxic   Lab Results  Component Value Date   HGBA1C 4.8 11/26/2021   HGBA1C 5.5 03/30/2018   HGBA1C 5.6 08/20/2017    Lab Results  Component Value Date   CREATININE 0.79 11/26/2021   CREATININE 0.67 01/02/2021   CREATININE 0.92 06/29/2018    Lab Results  Component Value Date   WBC 7.3 11/26/2021   HGB 15.7 11/26/2021   HCT 46.8 11/26/2021   PLT 256 11/26/2021   GLUCOSE 83 11/26/2021    CHOL 117 11/26/2021   TRIG 63 11/26/2021   HDL 35 (L) 11/26/2021   LDLCALC 68 11/26/2021   ALT 32 11/26/2021   AST 27 11/26/2021   NA 139 11/26/2021   K 4.7 11/26/2021   CL 105 11/26/2021   CREATININE 0.79 11/26/2021   BUN 11 11/26/2021   CO2 20 11/26/2021   TSH 1.550 11/26/2021   HGBA1C 4.8 11/26/2021   MICROALBUR 1.0 03/30/2018    No results found.  Assessment & Plan:   Problem List Items Addressed This Visit   None   I spent a total of   minutes with this patient in a face to face visit on the date of this encounter reviewing the last office visit with me in       ,  most recent visit with cardiology ,    ,  patient's diet and exercise habits, home blood pressure /blod sugar readings, recent ER visit including labs and imaging studies ,   and post visit ordering of testing and therapeutics.    Follow-up: No follow-ups on file.   Crecencio Mc, MD

## 2022-07-16 ENCOUNTER — Encounter: Payer: Self-pay | Admitting: Internal Medicine

## 2022-07-16 NOTE — Assessment & Plan Note (Signed)
Weight regain addressed.  He is now benefittngn from living with his parents who are committed to helping him restore healthy eating habits

## 2022-07-16 NOTE — Assessment & Plan Note (Signed)
Continue paxil for anxiety.  Adding wellbutrin for depressive symptoms

## 2022-08-05 ENCOUNTER — Other Ambulatory Visit: Payer: Self-pay

## 2022-08-05 MED ORDER — PAROXETINE HCL ER 25 MG PO TB24: ORAL_TABLET | ORAL | 2 refills | Status: DC

## 2022-08-19 ENCOUNTER — Telehealth: Payer: Self-pay

## 2022-08-19 NOTE — Telephone Encounter (Signed)
LMTCB. Need to find out if pt is changing pharmacy to [facility] because we received a fax from them today requesting a refill.

## 2022-08-27 ENCOUNTER — Encounter: Payer: Self-pay | Admitting: Internal Medicine

## 2022-08-30 ENCOUNTER — Other Ambulatory Visit: Payer: Self-pay | Admitting: Internal Medicine

## 2022-08-30 MED ORDER — BUPROPION HCL ER (XL) 300 MG PO TB24: 300.0000 mg | ORAL_TABLET | Freq: Every day | ORAL | 2 refills | Status: DC

## 2022-09-02 ENCOUNTER — Other Ambulatory Visit: Payer: Self-pay

## 2022-09-02 MED ORDER — BUPROPION HCL ER (XL) 300 MG PO TB24: 300.0000 mg | ORAL_TABLET | Freq: Every day | ORAL | 2 refills | Status: DC

## 2022-09-26 ENCOUNTER — Other Ambulatory Visit: Payer: Self-pay | Admitting: Internal Medicine

## 2022-09-27 ENCOUNTER — Encounter: Payer: Self-pay | Admitting: Internal Medicine

## 2022-09-30 MED ORDER — PAROXETINE HCL ER 25 MG PO TB24: ORAL_TABLET | ORAL | 1 refills | Status: DC

## 2022-10-16 ENCOUNTER — Encounter: Payer: Self-pay | Admitting: Internal Medicine

## 2022-10-16 ENCOUNTER — Ambulatory Visit: Payer: 59 | Admitting: Internal Medicine

## 2022-10-16 VITALS — BP 130/82 | HR 70 | Temp 98.0°F | Resp 15 | Ht 76.0 in | Wt 390.4 lb

## 2022-10-16 DIAGNOSIS — I10 Essential (primary) hypertension: Secondary | ICD-10-CM

## 2022-10-16 DIAGNOSIS — K909 Intestinal malabsorption, unspecified: Secondary | ICD-10-CM

## 2022-10-16 DIAGNOSIS — Z9884 Bariatric surgery status: Secondary | ICD-10-CM

## 2022-10-16 DIAGNOSIS — F418 Other specified anxiety disorders: Secondary | ICD-10-CM

## 2022-10-16 DIAGNOSIS — R7301 Impaired fasting glucose: Secondary | ICD-10-CM | POA: Diagnosis not present

## 2022-10-16 LAB — VITAMIN B12: Vitamin B-12: 305 pg/mL (ref 211–911)

## 2022-10-16 LAB — HEMOGLOBIN A1C: Hgb A1c MFr Bld: 4.8 % (ref 4.6–6.5)

## 2022-10-16 LAB — MAGNESIUM: Magnesium: 1.9 mg/dL (ref 1.5–2.5)

## 2022-10-16 LAB — VITAMIN D 25 HYDROXY (VIT D DEFICIENCY, FRACTURES): VITD: 22.3 ng/mL — ABNORMAL LOW (ref 30.00–100.00)

## 2022-10-16 MED ORDER — TRAZODONE HCL 50 MG PO TABS: 25.0000 mg | ORAL_TABLET | Freq: Every evening | ORAL | 1 refills | Status: DC | PRN

## 2022-10-16 NOTE — Progress Notes (Signed)
Subjective:  Patient ID: Larry [place], male    DOB: 30-Oct-1995  Age: 27 y.o. MRN: [id number]  CC: The primary encounter diagnosis was Essential hypertension, benign. Diagnoses of Depression with anxiety, Intestinal malabsorption, unspecified type, Impaired fasting glucose, Morbid obesity ([place]), and S/P bariatric surgery were also pertinent to this visit.   HPI Larry [place] presents for follow up on GAD/depression and morbid obesity Chief Complaint  Patient presents with   Medical Management of Chronic Issues   1) morbid obesity:  reached nadir of 340 in April 2022  after undergoing . S/p gastic bypass  Nov 2020. Weight regain of 50 lbs addressed.  Denies  overeating  says he tends to "not eat"  when he gets anxious.  Has resumed omeprazole for GERD symptoms .  Exercising intermittently,  none in a week .  Has not had labs since March 2023.  No longer followed by bariatric clinic.   2) GAD/depression :  new stress of being unemployed as of Dec 18 so back in the Job market due to [employer] (replaced by automated processes ). Living with parents.   .not sleeping well, taking 50 mg trazodone,  and 25 mg paxil.  Having  bad dreams currently without a therapist , was given a list at last visit.    3) HTN:  Hypertension: patient checks blood pressure twice weekly at home.  Readings have been for the most part < 140/80 at rest . Patient is following a reduce salt diet most days and is taking losartan and metoprolol  as prescribed   Outpatient Medications Prior to Visit  Medication Sig Dispense Refill   buPROPion (WELLBUTRIN XL) 300 MG 24 hr tablet TAKE 1 TABLET BY MOUTH EVERY DAY 90 tablet 1   Cholecalciferol (VITAMIN D3) 5000 units CAPS Take 1 capsule by mouth daily.     losartan (COZAAR) 50 MG tablet TAKE 1 TABLET BY MOUTH EVERY DAY AT BEDTIME 90 tablet 1   montelukast (SINGULAIR) 10 MG tablet Take 1 tablet (10 mg total) by mouth at bedtime. 90 tablet 4   PARoxetine (PAXIL-CR) 25 MG 24  hr tablet TAKE 1 TABLET(25 MG) BY MOUTH DAILY 90 tablet 1   metoprolol tartrate (LOPRESSOR) 50 MG tablet Take 1.5 tablets (75 mg total) by mouth 2 (two) times daily. 270 tablet 3   traZODone (DESYREL) 50 MG tablet Take 0.5-1 tablets (25-50 mg total) by mouth at bedtime as needed for sleep. 90 tablet 1   No facility-administered medications prior to visit.    Review of Systems;  Patient denies headache, fevers, malaise, unintentional weight loss, skin rash, eye pain, sinus congestion and sinus pain, sore throat, dysphagia,  hemoptysis , cough, dyspnea, wheezing, chest pain, palpitations, orthopnea, edema, abdominal pain, nausea, melena, diarrhea, constipation, flank pain, dysuria, hematuria, urinary  Frequency, nocturia, numbness, tingling, seizures,  Focal weakness, Loss of consciousness,  Tremor, insomnia, depression, anxiety, and suicidal ideation.      Objective:  BP 130/82   Pulse 70   Temp 98 F (36.7 C) (Temporal)   Resp 15   Ht 6\' 4"  (1.93 m)   Wt (!) 390 lb 6.4 oz (177.1 kg)   SpO2 98%   BMI 47.52 kg/m   BP Readings from Last 3 Encounters:  10/16/22 130/82  07/15/22 112/62  07/02/22 130/83    Wt Readings from Last 3 Encounters:  10/16/22 (!) 390 lb 6.4 oz (177.1 kg)  07/15/22 (!) 390 lb (176.9 kg)  05/06/22 (!) 383 lb (173.7  kg)    Physical Exam Vitals reviewed.  Constitutional:      General: He is not in acute distress.    Appearance: Normal appearance. He is normal weight. He is not ill-appearing, toxic-appearing or diaphoretic.  HENT:     Head: Normocephalic.  Eyes:     General: No scleral icterus.       Right eye: No discharge.        Left eye: No discharge.     Conjunctiva/sclera: Conjunctivae normal.  Cardiovascular:     Rate and Rhythm: Normal rate and regular rhythm.     Heart sounds: Normal heart sounds.  Pulmonary:     Effort: Pulmonary effort is normal. No respiratory distress.     Breath sounds: Normal breath sounds.  Musculoskeletal:         General: Normal range of motion.     Cervical back: Normal range of motion.  Skin:    General: Skin is warm and dry.  Neurological:     General: No focal deficit present.     Mental Status: He is alert and oriented to person, place, and time. Mental status is at baseline.  Psychiatric:        Mood and Affect: Mood normal.        Behavior: Behavior normal.        Thought Content: Thought content normal.        Judgment: Judgment normal.     Lab Results  Component Value Date   HGBA1C 4.8 10/16/2022   HGBA1C 4.8 11/26/2021   HGBA1C 5.5 03/30/2018    Lab Results  Component Value Date   CREATININE 0.79 11/26/2021   CREATININE 0.67 01/02/2021   CREATININE 0.92 06/29/2018    Lab Results  Component Value Date   WBC 7.3 11/26/2021   HGB 15.7 11/26/2021   HCT CANCELED 10/16/2022   PLT 256 11/26/2021   GLUCOSE 83 11/26/2021   CHOL 112 10/16/2022   TRIG 87 10/16/2022   HDL 28 (L) 10/16/2022   LDLCALC 67 10/16/2022   ALT 32 11/26/2021   AST 27 11/26/2021   NA 139 11/26/2021   K 4.7 11/26/2021   CL 105 11/26/2021   CREATININE 0.79 11/26/2021   BUN 11 11/26/2021   CO2 20 11/26/2021   TSH 2.720 10/16/2022   HGBA1C 4.8 10/16/2022   MICROALBUR 1.0 03/30/2018    No results found.  Assessment & Plan:  .Essential hypertension, benign -     Lipid Panel w/reflex Direct LDL  Depression with anxiety Assessment & Plan: Aggravated by his job loss. Encouraged to find a therapist. He was given a list of therapists to call at last visit but has not done so.  Continue paxil for anxiety and wellbutrin for depressive symptoms   Intestinal malabsorption, unspecified type -     Vitamin A -     Vitamin B12 -     Vitamin K1, Serum -     VITAMIN D 25 Hydroxy (Vit-D Deficiency, Fractures) -     Zinc -     Vitamin E -     Lipid Panel w/reflex Direct LDL -     Folate, RBC and Serum -     Magnesium -     T4 AND TSH  Impaired fasting glucose -     Hemoglobin A1c  Morbid obesity  (HCC) Assessment & Plan: Continued Weight regain addressed. With 50 lb weight regain thus far . He is making excuses for not exercising and not eating a  healthy diet despite living with his parents,  who  are committed to helping him but becoming frustrated by  his lack of commitment     S/P bariatric surgery Assessment & Plan: Screening labs today for history of  iatrogenic malabsorption ordered   Other orders -     traZODone HCl; Take 0.5-1 tablets (25-50 mg total) by mouth at bedtime as needed for sleep.  Dispense: 90 tablet; Refill: 1 -     Specimen status report -     Metoprolol Tartrate; Take 1.5 tablets (75 mg total) by mouth 2 (two) times daily.  Dispense: 270 tablet; Refill: 0     I provided 30 minutes of face-to-face time during this encounter reviewing patient's last visit with me, patient's , recent surgical and non surgical procedures, previous  labs and imaging studies, counseling on currently addressed issues,  and post visit ordering to diagnostics and therapeutics .   Follow-up: Return in about 3 months (around 01/14/2023) for depression,  morbid obesity.   Crecencio Mc, MD

## 2022-10-16 NOTE — Patient Instructions (Signed)
Ok to increase trazodone dose and take EARLIER IN THE EVENING  GO TO BED WHEN YOU'RE SLEEPY AND SET AN ALARM  GET BACK TO EXERCISING REGULARLY AND DRINKING PREMIER PROTEIN SHAKES  DON'T ALLOW YOURSELF TO [place] THROWS YOU A CURVE

## 2022-10-17 LAB — SPECIMEN STATUS REPORT

## 2022-10-17 MED ORDER — METOPROLOL TARTRATE 50 MG PO TABS: 75.0000 mg | ORAL_TABLET | Freq: Two times a day (BID) | ORAL | 0 refills | Status: DC

## 2022-10-17 NOTE — Assessment & Plan Note (Signed)
Screening labs today for history of  iatrogenic malabsorption ordered

## 2022-10-17 NOTE — Assessment & Plan Note (Addendum)
Continued Weight regain addressed. With 50 lb weight regain thus far . He is making excuses for not exercising and not eating a healthy diet despite living with his parents,  who  are committed to helping him but becoming frustrated by  his lack of commitment

## 2022-10-17 NOTE — Assessment & Plan Note (Signed)
Aggravated by his job loss. Encouraged to find a therapist. He was given a list of therapists to call at last visit but has not done so.  Continue paxil for anxiety and wellbutrin for depressive symptoms

## 2022-10-18 ENCOUNTER — Telehealth: Payer: Self-pay | Admitting: Internal Medicine

## 2022-10-18 LAB — FOLATE, RBC AND SERUM: Folate: 2.3 ng/mL — ABNORMAL LOW (ref >3.0)

## 2022-10-18 LAB — ZINC: Zinc: 67 ug/dL (ref 60–130)

## 2022-10-18 LAB — VITAMIN E
Gamma-Tocopherol (Vit E): <1 mg/L (ref <=4.3)
Vitamin E (Alpha Tocopherol): 9.8 mg/L (ref 5.7–19.9)

## 2022-10-18 LAB — VITAMIN A: Vitamin A (Retinoic Acid): 30 ug/dL — ABNORMAL LOW (ref 38–98)

## 2022-10-18 LAB — T4 AND TSH
T4, Total: 8.9 ug/dL (ref 4.5–12.0)
TSH: 2.72 u[IU]/mL (ref 0.450–4.500)

## 2022-10-18 NOTE — Telephone Encounter (Signed)
Patient needs to come back, [employer] stated they needed more blood for the RBC Folate. I called and left a voice message on Patient answering machine.

## 2022-10-18 NOTE — Telephone Encounter (Signed)
LMTCB

## 2022-10-21 ENCOUNTER — Encounter: Payer: Self-pay | Admitting: Internal Medicine

## 2022-10-21 MED ORDER — METOPROLOL TARTRATE 50 MG PO TABS: 75.0000 mg | ORAL_TABLET | Freq: Two times a day (BID) | ORAL | 0 refills | Status: DC

## 2022-10-21 MED ORDER — BUPROPION HCL ER (XL) 300 MG PO TB24: 300.0000 mg | ORAL_TABLET | Freq: Every day | ORAL | 1 refills | Status: DC

## 2022-10-21 MED ORDER — PAROXETINE HCL ER 25 MG PO TB24: ORAL_TABLET | ORAL | 1 refills | Status: DC

## 2022-10-21 MED ORDER — LOSARTAN POTASSIUM 50 MG PO TABS: 50.0000 mg | ORAL_TABLET | Freq: Every day | ORAL | 1 refills | Status: DC

## 2022-10-21 MED ORDER — TRAZODONE HCL 50 MG PO TABS: 25.0000 mg | ORAL_TABLET | Freq: Every evening | ORAL | 1 refills | Status: DC | PRN

## 2022-10-21 NOTE — Telephone Encounter (Signed)
Sent pt a mychart message. 

## 2022-10-23 LAB — LIPID PANEL W/REFLEX DIRECT LDL
Cholesterol: 112 mg/dL (ref <200)
HDL: 28 mg/dL — ABNORMAL LOW (ref >=40)
LDL Cholesterol (Calc): 67 mg/dL (calc)
Non-HDL Cholesterol (Calc): 84 mg/dL (calc) (ref <130)
Total CHOL/HDL Ratio: 4 (calc) (ref <5.0)
Triglycerides: 87 mg/dL (ref <150)

## 2022-10-23 LAB — VITAMIN K1, SERUM: Vitamin K: 75 pg/mL — ABNORMAL LOW (ref 130–1500)

## 2022-10-26 ENCOUNTER — Encounter: Payer: Self-pay | Admitting: Internal Medicine

## 2022-10-29 MED ORDER — ADVANCED MULTI EA PO CHEW: 2.0000 | CHEWABLE_TABLET | Freq: Every day | ORAL | 2 refills | Status: DC

## 2022-11-17 ENCOUNTER — Encounter: Payer: Self-pay | Admitting: Internal Medicine

## 2022-11-18 NOTE — Telephone Encounter (Signed)
PA Paxil

## 2022-11-25 ENCOUNTER — Other Ambulatory Visit (HOSPITAL_COMMUNITY): Payer: Self-pay

## 2022-11-25 ENCOUNTER — Telehealth: Payer: Self-pay

## 2022-11-25 NOTE — Telephone Encounter (Signed)
Pharmacy Patient Advocate Encounter   Received notification that prior authorization for PARoxetine HCl ER '25MG'$  er tablets is required/requested.  Per Test Claim: Plan/benefit exclusion   PA submitted on 11/25/22 to (ins) [employer] via CoverMyMeds Key or (Medicaid) confirmation #  [id number] Status is pending

## 2022-11-26 ENCOUNTER — Other Ambulatory Visit (HOSPITAL_COMMUNITY): Payer: Self-pay

## 2022-11-26 NOTE — Telephone Encounter (Signed)
Patient Advocate Encounter  Prior Authorization for PARoxetine HCl ER '25MG'$  er tablets has been approved.    PA# [id number] Effective dates: 11/25/22 through 11/25/23

## 2022-12-15 ENCOUNTER — Emergency Department
Admission: EM | Admit: 2022-12-15 | Discharge: 2022-12-15 | Disposition: A | Discharge status: Home / Self Care | Payer: Medicaid Other | Attending: Emergency Medicine | Admitting: Emergency Medicine

## 2022-12-15 ENCOUNTER — Emergency Department: Payer: Medicaid Other

## 2022-12-15 DIAGNOSIS — R002 Palpitations: Secondary | ICD-10-CM | POA: Diagnosis not present

## 2022-12-15 DIAGNOSIS — I4891 Unspecified atrial fibrillation: Secondary | ICD-10-CM | POA: Diagnosis not present

## 2022-12-15 DIAGNOSIS — R059 Cough, unspecified: Secondary | ICD-10-CM | POA: Diagnosis not present

## 2022-12-15 LAB — CBC
HCT: 47.4 % (ref 39.0–52.0)
Hemoglobin: 16.1 g/dL (ref 13.0–17.0)
MCH: 30.6 pg (ref 26.0–34.0)
MCHC: 34 g/dL (ref 30.0–36.0)
MCV: 89.9 fL (ref 80.0–100.0)
Platelets: 249 10*3/uL (ref 150–400)
RBC: 5.27 MIL/uL (ref 4.22–5.81)
RDW: 13.2 % (ref 11.5–15.5)
WBC: 8 10*3/uL (ref 4.0–10.5)
nRBC: 0 % (ref 0.0–0.2)

## 2022-12-15 LAB — BASIC METABOLIC PANEL
Anion gap: 7 (ref 5–15)
BUN: 16 mg/dL (ref 6–20)
CO2: 26 mmol/L (ref 22–32)
Calcium: 8.2 mg/dL — ABNORMAL LOW (ref 8.9–10.3)
Chloride: 106 mmol/L (ref 98–111)
Creatinine, Ser: 0.89 mg/dL (ref 0.61–1.24)
GFR, Estimated: >60 mL/min (ref >60)
Glucose, Bld: 93 mg/dL (ref 70–99)
Potassium: 3.3 mmol/L — ABNORMAL LOW (ref 3.5–5.1)
Sodium: 139 mmol/L (ref 135–145)

## 2022-12-15 LAB — TROPONIN I (HIGH SENSITIVITY): Troponin I (High Sensitivity): 4 ng/L (ref <18)

## 2022-12-15 MED ORDER — SODIUM CHLORIDE 0.9 % IV BOLUS
1000.0000 mL | Freq: Once | INTRAVENOUS | Status: AC
  Administered 2022-12-15: 1000 mL via INTRAVENOUS

## 2022-12-15 MED ORDER — METOPROLOL TARTRATE 25 MG PO TABS
25.0000 mg | ORAL_TABLET | Freq: Once | ORAL | Status: AC
  Administered 2022-12-15: 25 mg via ORAL
  Filled 2022-12-15: qty 1

## 2022-12-15 MED ORDER — METOPROLOL TARTRATE 50 MG PO TABS: 100.0000 mg | ORAL_TABLET | Freq: Two times a day (BID) | ORAL | 0 refills | Status: DC

## 2022-12-15 MED ORDER — DILTIAZEM HCL 25 MG/5ML IV SOLN
10.0000 mg | Freq: Once | INTRAVENOUS | Status: AC
  Administered 2022-12-15: 10 mg via INTRAVENOUS
  Filled 2022-12-15: qty 5

## 2022-12-15 MED ORDER — APIXABAN 5 MG PO TABS: 5.0000 mg | ORAL_TABLET | Freq: Two times a day (BID) | ORAL | 0 refills | Status: DC

## 2022-12-15 MED ORDER — APIXABAN (ELIQUIS) VTE STARTER PACK (10MG AND 5MG): ORAL_TABLET | ORAL | 0 refills | Status: DC

## 2022-12-15 NOTE — Discharge Instructions (Signed)
Please please increase your metoprolol dosing to 100 mg twice a day until your atrial fibrillation resolves or until follow-up with your cardiologist.

## 2022-12-15 NOTE — ED Provider Notes (Signed)
[facility] Provider Note   Event Date/Time   First MD Initiated Contact with Patient 12/15/22 0701     (approximate) History  Palpitations  HPI Larry Rogers is a 27 y.o. male with stated past medical history of paroxysmal atrial fibrillation who presents with lightheadedness and palpitations concerning for atrial fibrillation again.  Patient states that his Apple Watch has been reading his rhythm and states that he was in atrial fibrillation starting this morning.  Patient states that he takes 75 mg metoprolol twice a day and has taken it this morning but denies any blood thinner use. ROS: Patient currently denies any vision changes, tinnitus, difficulty speaking, facial droop, sore throat, chest pain, shortness of breath, abdominal pain, nausea/vomiting/diarrhea, dysuria, or weakness/numbness/paresthesias in any extremity   Physical Exam  Triage Vital Signs: ED Triage Vitals  Enc Vitals Group     BP 12/15/22 0653 (!) 133/99     Pulse --      Resp 12/15/22 0653 20     Temp 12/15/22 0653 97.9 F (36.6 C)     Temp Source 12/15/22 0653 Oral     SpO2 12/15/22 0653 97 %     Weight 12/15/22 0652 (!) 350 lb (158.8 kg)     Height 12/15/22 0652 6\' 3"  (1.905 m)     Head Circumference --      Peak Flow --      Pain Score --      Pain Loc --      Pain Edu? --      Excl. in [place]? --    Most recent vital signs: Vitals:   12/15/22 0653  BP: (!) 133/99  Resp: 20  Temp: 97.9 F (36.6 C)  SpO2: 97%   General: Awake, oriented x4. CV:  Good peripheral perfusion.  Resp:  Normal effort.  Abd:  No distention.  Other:  Middle-aged obese Caucasian male laying in bed in no acute distress ED Results / Procedures / Treatments  Labs (all labs ordered are listed, but only abnormal results are displayed) Labs Reviewed  BASIC METABOLIC PANEL - Abnormal; Notable for the following components:      Result Value   Potassium 3.3 (*)    Calcium 8.2 (*)    All other  components within normal limits  CBC  TROPONIN I (HIGH SENSITIVITY)   EKG ED ECG REPORT I, Naaman Plummer, the attending physician, personally viewed and interpreted this ECG. Date: 12/15/2022 EKG Time: 0654 Rate: 136 Rhythm: Atrial fibrillation with rapid ventricular response QRS Axis: normal Intervals: normal ST/T Wave abnormalities: normal Narrative Interpretation: Atrial fibrillation with rapid ventricular response.  No evidence of acute ischemia RADIOLOGY ED MD interpretation: 2 view chest x-ray interpreted by me shows no evidence of acute abnormalities including no pneumonia, pneumothorax, or widened mediastinum -Agree with radiology assessment Official radiology report(s): DG Chest 2 View  Result Date: 12/15/2022 CLINICAL DATA:  27 year old male with history of palpitations. EXAM: CHEST - 2 VIEW COMPARISON:  Chest x-ray 01/02/2021. FINDINGS: Lung volumes are normal. No consolidative airspace disease. No pleural effusions. No pneumothorax. No pulmonary nodule or mass noted. Pulmonary vasculature and the cardiomediastinal silhouette are within normal limits. IMPRESSION: No radiographic evidence of acute cardiopulmonary disease. Electronically Signed   By: Vinnie Langton M.D.   On: 12/15/2022 07:42   PROCEDURES: Critical Care performed: No .1-3 Lead EKG Interpretation  Performed by: Naaman Plummer, MD Authorized by: Naaman Plummer, MD     Interpretation: abnormal  ECG rate:  92   ECG rate assessment: normal     Rhythm: atrial fibrillation     Ectopy: none     Conduction: normal    MEDICATIONS ORDERED IN ED: Medications  metoprolol tartrate (LOPRESSOR) tablet 25 mg (has no administration in time range)  diltiazem (CARDIZEM) injection 10 mg (10 mg Intravenous Given 12/15/22 0726)  sodium chloride 0.9 % bolus 1,000 mL (1,000 mLs Intravenous New Bag/Given 12/15/22 0726)   IMPRESSION / MDM / ASSESSMENT AND PLAN / ED COURSE  I reviewed the triage vital signs and the  nursing notes.                             The patient is on the cardiac monitor to evaluate for evidence of arrhythmia and/or significant heart rate changes. Patient's presentation is most consistent with acute presentation with potential threat to life or bodily function. + atrial fibrillation w/ RVR DDx: Pneumothorax, Pneumonia, Pulmonary Embolus, Tamponade, ACS, Thyrotoxicosis.  No history or evidence decompensated heart failure. Given their history and exam it is likely this patient is unlikely to spontaneously revert to a rate controlled rhythm and necessitates a thorough workup for their arrhythmia. Workup: ECG, CXR, CBC, BMP, UA, Troponin, BNP, TSH, Ca-Mag-Phos Interventions: Defer Cardioversion (uncertain historical reliability with time of onset, increased risk of thromboembolic stroke).  Patient's rate well-controlled after diltiazem bolus.  P.o. 25 mg dose of metoprolol given Exter this morning with instructions to take 100 mg twice daily until A-fib resolves or until follow-up with cardiologist.  Disposition: Charged home with cardiology follow-up   FINAL CLINICAL IMPRESSION(S) / ED DIAGNOSES   Final diagnoses:  Atrial fibrillation with rapid ventricular response ([place])  Palpitations   Rx / DC Orders   ED Discharge Orders          Ordered    Ambulatory referral to Cardiology       Comments: If you have not heard from the Cardiology office within the next 72 hours please call [phone].   12/15/22 0803    APIXABAN (ELIQUIS) VTE STARTER PACK (10MG  AND 5MG )        12/15/22 0805    metoprolol tartrate (LOPRESSOR) 50 MG tablet  2 times daily        12/15/22 0805           Note:  This document was prepared using Dragon voice recognition software and may include unintentional dictation errors.   Naaman Plummer, MD 12/15/22 [phone]

## 2022-12-15 NOTE — ED Notes (Signed)
5 yom with a c/c of chest pressure.The pt advised his phone woke him up this morning and showed his heart rate was elevated. The pt advised he does have a hx of HTN and takes metoprolol. The pt is warm, pink, and dry.The pt is alert and oriented x 4. The pt's wife at bedside.

## 2022-12-15 NOTE — ED Triage Notes (Signed)
Pt states his watch woke him this morning reporting afib with rate 130-190. 1 episode of this prior and was treated at that time. Pt ambulatory to triage. Breathing unlabored speaking in full sentences. Pt states takes 75 mg metoprolol 2x/day. Pt does not take daily blood thinners. Denies chest pain but describes discomfort in chest.

## 2022-12-24 ENCOUNTER — Other Ambulatory Visit: Payer: Self-pay | Admitting: Internal Medicine

## 2022-12-24 DIAGNOSIS — I480 Paroxysmal atrial fibrillation: Secondary | ICD-10-CM

## 2022-12-24 MED ORDER — BUPROPION HCL ER (XL) 150 MG PO TB24: 150.0000 mg | ORAL_TABLET | Freq: Every day | ORAL | 1 refills | Status: DC

## 2022-12-24 MED ORDER — METOPROLOL TARTRATE 100 MG PO TABS: 100.0000 mg | ORAL_TABLET | Freq: Two times a day (BID) | ORAL | 2 refills | Status: DC

## 2022-12-24 NOTE — Assessment & Plan Note (Signed)
During his mother's visit on April 9.  She informed me that Larry Rogers was treated in ER on march 22 for rapid atrial fibrillation.  Metoprolol dose increased to 100 mg tiwice daily and Eliquis started .  He has follow up  with cardiology NP on April 15 .  Pulse has been erratic since ER discharge but usually < 100.  I have reduced his dose of wellbutrin to 150 mg as this may be contributing

## 2022-12-30 ENCOUNTER — Other Ambulatory Visit: Payer: Self-pay

## 2022-12-30 ENCOUNTER — Ambulatory Visit: Payer: Medicaid Other | Attending: Physician Assistant | Admitting: Nurse Practitioner

## 2022-12-30 ENCOUNTER — Encounter: Payer: Self-pay | Admitting: Nurse Practitioner

## 2022-12-30 VITALS — BP 112/70 | HR 66 | Ht 75.0 in | Wt 297.4 lb

## 2022-12-30 DIAGNOSIS — I10 Essential (primary) hypertension: Secondary | ICD-10-CM

## 2022-12-30 DIAGNOSIS — I480 Paroxysmal atrial fibrillation: Secondary | ICD-10-CM

## 2022-12-30 DIAGNOSIS — G4733 Obstructive sleep apnea (adult) (pediatric): Secondary | ICD-10-CM

## 2022-12-30 MED ORDER — APIXABAN 5 MG PO TABS: 5.0000 mg | ORAL_TABLET | Freq: Two times a day (BID) | ORAL | 3 refills | Status: DC

## 2022-12-30 NOTE — Progress Notes (Signed)
Office Visit    Patient Name: Larry Rogers Date of Encounter: 12/30/2022  Primary Care Provider:  Sherlene Shams, MD Primary Cardiologist:  None  Chief Complaint    27 year old male with a history of paroxysmal atrial fibrillation, hypertension, OSA, and obesity who presents for hospital follow-up related to atrial fibrillation.  Past Medical History    Past Medical History:  Diagnosis Date   Acne     Skin   Anxiety    Anxiety    Asthma    seasonal allergies, Dx age 69   Depression    Fatty liver    GERD (gastroesophageal reflux disease)    Headache    Hypertension 2012   Dr. Kathrene Alu   Past Surgical History:  Procedure Laterality Date   ADENOIDECTOMY     BILIOPANCREATIC DIVISION W DUODENAL SWITCH  01/2019   ESOPHAGOGASTRODUODENOSCOPY (EGD) WITH PROPOFOL N/A 03/16/2015   Procedure: ESOPHAGOGASTRODUODENOSCOPY (EGD) WITH PROPOFOL;  Surgeon: Louis Meckel, MD;  Location: [facility];  Service: Endoscopy;  Laterality: N/A;   TONSILLECTOMY      Allergies  No Known Allergies   Labs/Other Studies Reviewed    The following studies were reviewed today: Echo 01/2021: IMPRESSIONS     1. Left ventricular ejection fraction, by estimation, is 60 to 65%. The  left ventricle has normal function. The left ventricle has no regional  wall motion abnormalities. Left ventricular diastolic parameters were  normal.   2. Right ventricular systolic function is normal. The right ventricular  size is normal. Tricuspid regurgitation signal is inadequate for assessing  PA pressure.   3. Left atrial size was moderately dilated.    Recent Labs: 10/16/2022: Magnesium 1.9; TSH 2.720 12/15/2022: BUN 16; Creatinine, Ser 0.89; Hemoglobin 16.1; Platelets 249; Potassium 3.3; Sodium 139  Recent Lipid Panel    Component Value Date/Time   CHOL 112 10/16/2022 0845   CHOL 117 11/26/2021 1002   TRIG 87 10/16/2022 0845   HDL 28 (L) 10/16/2022 0845   HDL 35 (L) 11/26/2021 1002    CHOLHDL 4.0 10/16/2022 0845   VLDL 25.6 03/30/2018 0913   LDLCALC 67 10/16/2022 0845    History of Present Illness    27 year old male with the above past medical history including paroxysmal atrial fibrillation, hypertension, OSA, and obesity.  Echocardiogram in 01/2021 showed EF 60 to 65%, normal LV function, no RWMA, normal RV systolic function.  He was originally scheduled for a cardioversion, however, he spontaneously converted to sinus rhythm prior to procedure.  He was last seen in the office on 03/28/2021 by Dr. Lalla Brothers, EP.  He was maintaining sinus rhythm at the time.  Eliquis was discontinued.  He presented to the ED on 12/15/2022 with palpitations, lightheadedness.  EKG showed atrial fibrillation with RVR.  Potassium was low at 3.3.  Troponin was negative x 1.  Chest x-ray was unremarkable.  He was started on Eliquis and metoprolol and was advised to follow-up with cardiology as an outpatient.  He presents today for follow-up. Since his recent ED visit he has done well from a cardiac standpoint.  He is no longer in atrial fibrillation.  He denies any recurrent palpitations, lightheadedness, dyspnea, edema, PND, orthopnea, weight gain.  He has continue to work on late weight loss efforts.  Overall, he reports feeling well.  Home Medications    Current Outpatient Medications  Medication Sig Dispense Refill   apixaban (ELIQUIS) 5 MG TABS tablet Take 1 tablet (5 mg total) by mouth 2 (two) times daily  for 10 days. 20 tablet 0   bariatric multiple vitamin (ADVANCED MULTI EA) CHEW chewable tablet Chew 2 tablets by mouth daily. 180 tablet 2   buPROPion (WELLBUTRIN XL) 150 MG 24 hr tablet Take 1 tablet (150 mg total) by mouth daily. 90 tablet 1   Cholecalciferol (VITAMIN D3) 5000 units CAPS Take 1 capsule by mouth daily.     losartan (COZAAR) 50 MG tablet Take 1 tablet (50 mg total) by mouth at bedtime. 90 tablet 1   metoprolol tartrate (LOPRESSOR) 100 MG tablet Take 1 tablet (100 mg total)  by mouth 2 (two) times daily. 60 tablet 2   montelukast (SINGULAIR) 10 MG tablet Take 1 tablet (10 mg total) by mouth at bedtime. 90 tablet 4   PARoxetine (PAXIL-CR) 25 MG 24 hr tablet TAKE 1 TABLET(25 MG) BY MOUTH DAILY 90 tablet 1   traZODone (DESYREL) 50 MG tablet Take 0.5-1 tablets (25-50 mg total) by mouth at bedtime as needed for sleep. 90 tablet 1   No current facility-administered medications for this visit.     Review of Systems    He denies chest pain, palpitations, dyspnea, pnd, orthopnea, n, v, dizziness, syncope, edema, weight gain, or early satiety. All other systems reviewed and are otherwise negative except as noted above.   Physical Exam    VS:  BP 112/70 (BP Location: Left Arm, Patient Position: Sitting, Cuff Size: Large)   Pulse 66   Ht 6\' 3"  (1.905 m)   Wt 297 lb 6.4 oz (134.9 kg)   SpO2 95%   BMI 37.17 kg/m   GEN: Well nourished, well developed, in no acute distress. HEENT: normal. Neck: Supple, no JVD, carotid bruits, or masses. Cardiac: RRR, no murmurs, rubs, or gallops. No clubbing, cyanosis, edema.  Radials/DP/PT 2+ and equal bilaterally.  Respiratory:  Respirations regular and unlabored, clear to auscultation bilaterally. GI: Soft, nontender, nondistended, BS + x 4. MS: no deformity or atrophy. Skin: warm and dry, no rash. Neuro:  Strength and sensation are intact. Psych: Normal affect.  Accessory Clinical Findings    ECG personally reviewed by me today - NSR, 66 bpm - no acute changes.   Lab Results  Component Value Date   WBC 8.0 12/15/2022   HGB 16.1 12/15/2022   HCT 47.4 12/15/2022   MCV 89.9 12/15/2022   PLT 249 12/15/2022   Lab Results  Component Value Date   CREATININE 0.89 12/15/2022   BUN 16 12/15/2022   NA 139 12/15/2022   K 3.3 (L) 12/15/2022   CL 106 12/15/2022   CO2 26 12/15/2022   Lab Results  Component Value Date   ALT 32 11/26/2021   AST 27 11/26/2021   ALKPHOS 102 11/26/2021   BILITOT 0.4 11/26/2021   Lab Results   Component Value Date   CHOL 112 10/16/2022   HDL 28 (L) 10/16/2022   LDLCALC 67 10/16/2022   TRIG 87 10/16/2022   CHOLHDL 4.0 10/16/2022    Lab Results  Component Value Date   HGBA1C 4.8 10/16/2022    Assessment & Plan    1. PAF: Recent a fib visit with a fib with RVR. Maintaining NSR.  Potassium was low during recent ED visit, CBC was stable.  Recent TSH and Mg were unremarkable.  Will repeat BMET. Continue metoprolol, Eliquis.  Recommend follow-up with EP.   2. Hypertension: BP well controlled. Continue current antihypertensive regimen.   3. OSA: Adherent to CPAP.   4. Obesity: Encouraged ongoing lifestyle modifications with diet and exercise.  5. Disposition: Follow-up in 2 to 3 months with Dr. Lalla Brothers.     Joylene Grapes, NP 12/30/2022, 4:01 PM

## 2022-12-30 NOTE — Patient Instructions (Addendum)
Medication Instructions:  Your physician recommends that you continue on your current medications as directed. Please refer to the Current Medication list given to you today.  *If you need a refill on your cardiac medications before your next appointment, please call your pharmacy*   Lab Work: Your physician recommends that you return for lab work in 1 week. BMET    If you have labs (blood work) drawn today and your tests are completely normal, you will receive your results only by: MyChart Message (if you have MyChart) OR A paper copy in the mail If you have any lab test that is abnormal or we need to change your treatment, we will call you to review the results.   Testing/Procedures: NONE ordered at this time of appointment     Follow-Up: At [facility], you and your health needs are our priority.  As part of our continuing mission to provide you with exceptional heart care, we have created designated Provider Care Teams.  These Care Teams include your primary Cardiologist (physician) and Advanced Practice Providers (APPs -  Physician Assistants and Nurse Practitioners) who all work together to provide you with the care you need, when you need it.  We recommend signing up for the patient portal called "MyChart".  Sign up information is provided on this After Visit Summary.  MyChart is used to connect with patients for Virtual Visits (Telemedicine).  Patients are able to view lab/test results, encounter notes, upcoming appointments, etc.  Non-urgent messages can be sent to your provider as well.   To learn more about what you can do with MyChart, go to [email or web address].    Your next appointment:    First available   Provider:   Dr. Lalla Brothers     Other Instructions

## 2023-01-10 ENCOUNTER — Ambulatory Visit: Payer: Medicaid Other | Admitting: Physician Assistant

## 2023-02-13 ENCOUNTER — Ambulatory Visit
Admission: RE | Admit: 2023-02-13 | Discharge: 2023-02-13 | Disposition: A | Discharge status: Home / Self Care | Payer: Medicaid Other | Source: Ambulatory Visit | Attending: Emergency Medicine | Admitting: Emergency Medicine

## 2023-02-13 ENCOUNTER — Ambulatory Visit (INDEPENDENT_AMBULATORY_CARE_PROVIDER_SITE_OTHER): Payer: Medicaid Other

## 2023-02-13 VITALS — BP 117/80 | HR 62 | Temp 98.1°F | Resp 16

## 2023-02-13 DIAGNOSIS — R1033 Periumbilical pain: Secondary | ICD-10-CM | POA: Diagnosis not present

## 2023-02-13 DIAGNOSIS — R109 Unspecified abdominal pain: Secondary | ICD-10-CM | POA: Diagnosis not present

## 2023-02-13 NOTE — Discharge Instructions (Signed)
Your x-ray was not concerning for any acute bowel obstruction or infectious cause of your pain.  I think the more likely explanation is that you seem to have a lot of gas in your colon.  I recommend that you consider using mag citrate to produce a large bowel movement which will hopefully relieve what may very likely be gas pain.  Certainly, if this does not help, the next up would be to get 3D imaging of your abdomen such as a CT scan.  Please either reach out to your primary care provider or, if your pain is acute, go to the emergency room for further evaluation.

## 2023-02-13 NOTE — ED Triage Notes (Signed)
Pt c/o abd pain x4 days. Pain is intermittent, sharp in nature. Periumbilical. Non-radiating. No relief with Tums, Pepto, and Gas-x. Pt endorses diarrhea. No n/v.

## 2023-02-13 NOTE — ED Provider Notes (Signed)
[facility]    CSN: [id number] Arrival date & time: 02/13/23  1200    HISTORY   Chief Complaint  Patient presents with   Abdominal Pain    Sharp pain. Diarrhea. Bloating. - Entered by patient   HPI Larry Rogers is a pleasant, 27 y.o. male who presents to urgent care today. Pt c/o intermittent episodes of abd pain x4 days, states they occur every 15-20 minutes and resolve w/o intervention. Pain is sharp in quality, does not take it breath away. States pain is mostly periumbilical but varies slightly in location, does not radiate.  Pt states he has tried Tums, Pepto, and Gas-x without relief. Pt endorses a few episodes of diarrhea w/o n/v, states this does not resolve pain.  Denies associate with meals.   The history is provided by the patient.   Past Medical History:  Diagnosis Date   Acne    [place] [facility]   Anxiety    Anxiety    Asthma    seasonal allergies, Dx age 48   Depression    Fatty liver    GERD (gastroesophageal reflux disease)    Headache    Hypertension 2012   Dr. Kathrene Alu   Patient Active Problem List   Diagnosis Date Noted   Achilles tendon disorder, right 07/15/2022   Allergic rhinitis 12/18/2021   S/P bariatric surgery 08/08/2019   Bilateral lower extremity edema 12/17/2016   Hepatic steatosis 12/17/2016   Snoring 07/04/2015   Atrial fibrillation (HCC) 06/14/2015   Thyroid dysfunction 06/14/2015   Depression with anxiety 02/14/2015   Seasonal allergies 12/20/2014   Gastroesophageal reflux disease without esophagitis 08/15/2014   Essential hypertension, benign 02/25/2013   Morbid obesity (HCC) 02/25/2013   Past Surgical History:  Procedure Laterality Date   ADENOIDECTOMY     BILIOPANCREATIC DIVISION W DUODENAL SWITCH  01/2019   ESOPHAGOGASTRODUODENOSCOPY (EGD) WITH PROPOFOL N/A 03/16/2015   Procedure: ESOPHAGOGASTRODUODENOSCOPY (EGD) WITH PROPOFOL;  Surgeon: Louis Meckel, MD;  Location: [facility];  Service: Endoscopy;   Laterality: N/A;   TONSILLECTOMY      Home Medications    Prior to Admission medications   Medication Sig Start Date End Date Taking? Authorizing Provider  apixaban (ELIQUIS) 5 MG TABS tablet Take 1 tablet (5 mg total) by mouth 2 (two) times daily. 12/30/22  Yes Monge, Petra Kuba, NP  bariatric multiple vitamin (ADVANCED MULTI EA) CHEW chewable tablet Chew 2 tablets by mouth daily. 10/29/22  Yes Sherlene Shams, MD  buPROPion (WELLBUTRIN XL) 150 MG 24 hr tablet Take 1 tablet (150 mg total) by mouth daily. 12/24/22  Yes Sherlene Shams, MD  Cholecalciferol (VITAMIN D3) 5000 units CAPS Take 1 capsule by mouth daily.   Yes [provider]  losartan (COZAAR) 50 MG tablet Take 1 tablet (50 mg total) by mouth at bedtime. 10/21/22  Yes Sherlene Shams, MD  metoprolol tartrate (LOPRESSOR) 100 MG tablet Take 1 tablet (100 mg total) by mouth 2 (two) times daily. 12/24/22 12/24/23 Yes Sherlene Shams, MD  montelukast (SINGULAIR) 10 MG tablet Take 1 tablet (10 mg total) by mouth at bedtime. 11/26/21  Yes Sherlene Shams, MD  PARoxetine (PAXIL-CR) 25 MG 24 hr tablet TAKE 1 TABLET(25 MG) BY MOUTH DAILY 10/21/22  Yes Sherlene Shams, MD  traZODone (DESYREL) 50 MG tablet Take 0.5-1 tablets (25-50 mg total) by mouth at bedtime as needed for sleep. 10/21/22  Yes Sherlene Shams, MD    Family History Family History  Problem  Relation Age of Onset   Hypertension Father    Heart disease Paternal Grandfather    Hypertension Paternal Grandfather    Aortic aneurysm Paternal Grandfather    Diabetes Mother    Diabetes Maternal Uncle        x2   Colon cancer Neg Hx    Colon polyps Neg Hx    Esophageal cancer Neg Hx    Kidney disease Neg Hx    Gallbladder disease Neg Hx    Social History Social History   Tobacco Use   Smoking status: Never   Smokeless tobacco: Never  Vaping Use   Vaping Use: Never used  Substance Use Topics   Alcohol use: No    Alcohol/week: 0.0 standard drinks of alcohol   Drug use: No    Allergies   Patient has no known allergies.  Review of Systems Review of Systems Pertinent findings revealed after performing a 14 point review of systems has been noted in the history of present illness.  Physical Exam Vital Signs BP 117/80 (BP Location: Right Arm)   Pulse 62   Temp 98.1 F (36.7 C) (Oral)   Resp 16   SpO2 96%   No data found.  Physical Exam Vitals and nursing note reviewed.  Constitutional:      General: He is awake. He is not in acute distress.    Appearance: Normal appearance. He is well-developed and well-groomed. He is morbidly obese. He is not ill-appearing.  HENT:     Head: Normocephalic and atraumatic.  Eyes:     Extraocular Movements: Extraocular movements intact.     Conjunctiva/sclera: Conjunctivae normal.     Pupils: Pupils are equal, round, and reactive to light.  Cardiovascular:     Rate and Rhythm: Normal rate and regular rhythm.  Pulmonary:     Effort: Pulmonary effort is normal.     Breath sounds: Normal breath sounds.  Abdominal:     General: Abdomen is protuberant. Bowel sounds are normal. There is no distension or abdominal bruit.     Palpations: Abdomen is soft.     Tenderness: There is abdominal tenderness in the periumbilical area. There is no right CVA tenderness or left CVA tenderness.  Musculoskeletal:        General: Normal range of motion.     Cervical back: Normal range of motion and neck supple.  Skin:    General: Skin is warm and dry.  Neurological:     General: No focal deficit present.     Mental Status: He is alert and oriented to person, place, and time. Mental status is at baseline.  Psychiatric:        Mood and Affect: Mood normal.        Behavior: Behavior normal. Behavior is cooperative.        Thought Content: Thought content normal.        Judgment: Judgment normal.     Visual Acuity Right Eye Distance:   Left Eye Distance:   Bilateral Distance:    Right Eye Near:   Left Eye Near:    Bilateral  Near:     UC Couse / Diagnostics / Procedures:     Radiology DG Abd 1 View  Result Date: 02/13/2023 CLINICAL DATA:  Intermittent periumbilical pain. EXAM: ABDOMEN - 1 VIEW COMPARISON:  CT abdomen/pelvis 09/13/2016 FINDINGS: There is a nonobstructive bowel gas pattern. There is no evidence of free intraperitoneal air. There is no gross organomegaly or abnormal soft tissue calcification. Surgical clips  are noted to the left of midline in the upper abdomen. There is no acute osseous abnormality. The imaged lung bases are clear. IMPRESSION: No acute finding in the abdomen. Electronically Signed   By: Lesia Hausen M.D.   On: 02/13/2023 13:12    Procedures Procedures (including critical care time) EKG  Pending results:  Labs Reviewed - No data to display  Medications Ordered in UC: Medications - No data to display  UC Diagnoses / Final Clinical Impressions(s)   I have reviewed the triage vital signs and the nursing notes.  Pertinent labs & imaging results that were available during my care of the patient were reviewed by me and considered in my medical decision making (see chart for details).    Final diagnoses:  Periumbilical abdominal pain   Patient advised of x-ray findings.  Patient states he is in no acute distress and states the pain is not debilitating or interfering with his regular activities.  Patient states he really just came here because he has a history of gastric bypass and wanted to make sure there is nothing serious going on.  Patient reassured.    Patient advised to consider using his mag citrate to clear his bowels to see if this relieves what is potentially gas pain based on my personal interpretation of his abdominal film.  Patient agreed to get [place] to try before going to the emergency room for further evaluation or more advanced imaging.  Please see discharge instructions below for details of plan of care as provided to patient. ED Prescriptions   None    PDMP not  reviewed this encounter.  Pending results:  Labs Reviewed - No data to display  Discharge Instructions:   Discharge Instructions      Your x-ray was not concerning for any acute bowel obstruction or infectious cause of your pain.  I think the more likely explanation is that you seem to have a lot of gas in your colon.  I recommend that you consider using mag citrate to produce a large bowel movement which will hopefully relieve what may very likely be gas pain.  Certainly, if this does not help, the next up would be to get 3D imaging of your abdomen such as a CT scan.  Please either reach out to your primary care provider or, if your pain is acute, go to the emergency room for further evaluation.      Disposition Upon Discharge:  Condition: stable for discharge home  Patient presented with an acute illness with associated systemic symptoms and significant discomfort requiring urgent management. In my opinion, this is a condition that a prudent lay person (someone who possesses an average knowledge of health and medicine) may potentially expect to result in complications if not addressed urgently such as respiratory distress, impairment of bodily function or dysfunction of bodily organs.   Routine symptom specific, illness specific and/or disease specific instructions were discussed with the patient and/or caregiver at length.   As such, the patient has been evaluated and assessed, work-up was performed and treatment was provided in alignment with urgent care protocols and evidence based medicine.  Patient/parent/caregiver has been advised that the patient may require follow up for further testing and treatment if the symptoms continue in spite of treatment, as clinically indicated and appropriate.  Patient/parent/caregiver has been advised to return to the [facility] or PCP if no better; to PCP or the Emergency Department if new signs and symptoms develop, or if the current signs or  symptoms  continue to change or worsen for further workup, evaluation and treatment as clinically indicated and appropriate  The patient will follow up with their current PCP if and as advised. If the patient does not currently have a PCP we will assist them in obtaining one.   The patient may need specialty follow up if the symptoms continue, in spite of conservative treatment and management, for further workup, evaluation, consultation and treatment as clinically indicated and appropriate.  Patient/parent/caregiver verbalized understanding and agreement of plan as discussed.  All questions were addressed during visit.  Please see discharge instructions below for further details of plan.  This office note has been dictated using Teaching laboratory technician.  Unfortunately, this method of dictation can sometimes lead to typographical or grammatical errors.  I apologize for your inconvenience in advance if this occurs.  Please do not hesitate to reach out to me if clarification is needed.      Theadora Rama Scales, PA-C 02/13/23 1321

## 2023-02-25 ENCOUNTER — Ambulatory Visit (INDEPENDENT_AMBULATORY_CARE_PROVIDER_SITE_OTHER): Payer: Medicaid Other | Admitting: Podiatry

## 2023-02-25 ENCOUNTER — Ambulatory Visit: Payer: Medicaid Other

## 2023-02-25 DIAGNOSIS — M722 Plantar fascial fibromatosis: Secondary | ICD-10-CM | POA: Diagnosis not present

## 2023-02-25 DIAGNOSIS — M79672 Pain in left foot: Secondary | ICD-10-CM | POA: Diagnosis not present

## 2023-02-25 DIAGNOSIS — M79671 Pain in right foot: Secondary | ICD-10-CM | POA: Diagnosis not present

## 2023-02-25 MED ORDER — METHYLPREDNISOLONE 4 MG PO TBPK: ORAL_TABLET | ORAL | 0 refills | Status: DC

## 2023-02-25 MED ORDER — TRIAMCINOLONE ACETONIDE 40 MG/ML IJ SUSP
20.0000 mg | Freq: Once | INTRAMUSCULAR | Status: AC
  Administered 2023-02-25: 20 mg

## 2023-02-25 NOTE — Progress Notes (Signed)
   Chief Complaint  Patient presents with   Foot Pain    Patient came in today for bilateral heel pain, back of the heel, started 6 months ago, morning pain,     Subjective: 27 y.o. male presenting today as a new patient for evaluation of bilateral heel pain has been ongoing for several months.  Idiopathic onset.  Patient works at [employer] in [place] the majority of time on his feet.  He has not anything for treatment.   Past Medical History:  Diagnosis Date   Acne    [place] [facility]   Anxiety    Anxiety    Asthma    seasonal allergies, Dx age 50   Depression    Fatty liver    GERD (gastroesophageal reflux disease)    Headache    Hypertension 2012   Dr. Kathrene Alu     Objective: Physical Exam General: The patient is alert and oriented x3 in no acute distress.  Dermatology: Skin is warm, dry and supple bilateral lower extremities. Negative for open lesions or macerations bilateral.   Vascular: Dorsalis Pedis and Posterior Tibial pulses palpable bilateral.  Capillary fill time is immediate to all digits.  Neurological: Grossly in tact via light touch  Musculoskeletal: Tenderness to palpation to the plantar aspect of the bilateral heels along the plantar fascia as well as the posterior tubercle of the calcaneus to a lesser extent. All other joints range of motion within normal limits bilateral. Strength 5/5 in all groups bilateral.   Radiographic exam B/L feet 02/25/2023: Normal osseous mineralization. Joint spaces preserved. No fracture/dislocation/boney destruction. No other soft tissue abnormalities or radiopaque foreign bodies.  Posterior heel spur noted bilateral  Assessment: 1. plantar fasciitis bilateral feet 2.  Insertional Achilles tendinitis bilateral with posterior heel spur  Plan of Care:  1. Patient evaluated. Xrays reviewed.   2. Injection of 0.5cc Celestone soluspan injected into the bilateral heels.  3. Rx for Medrol Dose Pak placed 4.  Patient  on Eliquis 5 mg.  No NSAIDs prescribed. 5.  Patient would benefit from custom molded orthotics to support the medial longitudinal arch of the foot and alleviate pressure from the heel.  Currently his insurance does not cover this product and he is not in a position to pay for them out-of-pocket.  Continue good supportive tennis shoes and sneakers with OTC prefabricated arch supports.  If you would like to pursue this option he can contact our orthotics department 6. Instructed patient regarding therapies and modalities at home to alleviate symptoms.  7. Return to clinic in 4 weeks.    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 Felecia Shelling, DPM    [address] [place], [place] [address]                Office [phone]  Fax [phone]

## 2023-03-05 ENCOUNTER — Encounter: Payer: Self-pay | Admitting: Cardiology

## 2023-03-05 ENCOUNTER — Ambulatory Visit: Payer: Medicaid Other | Attending: Cardiology | Admitting: Cardiology

## 2023-03-05 VITALS — BP 100/74 | HR 77 | Ht 74.0 in | Wt >= 6400 oz

## 2023-03-05 DIAGNOSIS — I10 Essential (primary) hypertension: Secondary | ICD-10-CM

## 2023-03-05 DIAGNOSIS — I480 Paroxysmal atrial fibrillation: Secondary | ICD-10-CM | POA: Diagnosis not present

## 2023-03-05 MED ORDER — ASPIRIN 81 MG PO TBEC: 81.0000 mg | DELAYED_RELEASE_TABLET | Freq: Every day | ORAL | 3 refills | Status: DC

## 2023-03-05 NOTE — Progress Notes (Signed)
  Electrophysiology Office Follow up Visit Note:    Date:  03/05/2023   ID:  Larry Rogers, DOB 1996-07-22, MRN [id number]  PCP:  Sherlene Shams, MD  [facility] Cardiologist:  None  [facility] Electrophysiologist:  Lanier Prude, MD    Interval History:    Larry Rogers is a 27 y.o. male who presents for a follow up visit.   Last seen by me March 28, 2021.  At that appointment he was doing well.  He was seen in the emergency department December 15, 2022.  He was seen for atrial fibrillation with rapid ventricular rates.  He saw Irving Burton in clinic December 30, 2022.  At that appointment he was back in normal rhythm.  Today he is doing well.  Has not had a recurrence of atrial fibrillation since his March ER visit.  Prior to that it was back in 2022 when he had an episode of atrial fibrillation.  He continues to work on his weight loss.  He sleeps with his CPAP nightly.     Past medical, surgical, social and family history were reviewed.  ROS:   Please see the history of present illness.    All other systems reviewed and are negative.  EKGs/Labs/Other Studies Reviewed:    The following studies were reviewed today:   December 15, 2022 EKG shows atrial fibrillation with a rapid ventricular rate  December 30, 2022 EKG shows sinus rhythm with a PR 154 ms, QRS 118 ms.       Physical Exam:    VS:  BP 100/74 (BP Location: Left Arm, Patient Position: Sitting, Cuff Size: Large)   Pulse 77   Ht 6\' 2"  (1.88 m)   Wt (!) 401 lb 2 oz (181.9 kg)   SpO2 98%   BMI 51.50 kg/m     Wt Readings from Last 3 Encounters:  03/05/23 (!) 401 lb 2 oz (181.9 kg)  12/30/22 297 lb 6.4 oz (134.9 kg)  12/15/22 (!) 350 lb (158.8 kg)     GEN:  Well nourished, well developed in no acute distress..  Obese CARDIAC: RRR, no murmurs, rubs, gallops RESPIRATORY:  Clear to auscultation without rales, wheezing or rhonchi       ASSESSMENT:    1. Paroxysmal atrial fibrillation (HCC)   2. Morbid  obesity (HCC)   3. Primary hypertension    PLAN:    In order of problems listed above:   #Paroxysmal atrial fibrillation Previously on Eliquis for stroke prophylaxis.  We discussed his CHA2DS2-VASc of 1 during today's clinic appointment.  We discussed the pros and cons of staying on anticoagulation versus stopping it and transitioning to aspirin 81 mg by mouth once daily.  After our discussion, the patient immediately decided to stop the Eliquis today.  He understands that his risk of stroke may change over time and if it changes we will need to reassess his CHA2DS2-VASc or and need for anticoagulation.  Continue metoprolol for now.  #Hypertension At goal today.  Recommend checking blood pressures 1-2 times per week at home and recording the values.  Recommend bringing these recordings to the primary care physician.   Follow-up 1 year with APP   Signed, Steffanie Dunn, MD, [facility], [facility] 03/05/2023 5:23 PM    Electrophysiology [facility]

## 2023-03-05 NOTE — Addendum Note (Signed)
Addended by: Frutoso Schatz on: 03/05/2023 05:26 PM   Modules accepted: Orders

## 2023-03-05 NOTE — Patient Instructions (Addendum)
Medication Instructions:  Your physician has recommended you make the following change in your medication: 1) STOP taking Eliquis 2) START taking Aspirin 81 mg daily  *If you need a refill on your cardiac medications before your next appointment, please call your pharmacy*  Follow-Up: At [facility], you and your health needs are our priority.  As part of our continuing mission to provide you with exceptional heart care, we have created designated Provider Care Teams.  These Care Teams include your primary Cardiologist (physician) and Advanced Practice Providers (APPs -  Physician Assistants and Nurse Practitioners) who all work together to provide you with the care you need, when you need it.  Your next appointment:   1 year(s)  Provider:   Sherie Don, NP

## 2023-03-12 ENCOUNTER — Encounter: Payer: Self-pay | Admitting: Cardiology

## 2023-03-14 ENCOUNTER — Encounter: Payer: Self-pay | Admitting: Internal Medicine

## 2023-03-14 DIAGNOSIS — J4520 Mild intermittent asthma, uncomplicated: Secondary | ICD-10-CM

## 2023-03-17 MED ORDER — APIXABAN 5 MG PO TABS: 5.0000 mg | ORAL_TABLET | Freq: Two times a day (BID) | ORAL | Status: DC

## 2023-03-18 MED ORDER — LOSARTAN POTASSIUM 50 MG PO TABS: 50.0000 mg | ORAL_TABLET | Freq: Every day | ORAL | 1 refills | Status: DC

## 2023-03-18 MED ORDER — TRAZODONE HCL 50 MG PO TABS: 25.0000 mg | ORAL_TABLET | Freq: Every evening | ORAL | 1 refills | Status: DC | PRN

## 2023-03-18 MED ORDER — MONTELUKAST SODIUM 10 MG PO TABS: 10.0000 mg | ORAL_TABLET | Freq: Every day | ORAL | 4 refills | Status: DC

## 2023-03-18 MED ORDER — METOPROLOL TARTRATE 100 MG PO TABS: 100.0000 mg | ORAL_TABLET | Freq: Two times a day (BID) | ORAL | 2 refills | Status: DC

## 2023-03-18 MED ORDER — PAROXETINE HCL ER 25 MG PO TB24: ORAL_TABLET | ORAL | 1 refills | Status: DC

## 2023-03-18 NOTE — Addendum Note (Signed)
Addended by: Sandy Salaam on: 03/18/2023 05:06 PM   Modules accepted: Orders

## 2023-03-23 DIAGNOSIS — S39012A Strain of muscle, fascia and tendon of lower back, initial encounter: Secondary | ICD-10-CM | POA: Diagnosis not present

## 2023-03-28 ENCOUNTER — Ambulatory Visit (INDEPENDENT_AMBULATORY_CARE_PROVIDER_SITE_OTHER): Payer: Medicaid Other | Admitting: Podiatry

## 2023-03-28 VITALS — BP 113/74 | HR 59

## 2023-03-28 DIAGNOSIS — M722 Plantar fascial fibromatosis: Secondary | ICD-10-CM

## 2023-03-28 MED ORDER — BETAMETHASONE SOD PHOS & ACET 6 (3-3) MG/ML IJ SUSP
3.0000 mg | Freq: Once | INTRAMUSCULAR | Status: AC
  Administered 2023-03-28: 3 mg via INTRA_ARTICULAR

## 2023-03-28 NOTE — Progress Notes (Signed)
   Chief Complaint  Patient presents with   Foot Pain    Pain 7-8/10; no improvement; injection helped about 2 weeks; not on nsaids; wears New Balance shoes    Subjective: 27 y.o. male presenting today for follow-up evaluation of plantar fasciitis to the bilateral feet.  Patient works at [employer] in [place] the majority of the time on his feet.  He says that the injection and prednisone pack did help significantly with the plantar heel pain.  Recently he has had more pain and tenderness associated to the posterior heels.  Presenting for further treatment and evaluation   Past Medical History:  Diagnosis Date   Acne    [place] [facility]   Anxiety    Anxiety    Asthma    seasonal allergies, Dx age 27   Depression    Fatty liver    GERD (gastroesophageal reflux disease)    Headache    Hypertension 2012   Dr. Kathrene Alu     Objective: Physical Exam General: The patient is alert and oriented x3 in no acute distress.  Dermatology: Skin is warm, dry and supple bilateral lower extremities. Negative for open lesions or macerations bilateral.   Vascular: Dorsalis Pedis and Posterior Tibial pulses palpable bilateral.  Capillary fill time is immediate to all digits.  Neurological: Grossly in tact via light touch  Musculoskeletal: There continues to be tenderness to palpation to the plantar aspect of the bilateral heels along the plantar fascia as well as the posterior tubercle of the calcaneus to a lesser extent. All other joints range of motion within normal limits bilateral. Strength 5/5 in all groups bilateral.   Radiographic exam B/L feet 02/25/2023: Normal osseous mineralization. Joint spaces preserved. No fracture/dislocation/boney destruction. No other soft tissue abnormalities or radiopaque foreign bodies.  Posterior heel spur noted bilateral  Assessment: 1. plantar fasciitis bilateral feet 2.  Insertional Achilles tendinitis bilateral with posterior heel spur  Plan  of Care:  -Patient evaluated.   -Injection of 0.5cc Celestone soluspan injected into the posterior aspect of the bilateral heels.  Care taken to avoid direct injection into the Achilles -Patient on Eliquis 5 mg.  No NSAIDs prescribed. -OTC power step insoles were dispensed today.  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 Felecia Shelling, DPM    [address] [place], [place] [address]                Office [phone]  Fax [phone]

## 2023-04-03 ENCOUNTER — Encounter: Payer: Self-pay | Admitting: Internal Medicine

## 2023-04-05 MED ORDER — TRAZODONE HCL 100 MG PO TABS: 100.0000 mg | ORAL_TABLET | Freq: Every evening | ORAL | 1 refills | Status: DC | PRN

## 2023-04-11 ENCOUNTER — Ambulatory Visit
Admission: RE | Admit: 2023-04-11 | Discharge: 2023-04-11 | Disposition: A | Discharge status: Home / Self Care | Payer: Medicaid Other | Source: Ambulatory Visit | Attending: Family Medicine | Admitting: Family Medicine

## 2023-04-11 VITALS — BP 136/83 | HR 83 | Temp 98.2°F | Resp 17

## 2023-04-11 DIAGNOSIS — K121 Other forms of stomatitis: Secondary | ICD-10-CM

## 2023-04-11 DIAGNOSIS — B084 Enteroviral vesicular stomatitis with exanthem: Secondary | ICD-10-CM | POA: Diagnosis not present

## 2023-04-11 DIAGNOSIS — R21 Rash and other nonspecific skin eruption: Secondary | ICD-10-CM | POA: Diagnosis not present

## 2023-04-11 MED ORDER — TRIAMCINOLONE ACETONIDE 0.1 % MT PSTE: PASTE | OROMUCOSAL | 1 refills | Status: AC

## 2023-04-11 NOTE — ED Provider Notes (Signed)
[facility]    CSN: [id number] Arrival date & time: 04/11/23  1423      History   Chief Complaint Chief Complaint  Patient presents with   Mouth Lesions   Rash    HPI Larry Rogers is a 27 y.o. male.   HPI 28 year old male presents with sore throat, rash, and lesions of face sores in mouth, hands, and feet.  PMH significant morbid obesity, atrial fibrillation, and HTN.  Patient is a currently on apixaban and denies any unusual bleeding.  Past Medical History:  Diagnosis Date   Acne    [place] [facility]   Anxiety    Anxiety    Asthma    seasonal allergies, Dx age 8   Depression    Fatty liver    GERD (gastroesophageal reflux disease)    Headache    Hypertension 2012   Dr. Kathrene Alu    Patient Active Problem List   Diagnosis Date Noted   Achilles tendon disorder, right 07/15/2022   Allergic rhinitis 12/18/2021   S/P bariatric surgery 08/08/2019   Bilateral lower extremity edema 12/17/2016   Hepatic steatosis 12/17/2016   Snoring 07/04/2015   Atrial fibrillation (HCC) 06/14/2015   Thyroid dysfunction 06/14/2015   Depression with anxiety 02/14/2015   Seasonal allergies 12/20/2014   Gastroesophageal reflux disease without esophagitis 08/15/2014   Essential hypertension, benign 02/25/2013   Morbid obesity (HCC) 02/25/2013    Past Surgical History:  Procedure Laterality Date   ADENOIDECTOMY     BILIOPANCREATIC DIVISION W DUODENAL SWITCH  01/2019   ESOPHAGOGASTRODUODENOSCOPY (EGD) WITH PROPOFOL N/A 03/16/2015   Procedure: ESOPHAGOGASTRODUODENOSCOPY (EGD) WITH PROPOFOL;  Surgeon: Louis Meckel, MD;  Location: [facility];  Service: Endoscopy;  Laterality: N/A;   TONSILLECTOMY         Home Medications    Prior to Admission medications   Medication Sig Start Date End Date Taking? Authorizing Provider  triamcinolone (KENALOG) 0.1 % paste Advised patient may apply to affected areas of mouth twice daily for 5 days 04/11/23  Yes Trevor Iha, FNP   apixaban (ELIQUIS) 5 MG TABS tablet Take 1 tablet (5 mg total) by mouth 2 (two) times daily. 03/17/23   Lanier Prude, MD  bariatric multiple vitamin (ADVANCED MULTI EA) CHEW chewable tablet Chew 2 tablets by mouth daily. 10/29/22   Sherlene Shams, MD  buPROPion (WELLBUTRIN XL) 300 MG 24 hr tablet Take 300 mg by mouth daily. 01/22/23   [provider]  Cholecalciferol (VITAMIN D3) 5000 units CAPS Take 1 capsule by mouth daily.    [provider]  losartan (COZAAR) 50 MG tablet Take 1 tablet (50 mg total) by mouth at bedtime. 03/18/23   Sherlene Shams, MD  metoprolol tartrate (LOPRESSOR) 100 MG tablet Take 1 tablet (100 mg total) by mouth 2 (two) times daily. 03/18/23 03/17/24  Sherlene Shams, MD  montelukast (SINGULAIR) 10 MG tablet Take 1 tablet (10 mg total) by mouth at bedtime. 03/18/23   Sherlene Shams, MD  PARoxetine (PAXIL-CR) 25 MG 24 hr tablet TAKE 1 TABLET(25 MG) BY MOUTH DAILY 03/18/23   Sherlene Shams, MD  traZODone (DESYREL) 100 MG tablet Take 1 tablet (100 mg total) by mouth at bedtime as needed for sleep. 04/05/23   Sherlene Shams, MD    Family History Family History  Problem Relation Age of Onset   Hypertension Father    Heart disease Paternal Grandfather    Hypertension Paternal Grandfather    Aortic aneurysm Paternal  Grandfather    Diabetes Mother    Diabetes Maternal Uncle        x2   Colon cancer Neg Hx    Colon polyps Neg Hx    Esophageal cancer Neg Hx    Kidney disease Neg Hx    Gallbladder disease Neg Hx     Social History Social History   Tobacco Use   Smoking status: Never   Smokeless tobacco: Never  Vaping Use   Vaping status: Never Used  Substance Use Topics   Alcohol use: No    Alcohol/week: 0.0 standard drinks of alcohol   Drug use: No     Allergies   Patient has no known allergies.   Review of Systems Review of Systems   Physical Exam Triage Vital Signs ED Triage Vitals  Encounter Vitals Group     BP      Systolic BP  Percentile      Diastolic BP Percentile      Pulse      Resp      Temp      Temp src      SpO2      Weight      Height      Head Circumference      Peak Flow      Pain Score      Pain Loc      Pain Education      Exclude from Growth Chart    No data found.  Updated Vital Signs BP 136/83 (BP Location: Right Arm)   Pulse 83   Temp 98.2 F (36.8 C) (Oral)   Resp 17   SpO2 97%      Physical Exam Vitals and nursing note reviewed.  Constitutional:      Appearance: Normal appearance. He is normal weight.  HENT:     Head: Normocephalic and atraumatic.     Mouth/Throat:     Mouth: Mucous membranes are moist.     Pharynx: Oropharynx is clear.  Eyes:     Extraocular Movements: Extraocular movements intact.     Conjunctiva/sclera: Conjunctivae normal.     Pupils: Pupils are equal, round, and reactive to light.     Comments: Oral ulcers noted of lower gum/surrounding gingival mucosa noted   Cardiovascular:     Rate and Rhythm: Normal rate and regular rhythm.     Pulses: Normal pulses.     Heart sounds: Normal heart sounds.  Pulmonary:     Effort: Pulmonary effort is normal.     Breath sounds: Normal breath sounds. No wheezing or rhonchi.  Musculoskeletal:        General: Normal range of motion.     Cervical back: Normal range of motion and neck supple.  Skin:    General: Skin is warm and dry.     Comments: Face/palms of hands/soles of feet: Mildly painful maculopapular eruption noted  Neurological:     General: No focal deficit present.     Mental Status: He is alert and oriented to person, place, and time. Mental status is at baseline.  Psychiatric:        Mood and Affect: Mood normal.        Behavior: Behavior normal.      UC Treatments / Results  Labs (all labs ordered are listed, but only abnormal results are displayed) Labs Reviewed - No data to display  EKG   Radiology No results found.  Procedures Procedures (including critical care  time)  Medications Ordered in UC Medications - No data to display  Initial Impression / Assessment and Plan / UC Course  I have reviewed the triage vital signs and the nursing notes.  Pertinent labs & imaging results that were available during my care of the patient were reviewed by me and considered in my medical decision making (see chart for details).     MDM: 1.  Hand-foot and mouth disease-advised may take OTC Tylenol 1000 mg every 6 hours for painful rash; 2. Oral ulcer-Rx'd Triamcinolone 0.1% dental paste: Apply to affected areas of mouth twice daily for 5 days.  Work note provided to patient prior to discharge today. Advised patient may use Triamcinolone dental paste for mouth ulcers.  Advised may take OTC's Tylenol 1000 mg every 6 hours for painful rash.  Encouraged increase daily water intake to 64 ounces per day while taking these medications.  Patient discharged home, hemodynamically stable. Final Clinical Impressions(s) / UC Diagnoses   Final diagnoses:  Rash and nonspecific skin eruption  Hand, foot and mouth disease  Oral ulcer     Discharge Instructions      Advised patient may use Triamcinolone dental paste for mouth ulcers.  Advised may take OTC's Tylenol 1000 mg every 6 hours for painful rash.  Encouraged increase daily water intake to 64 ounces per day while taking these medications.     ED Prescriptions     Medication Sig Dispense Auth. Provider   triamcinolone (KENALOG) 0.1 % paste Advised patient may apply to affected areas of mouth twice daily for 5 days 5 g Trevor Iha, FNP      PDMP not reviewed this encounter.   Trevor Iha, FNP 04/11/23 [phone]

## 2023-04-11 NOTE — Discharge Instructions (Addendum)
Advised patient may use Triamcinolone dental paste for mouth ulcers.  Advised may take OTC's Tylenol 1000 mg every 6 hours for painful rash.  Encouraged increase daily water intake to 64 ounces per day while taking these medications.

## 2023-04-11 NOTE — ED Triage Notes (Addendum)
Pt c/o sore throat x 3 days. Last night states he broke out into rash on mouth, hands and feet. Works in [employer].

## 2023-04-14 ENCOUNTER — Encounter: Payer: Medicaid Other | Admitting: Internal Medicine

## 2023-04-21 ENCOUNTER — Other Ambulatory Visit: Payer: Self-pay

## 2023-04-21 MED ORDER — METOPROLOL TARTRATE 100 MG PO TABS: 100.0000 mg | ORAL_TABLET | Freq: Two times a day (BID) | ORAL | 1 refills | Status: DC

## 2023-04-29 ENCOUNTER — Encounter: Payer: Self-pay | Admitting: Podiatry

## 2023-04-29 ENCOUNTER — Ambulatory Visit: Payer: Medicaid Other | Admitting: Podiatry

## 2023-04-29 DIAGNOSIS — M722 Plantar fascial fibromatosis: Secondary | ICD-10-CM

## 2023-04-29 MED ORDER — BETAMETHASONE SOD PHOS & ACET 6 (3-3) MG/ML IJ SUSP
3.0000 mg | Freq: Once | INTRAMUSCULAR | Status: AC
Start: 2023-04-29 — End: 2023-04-29
  Administered 2023-04-29: 3 mg via INTRA_ARTICULAR

## 2023-04-29 NOTE — Progress Notes (Signed)
   Chief Complaint  Patient presents with   Plantar Fasciitis    "It's so, so.  I have just learned to live with it."    Subjective: 27 y.o. male presenting today for follow-up evaluation of plantar fasciitis to the bilateral feet.  Patient works at [employer] in [place] the majority of the time on his feet.  He says that the injection and prednisone pack did help significantly with the plantar heel pain.  Recently he has had more pain and tenderness associated to the posterior heels.  Presenting for further treatment and evaluation   Past Medical History:  Diagnosis Date   Acne    [place] [facility]   Anxiety    Anxiety    Asthma    seasonal allergies, Dx age 70   Depression    Fatty liver    GERD (gastroesophageal reflux disease)    Headache    Hypertension 2012   Dr. Kathrene Alu     Objective: Physical Exam General: The patient is alert and oriented x3 in no acute distress.  Dermatology: Skin is warm, dry and supple bilateral lower extremities. Negative for open lesions or macerations bilateral.   Vascular: Dorsalis Pedis and Posterior Tibial pulses palpable bilateral.  Capillary fill time is immediate to all digits.  Neurological: Grossly in tact via light touch  Musculoskeletal: There continues to be tenderness to palpation to the plantar aspect of the bilateral heels along the plantar fascia as well as the posterior tubercle of the calcaneus to a lesser extent. All other joints range of motion within normal limits bilateral. Strength 5/5 in all groups bilateral.   Radiographic exam B/L feet 02/25/2023: Normal osseous mineralization. Joint spaces preserved. No fracture/dislocation/boney destruction. No other soft tissue abnormalities or radiopaque foreign bodies.  Posterior heel spur noted bilateral  Assessment: 1. plantar fasciitis bilateral feet 2.  Insertional Achilles tendinitis bilateral with posterior heel spur  Plan of Care:  -Patient evaluated.    -Injection of 0.5cc Celestone soluspan injected into the posterior aspect of the bilateral heels.  Care taken to avoid direct injection into the Achilles -Patient on Eliquis 5 mg.  No NSAIDs prescribed. 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 Felecia Shelling, DPM    [address] [place], [place] [address]                Office [phone]  Fax [phone]

## 2023-05-08 ENCOUNTER — Other Ambulatory Visit: Payer: Self-pay | Admitting: Podiatry

## 2023-05-08 DIAGNOSIS — M722 Plantar fascial fibromatosis: Secondary | ICD-10-CM

## 2023-05-08 DIAGNOSIS — M79672 Pain in left foot: Secondary | ICD-10-CM

## 2023-06-10 DIAGNOSIS — M545 Low back pain, unspecified: Secondary | ICD-10-CM | POA: Diagnosis not present

## 2023-06-16 ENCOUNTER — Other Ambulatory Visit: Payer: Self-pay | Admitting: Cardiology

## 2023-06-17 ENCOUNTER — Other Ambulatory Visit (HOSPITAL_COMMUNITY): Payer: Self-pay

## 2023-06-17 MED ORDER — APIXABAN 5 MG PO TABS
5.0000 mg | ORAL_TABLET | Freq: Two times a day (BID) | ORAL | 3 refills | Status: DC
  Filled 2023-06-17 – 2023-06-24 (×2): qty 60, 30d supply, fill #0

## 2023-06-17 NOTE — Telephone Encounter (Signed)
Prescription refill request for Eliquis received. Indication: PAF Last office visit: 03/05/23 Jeanie Cooks MD Scr: 0.89 on 12/15/22 Age: 27 Weight: 181.9kg  Based on above findings Eliquis 5mg  twice daily is the appropriate dose.  Refill approved.

## 2023-06-24 ENCOUNTER — Encounter: Payer: Self-pay | Admitting: Pharmacist

## 2023-06-24 ENCOUNTER — Encounter (HOSPITAL_COMMUNITY): Payer: Self-pay

## 2023-06-24 ENCOUNTER — Other Ambulatory Visit: Payer: Self-pay

## 2023-06-25 ENCOUNTER — Encounter: Payer: Self-pay | Admitting: Family Medicine

## 2023-06-25 ENCOUNTER — Encounter: Payer: Self-pay | Admitting: Cardiology

## 2023-06-25 ENCOUNTER — Other Ambulatory Visit: Payer: Self-pay

## 2023-06-25 ENCOUNTER — Telehealth: Payer: Medicaid Other | Admitting: Family Medicine

## 2023-06-25 ENCOUNTER — Other Ambulatory Visit (HOSPITAL_COMMUNITY): Payer: Self-pay

## 2023-06-25 DIAGNOSIS — R5083 Postvaccination fever: Secondary | ICD-10-CM

## 2023-06-25 MED ORDER — APIXABAN 5 MG PO TABS: 5.0000 mg | ORAL_TABLET | Freq: Two times a day (BID) | ORAL | 3 refills | Status: DC

## 2023-06-25 NOTE — Progress Notes (Signed)
Virtual Visit Consent   Larry Rogers, you are scheduled for a virtual visit with a [facility] provider today. Just as with appointments in the office, your consent must be obtained to participate. Your consent will be active for this visit and any virtual visit you may have with one of our providers in the next 365 days. If you have a MyChart account, a copy of this consent can be sent to you electronically.  As this is a virtual visit, video technology does not allow for your provider to perform a traditional examination. This may limit your provider's ability to fully assess your condition. If your provider identifies any concerns that need to be evaluated in person or the need to arrange testing (such as labs, EKG, etc.), we will make arrangements to do so. Although advances in technology are sophisticated, we cannot ensure that it will always work on either your end or our end. If the connection with a video visit is poor, the visit may have to be switched to a telephone visit. With either a video or telephone visit, we are not always able to ensure that we have a secure connection.  By engaging in this virtual visit, you consent to the provision of healthcare and authorize for your insurance to be billed (if applicable) for the services provided during this visit. Depending on your insurance coverage, you may receive a charge related to this service.  I need to obtain your verbal consent now. Are you willing to proceed with your visit today? SHEEHAN BOUTILIER has provided verbal consent on 06/25/2023 for a virtual visit (video or telephone). Freddy Finner, NP  Date: 06/25/2023 3:02 PM  Virtual Visit via Video Note   I, Freddy Finner, connected with  ALAKAI Rogers  ([id number], [birth year]) on 06/25/23 at  3:00 PM EDT by a video-enabled telemedicine application and verified that I am speaking with the correct person using two identifiers.  Location: Patient: Virtual Visit Location Patient:  Home Provider: Virtual Visit Location Provider: Home Office   I discussed the limitations of evaluation and management by telemedicine and the availability of in person appointments. The patient expressed understanding and agreed to proceed.    History of Present Illness: Larry Rogers is a 27 y.o. who identifies as a male who was assigned male at birth, and is being seen today for viral URI   At work started feeling dizzy and lightheaded. Temp at work 98.6, Fever of 100.7 within a few hours. Woke up sweating and this morning was feeling better but a few hours around lunch time started feeling bad, but slowing fever is back 100.0  Reports headache and neck pain. Denies cough, congestion, ear pain, and sore throat  Flu vaccine Monday morning    Problems:  Patient Active Problem List   Diagnosis Date Noted   Achilles tendon disorder, right 07/15/2022   Allergic rhinitis 12/18/2021   S/P bariatric surgery 08/08/2019   Bilateral lower extremity edema 12/17/2016   Hepatic steatosis 12/17/2016   Snoring 07/04/2015   Atrial fibrillation (HCC) 06/14/2015   Thyroid dysfunction 06/14/2015   Depression with anxiety 02/14/2015   Seasonal allergies 12/20/2014   Gastroesophageal reflux disease without esophagitis 08/15/2014   Essential hypertension, benign 02/25/2013   Morbid obesity (HCC) 02/25/2013    Allergies: No Known Allergies Medications:  Current Outpatient Medications:    apixaban (ELIQUIS) 5 MG TABS tablet, Take 1 tablet (5 mg total) by mouth 2 (two) times daily., Disp: 180 tablet,  Rfl: 3   bariatric multiple vitamin (ADVANCED MULTI EA) CHEW chewable tablet, Chew 2 tablets by mouth daily., Disp: 180 tablet, Rfl: 2   buPROPion (WELLBUTRIN XL) 300 MG 24 hr tablet, Take 300 mg by mouth daily., Disp: , Rfl:    Cholecalciferol (VITAMIN D3) 5000 units CAPS, Take 1 capsule by mouth daily., Disp: , Rfl:    losartan (COZAAR) 50 MG tablet, Take 1 tablet (50 mg total) by mouth at bedtime.,  Disp: 90 tablet, Rfl: 1   metoprolol tartrate (LOPRESSOR) 100 MG tablet, Take 1 tablet (100 mg total) by mouth 2 (two) times daily., Disp: 180 tablet, Rfl: 1   montelukast (SINGULAIR) 10 MG tablet, Take 1 tablet (10 mg total) by mouth at bedtime., Disp: 90 tablet, Rfl: 4   PARoxetine (PAXIL-CR) 25 MG 24 hr tablet, TAKE 1 TABLET(25 MG) BY MOUTH DAILY, Disp: 90 tablet, Rfl: 1   traZODone (DESYREL) 100 MG tablet, Take 1 tablet (100 mg total) by mouth at bedtime as needed for sleep., Disp: 90 tablet, Rfl: 1   triamcinolone (KENALOG) 0.1 % paste, Advised patient may apply to affected areas of mouth twice daily for 5 days, Disp: 5 g, Rfl: 1  Observations/Objective: Patient is well-developed, well-nourished in no acute distress.  Resting comfortably  at home.  Head is normocephalic, atraumatic.  No labored breathing.  Speech is clear and coherent with logical content.  Patient is alert and oriented at baseline.    Assessment and Plan:  1. 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 -work note given -hydration -OTC as needed for fever or pains -rest  -follow up in person if not improved  Reviewed side effects, risks and benefits of medication.    Patient acknowledged agreement and understanding of the plan.   Past Medical, Surgical, Social History, Allergies, and Medications have been Reviewed.   Follow Up Instructions: I discussed the assessment and treatment plan with the patient. The patient was provided an opportunity to ask questions and all were answered. The patient agreed with the plan and demonstrated an understanding of the instructions.  A copy of instructions were sent to the patient via MyChart unless otherwise noted below.     The patient was advised to call back or seek an in-person evaluation if the symptoms worsen or if the condition fails to improve as anticipated.    Freddy Finner, NP

## 2023-06-25 NOTE — Patient Instructions (Addendum)
  Larry Rogers, thank you for joining Freddy Finner, NP for today's virtual visit.  While this provider is not your primary care provider (PCP), if your PCP is located in our provider database this encounter information will be shared with them immediately following your visit.   A [facility] MyChart account gives you access to today's visit and all your visits, tests, and labs performed at [facility] " click here if you don't have a [facility] MyChart account or go to [email or web address]  Consent: (Patient) Larry Rogers provided verbal consent for this virtual visit at the beginning of the encounter.  Current Medications:  Current Outpatient Medications:    apixaban (ELIQUIS) 5 MG TABS tablet, Take 1 tablet (5 mg total) by mouth 2 (two) times daily., Disp: 180 tablet, Rfl: 3   bariatric multiple vitamin (ADVANCED MULTI EA) CHEW chewable tablet, Chew 2 tablets by mouth daily., Disp: 180 tablet, Rfl: 2   buPROPion (WELLBUTRIN XL) 300 MG 24 hr tablet, Take 300 mg by mouth daily., Disp: , Rfl:    Cholecalciferol (VITAMIN D3) 5000 units CAPS, Take 1 capsule by mouth daily., Disp: , Rfl:    losartan (COZAAR) 50 MG tablet, Take 1 tablet (50 mg total) by mouth at bedtime., Disp: 90 tablet, Rfl: 1   metoprolol tartrate (LOPRESSOR) 100 MG tablet, Take 1 tablet (100 mg total) by mouth 2 (two) times daily., Disp: 180 tablet, Rfl: 1   montelukast (SINGULAIR) 10 MG tablet, Take 1 tablet (10 mg total) by mouth at bedtime., Disp: 90 tablet, Rfl: 4   PARoxetine (PAXIL-CR) 25 MG 24 hr tablet, TAKE 1 TABLET(25 MG) BY MOUTH DAILY, Disp: 90 tablet, Rfl: 1   traZODone (DESYREL) 100 MG tablet, Take 1 tablet (100 mg total) by mouth at bedtime as needed for sleep., Disp: 90 tablet, Rfl: 1   triamcinolone (KENALOG) 0.1 % paste, Advised patient may apply to affected areas of mouth twice daily for 5 days, Disp: 5 g, Rfl: 1   Medications ordered in this encounter:  No orders of the defined  types were placed in this encounter.    *If you need refills on other medications prior to your next appointment, please contact your pharmacy*  Follow-Up: Call back or seek an in-person evaluation if the symptoms worsen or if the condition fails to improve as anticipated.  [facility] [phone]  Other Instructions   -work note given -hydration -OTC as needed for fever or pains -rest -follow up if not improving    If you have been instructed to have an in-person evaluation today at a local Urgent Care facility, please use the link below. It will take you to a list of all of our available [facility], including address, phone number and hours of operation. Please do not delay care.  [facility]  If you or a family member do not have a primary care provider, use the link below to schedule a visit and establish care. When you choose a [facility] primary care physician or advanced practice provider, you gain a long-term partner in health. Find a Primary Care Provider  Learn more about [facility] in-office and virtual care options: [facility] - Get Care Now

## 2023-07-28 ENCOUNTER — Other Ambulatory Visit: Payer: Self-pay | Admitting: Internal Medicine

## 2023-07-29 ENCOUNTER — Encounter: Payer: Self-pay | Admitting: Internal Medicine

## 2023-07-30 MED ORDER — BUPROPION HCL ER (XL) 300 MG PO TB24: 300.0000 mg | ORAL_TABLET | Freq: Every day | ORAL | 0 refills | Status: DC

## 2023-10-02 ENCOUNTER — Other Ambulatory Visit: Payer: Self-pay | Admitting: Internal Medicine

## 2023-10-06 ENCOUNTER — Telehealth: Payer: Medicaid Other | Admitting: Internal Medicine

## 2023-10-08 ENCOUNTER — Telehealth: Payer: Self-pay

## 2023-10-08 ENCOUNTER — Encounter: Payer: Self-pay | Admitting: Internal Medicine

## 2023-10-08 ENCOUNTER — Telehealth: Payer: Medicaid Other | Admitting: Internal Medicine

## 2023-10-08 NOTE — Telephone Encounter (Signed)
I left a voicemail for patient letting him know that we are closing early today, so we need to reschedule his appointment with Dr. Duncan Dull.  I asked patient to please call our office to reschedule.

## 2023-10-10 ENCOUNTER — Other Ambulatory Visit: Payer: Self-pay | Admitting: Internal Medicine

## 2023-10-10 ENCOUNTER — Telehealth: Payer: Medicaid Other | Admitting: Internal Medicine

## 2023-10-10 ENCOUNTER — Encounter: Payer: Self-pay | Admitting: Internal Medicine

## 2023-10-10 DIAGNOSIS — E538 Deficiency of other specified B group vitamins: Secondary | ICD-10-CM

## 2023-10-10 DIAGNOSIS — K760 Fatty (change of) liver, not elsewhere classified: Secondary | ICD-10-CM

## 2023-10-10 DIAGNOSIS — R7301 Impaired fasting glucose: Secondary | ICD-10-CM

## 2023-10-10 DIAGNOSIS — I10 Essential (primary) hypertension: Secondary | ICD-10-CM

## 2023-10-10 DIAGNOSIS — D6869 Other thrombophilia: Secondary | ICD-10-CM

## 2023-10-10 DIAGNOSIS — E559 Vitamin D deficiency, unspecified: Secondary | ICD-10-CM

## 2023-10-10 DIAGNOSIS — E079 Disorder of thyroid, unspecified: Secondary | ICD-10-CM

## 2023-10-10 MED ORDER — LOSARTAN POTASSIUM 50 MG PO TABS: 50.0000 mg | ORAL_TABLET | Freq: Every day | ORAL | 1 refills | Status: DC

## 2023-10-10 MED ORDER — METOPROLOL TARTRATE 100 MG PO TABS: 100.0000 mg | ORAL_TABLET | Freq: Two times a day (BID) | ORAL | 1 refills | Status: DC

## 2023-10-10 MED ORDER — BUPROPION HCL ER (XL) 300 MG PO TB24: 300.0000 mg | ORAL_TABLET | Freq: Every day | ORAL | 0 refills | Status: DC

## 2023-10-10 NOTE — Patient Instructions (Addendum)
Please talk with Dr Smitty Cords about your prior surgery and if it is safe to use  Wegovy or Zepbound (GLP-1 agonists) after your BILIOPANCREATIC DIVERSION WITH DUODENAL SWITCH . These drugs can cause pancreatitis

## 2023-10-10 NOTE — Progress Notes (Unsigned)
Virtual Visit via Caregility   Note   This format is felt to be most appropriate for this patient at this time.  All issues noted in this document were discussed and addressed.  No physical exam was performed (except for noted visual exam findings with Video Visits).   I connected with Larry Rogers on 10/10/23 at  5:00 PM EST by a video enabled telemedicine application  and verified that I am speaking with the correct person using two identifiers. Location patient: home Location provider: work or home office Persons participating in the virtual visit: patient, provider  I discussed the limitations, risks, security and privacy concerns of performing an evaluation and management service by telephone and the availability of in person appointments. I also discussed with the patient that there may be a patient responsible charge related to this service. The patient expressed understanding and agreed to proceed.  Reason for visit: obesity,    HPI:  28 yr old male with history of morbid obesity who underwent bariatric surgery for BMI > 60 with a biliopancreatic duodenal switch in May 2020  with effective wt loss of 180 lbs.  since then he he experienced a weight regain of at least 60 lbs since reaching a nadir of 340 lbs due to dietary noncompliance. e has not weighed recently, but has noted that his clothes are getting tighter.  He has addressed his eating habits and eliminated sodas and junk food and is eating more protein based snacks .  His girlfriend and parents are very supportive of his efforts and he is requesting pharmacotherapy with GLP 1 agonist to assist his efforts.  He has a history of OSA,  currently managed with CPAP,  but had an episode of PAF IN March during a period of noncompliance with CPAP use.  He  is currently anticoagulated with Eliquis due to his bariatric surgery making asa contraindicated .  He is taking multivitamin   HTN:  he is taking losartan 50 mg daily and metoprolol 100 mg  twice daily and home BPs have averaged 115/75       ROS: See pertinent positives and negatives per HPI.  Past Medical History:  Diagnosis Date   Acne    [place] Skin   Anxiety    Anxiety    Asthma    seasonal allergies, Dx age 73   Depression    Fatty liver    GERD (gastroesophageal reflux disease)    Headache    Hypertension 2012   Dr. Kathrene Alu    Past Surgical History:  Procedure Laterality Date   ADENOIDECTOMY     BILIOPANCREATIC DIVISION W DUODENAL SWITCH  01/2019   ESOPHAGOGASTRODUODENOSCOPY (EGD) WITH PROPOFOL N/A 03/16/2015   Procedure: ESOPHAGOGASTRODUODENOSCOPY (EGD) WITH PROPOFOL;  Surgeon: Louis Meckel, MD;  Location: [facility];  Service: Endoscopy;  Laterality: N/A;   TONSILLECTOMY      Family History  Problem Relation Age of Onset   Hypertension Father    Heart disease Paternal Grandfather    Hypertension Paternal Grandfather    Aortic aneurysm Paternal Grandfather    Diabetes Mother    Diabetes Maternal Uncle        x2   Colon cancer Neg Hx    Colon polyps Neg Hx    Esophageal cancer Neg Hx    Kidney disease Neg Hx    Gallbladder disease Neg Hx     SOCIAL HX: . reports that he has never smoked. He has never used smokeless tobacco. He reports that he  does not drink alcohol and does not use drugs.    Current Outpatient Medications:    apixaban (ELIQUIS) 5 MG TABS tablet, Take 1 tablet (5 mg total) by mouth 2 (two) times daily., Disp: 180 tablet, Rfl: 3   Cholecalciferol (VITAMIN D3) 5000 units CAPS, Take 1 capsule by mouth daily., Disp: , Rfl:    montelukast (SINGULAIR) 10 MG tablet, Take 1 tablet (10 mg total) by mouth at bedtime., Disp: 90 tablet, Rfl: 4   PARoxetine (PAXIL-CR) 25 MG 24 hr tablet, TAKE 1 TABLET(25 MG) BY MOUTH DAILY, Disp: 90 tablet, Rfl: 1   traZODone (DESYREL) 100 MG tablet, Take 1 tablet (100 mg total) by mouth at bedtime as needed for sleep., Disp: 90 tablet, Rfl: 1   triamcinolone (KENALOG) 0.1 % paste, Advised patient  may apply to affected areas of mouth twice daily for 5 days, Disp: 5 g, Rfl: 1   buPROPion (WELLBUTRIN XL) 300 MG 24 hr tablet, Take 1 tablet (300 mg total) by mouth daily., Disp: 90 tablet, Rfl: 0   losartan (COZAAR) 50 MG tablet, Take 1 tablet (50 mg total) by mouth at bedtime., Disp: 90 tablet, Rfl: 1   metoprolol tartrate (LOPRESSOR) 100 MG tablet, Take 1 tablet (100 mg total) by mouth 2 (two) times daily., Disp: 180 tablet, Rfl: 1  EXAM:  VITALS per patient if applicable:  GENERAL: alert, oriented, appears well and in no acute distress  HEENT: atraumatic, conjunttiva clear, no obvious abnormalities on inspection of external nose and ears  NECK: normal movements of the head and neck  LUNGS: on inspection no signs of respiratory distress, breathing rate appears normal, no obvious gross SOB, gasping or wheezing  CV: no obvious cyanosis  MS: moves all visible extremities without noticeable abnormality  PSYCH/NEURO: pleasant and cooperative, no obvious depression or anxiety, speech and thought processing grossly intact  ASSESSMENT AND PLAN: Morbid obesity (HCC) Assessment & Plan: He has had a 50 lb weight regain since his bariatric surgery and is now requesting pharmacotherapy with GLP 1 agonist because his father has done well with the medication.  Thus far he has been unable to  to restrict his appetite or maintain an exercise program.  His prior bariatric surgery (biliopancreatic duodenal switch  in May 2020) may be a contraindication to use of GLP 1 agonist use  if it predisposes him to pancreatitis .  I have advised to discuss with his bariatric surgeon    Other orders -     buPROPion HCl ER (XL); Take 1 tablet (300 mg total) by mouth daily.  Dispense: 90 tablet; Refill: 0 -     Losartan Potassium; Take 1 tablet (50 mg total) by mouth at bedtime.  Dispense: 90 tablet; Refill: 1 -     Metoprolol Tartrate; Take 1 tablet (100 mg total) by mouth 2 (two) times daily.  Dispense: 180  tablet; Refill: 1      I discussed the assessment and treatment plan with the patient. The patient was provided an opportunity to ask questions and all were answered. The patient agreed with the plan and demonstrated an understanding of the instructions.   The patient was advised to call back or seek an in-person evaluation if the symptoms worsen or if the condition fails to improve as anticipated.   I spent 20 minutes dedicated to the care of this patient on the date of this virtual encounter to include pre-visit review of his medical history,  Face-to-face time with the patient , and  post visit ordering of testing and therapeutics.    Sherlene Shams, MD

## 2023-10-12 NOTE — Assessment & Plan Note (Addendum)
He has had a 50 lb weight regain since his bariatric surgery and is now requesting pharmacotherapy with GLP 1 agonist because his father has done well with the medication.  Thus far he has been unable to  to restrict his appetite or maintain an exercise program.  His prior bariatric surgery (biliopancreatic duodenal switch  in May 2020) may be a contraindication to use of GLP 1 agonist use  if it predisposes him to pancreatitis .  I have advised to discuss with his bariatric surgeon

## 2023-10-13 MED ORDER — TRAZODONE HCL 100 MG PO TABS: 100.0000 mg | ORAL_TABLET | Freq: Every evening | ORAL | 1 refills | Status: DC | PRN

## 2023-11-09 ENCOUNTER — Other Ambulatory Visit: Payer: Self-pay | Admitting: Internal Medicine

## 2023-11-10 ENCOUNTER — Other Ambulatory Visit: Payer: Medicaid Other

## 2023-11-10 DIAGNOSIS — H5213 Myopia, bilateral: Secondary | ICD-10-CM | POA: Diagnosis not present

## 2023-11-11 DIAGNOSIS — H5203 Hypermetropia, bilateral: Secondary | ICD-10-CM | POA: Diagnosis not present

## 2023-11-15 ENCOUNTER — Other Ambulatory Visit: Payer: Self-pay | Admitting: Internal Medicine

## 2023-12-07 ENCOUNTER — Other Ambulatory Visit: Payer: Self-pay | Admitting: Internal Medicine

## 2023-12-13 ENCOUNTER — Encounter: Payer: Self-pay | Admitting: Internal Medicine

## 2023-12-13 DIAGNOSIS — J4520 Mild intermittent asthma, uncomplicated: Secondary | ICD-10-CM

## 2023-12-15 ENCOUNTER — Other Ambulatory Visit (HOSPITAL_COMMUNITY): Payer: Self-pay

## 2023-12-15 ENCOUNTER — Encounter: Payer: Self-pay | Admitting: Cardiology

## 2023-12-15 ENCOUNTER — Other Ambulatory Visit: Payer: Self-pay

## 2023-12-15 ENCOUNTER — Telehealth: Payer: Self-pay

## 2023-12-15 MED ORDER — BUPROPION HCL ER (XL) 300 MG PO TB24: 300.0000 mg | ORAL_TABLET | Freq: Every day | ORAL | 1 refills | Status: DC

## 2023-12-15 MED ORDER — TRAZODONE HCL 100 MG PO TABS: 100.0000 mg | ORAL_TABLET | Freq: Every evening | ORAL | 1 refills | Status: DC | PRN

## 2023-12-15 MED ORDER — LOSARTAN POTASSIUM 50 MG PO TABS: ORAL_TABLET | ORAL | 1 refills | Status: DC

## 2023-12-15 MED ORDER — APIXABAN 5 MG PO TABS: 5.0000 mg | ORAL_TABLET | Freq: Two times a day (BID) | ORAL | 3 refills | Status: DC

## 2023-12-15 MED ORDER — METOPROLOL TARTRATE 100 MG PO TABS: 100.0000 mg | ORAL_TABLET | Freq: Two times a day (BID) | ORAL | 1 refills | Status: DC

## 2023-12-15 MED ORDER — PAROXETINE HCL ER 25 MG PO TB24: ORAL_TABLET | ORAL | 1 refills | Status: DC

## 2023-12-15 MED ORDER — MONTELUKAST SODIUM 10 MG PO TABS
10.0000 mg | ORAL_TABLET | Freq: Every day | ORAL | 1 refills | Status: DC
Start: 2023-12-15 — End: 2024-04-28

## 2023-12-15 NOTE — Telephone Encounter (Signed)
 Pharmacy Patient Advocate Encounter   Received notification from CoverMyMeds that prior authorization for PARoxetine HCl ER 25MG  er tablets is required/requested.   Insurance verification completed.   The patient is insured through [facility] .   Per test claim: PA required and submitted KEY/EOC/Request #: [id number] CANCELLED due to not needed.

## 2023-12-15 NOTE — Telephone Encounter (Signed)
 Prescription refill request for Eliquis received. Indication:AFIB Last office visit:6/24 Scr:0.89  3/24 Age: 28 Weight:181.9  kg  Prescription refilled

## 2024-01-08 ENCOUNTER — Ambulatory Visit
Admission: RE | Admit: 2024-01-08 | Discharge: 2024-01-08 | Disposition: A | Discharge status: Home / Self Care | Source: Ambulatory Visit | Attending: Family Medicine | Admitting: Family Medicine

## 2024-01-08 VITALS — BP 124/79 | HR 80 | Temp 98.1°F | Resp 18 | Ht 75.0 in | Wt >= 6400 oz

## 2024-01-08 DIAGNOSIS — R300 Dysuria: Secondary | ICD-10-CM

## 2024-01-08 DIAGNOSIS — R319 Hematuria, unspecified: Secondary | ICD-10-CM

## 2024-01-08 LAB — POCT URINALYSIS DIP (MANUAL ENTRY)
Bilirubin, UA: NEGATIVE
Glucose, UA: NEGATIVE mg/dL
Ketones, POC UA: NEGATIVE mg/dL
Leukocytes, UA: NEGATIVE
Nitrite, UA: NEGATIVE
Protein Ur, POC: NEGATIVE mg/dL
Spec Grav, UA: 1.01 (ref 1.010–1.025)
Urobilinogen, UA: 1 U/dL
pH, UA: 6 (ref 5.0–8.0)

## 2024-01-08 MED ORDER — SULFAMETHOXAZOLE-TRIMETHOPRIM 800-160 MG PO TABS
1.0000 | ORAL_TABLET | Freq: Two times a day (BID) | ORAL | 0 refills | Status: AC
Start: 2024-01-08 — End: 2024-01-15

## 2024-01-08 NOTE — Discharge Instructions (Addendum)
 Advised patient to take medication as directed with food to completion.  Encouraged increase daily water intake to 64 ounces per day while taking this medication.  Advised patient if symptoms worsen and/or unresolved please follow-up with [facility] or go to [facility] ED for further evaluation to include CT of the renal pelvis to exclude possible kidney stones.

## 2024-01-08 NOTE — ED Provider Notes (Signed)
 [name]    CSN: [id number] Arrival date & time: 01/08/24  1846      History   Chief Complaint Chief Complaint  Patient presents with   Urinary Frequency    Pain while urinating. Slightly elevated temperature - Entered by patient    HPI Larry Rogers is a 28 y.o. male.   HPI 28 year old male presents with dysuria, urinary frequency and back pain.  PMH significant for bilateral lower atrial fibrillation, extremity edema, and morbid obesity.  Patient is currently on apixaban  and denies any unusual bleeding.  Past Medical History:  Diagnosis Date   Acne    [place] Skin   Anxiety    Anxiety    Asthma    seasonal allergies, Dx age 82   Depression    Fatty liver    GERD (gastroesophageal reflux disease)    Headache    Hypertension 2012   Dr. Ladon Pickler    Patient Active Problem List   Diagnosis Date Noted   Achilles tendon disorder, right 07/15/2022   Allergic rhinitis 12/18/2021   S/P bariatric surgery 08/08/2019   Bilateral lower extremity edema 12/17/2016   Hepatic steatosis 12/17/2016   Snoring 07/04/2015   Atrial fibrillation (HCC) 06/14/2015   Thyroid  dysfunction 06/14/2015   Depression with anxiety 02/14/2015   Seasonal allergies 12/20/2014   Gastroesophageal reflux disease without esophagitis 08/15/2014   Essential hypertension, benign 02/25/2013   Morbid obesity (HCC) 02/25/2013    Past Surgical History:  Procedure Laterality Date   ADENOIDECTOMY     BILIOPANCREATIC DIVISION W DUODENAL SWITCH  01/2019   ESOPHAGOGASTRODUODENOSCOPY (EGD) WITH PROPOFOL  N/A 03/16/2015   Procedure: ESOPHAGOGASTRODUODENOSCOPY (EGD) WITH PROPOFOL ;  Surgeon: Claudette Cue, MD;  Location: [facility];  Service: Endoscopy;  Laterality: N/A;   TONSILLECTOMY         Home Medications    Prior to Admission medications   Medication Sig Start Date End Date Taking? Authorizing Provider  apixaban  (ELIQUIS ) 5 MG TABS tablet Take 1 tablet (5 mg total) by mouth  2 (two) times daily. 12/15/23  Yes Boyce Byes, MD  buPROPion  (WELLBUTRIN  XL) 300 MG 24 hr tablet Take 1 tablet (300 mg total) by mouth daily. 12/15/23  Yes Tullo, Teresa L, MD  Cholecalciferol (VITAMIN D3) 5000 units CAPS Take 1 capsule by mouth daily.   Yes [provider]  losartan  (COZAAR ) 50 MG tablet TAKE 1 TABLET(50 MG) BY MOUTH AT BEDTIME 12/15/23  Yes Thersia Flax, MD  metoprolol  tartrate (LOPRESSOR ) 100 MG tablet Take 1 tablet (100 mg total) by mouth 2 (two) times daily. 12/15/23  Yes Thersia Flax, MD  montelukast  (SINGULAIR ) 10 MG tablet Take 1 tablet (10 mg total) by mouth at bedtime. 12/15/23  Yes Thersia Flax, MD  PARoxetine  (PAXIL -CR) 25 MG 24 hr tablet TAKE 1 TABLET(25 MG) BY MOUTH DAILY 12/15/23  Yes Tullo, Teresa L, MD  sulfamethoxazole -trimethoprim  (BACTRIM  DS) 800-160 MG tablet Take 1 tablet by mouth 2 (two) times daily for 7 days. 01/08/24 01/15/24 Yes Leonides Ramp, FNP  traZODone  (DESYREL ) 100 MG tablet Take 1 tablet (100 mg total) by mouth at bedtime as needed for sleep. 12/15/23  Yes Thersia Flax, MD  triamcinolone  (KENALOG ) 0.1 % paste Advised patient may apply to affected areas of mouth twice daily for 5 days 04/11/23  Yes Leonides Ramp, FNP    Family History Family History  Problem Relation Age of Onset   Hypertension Father    Heart disease Paternal Grandfather  Hypertension Paternal Grandfather    Aortic aneurysm Paternal Grandfather    Diabetes Mother    Diabetes Maternal Uncle        x2   Colon cancer Neg Hx    Colon polyps Neg Hx    Esophageal cancer Neg Hx    Kidney disease Neg Hx    Gallbladder disease Neg Hx     Social History Social History   Tobacco Use   Smoking status: Never   Smokeless tobacco: Never  Vaping Use   Vaping status: Never Used  Substance Use Topics   Alcohol use: No    Alcohol/week: 0.0 standard drinks of alcohol   Drug use: No     Allergies   Patient has no known allergies.   Review of  Systems Review of Systems   Physical Exam Triage Vital Signs ED Triage Vitals  Encounter Vitals Group     BP 01/08/24 1856 124/79     Systolic BP Percentile --      Diastolic BP Percentile --      Pulse Rate 01/08/24 1856 80     Resp 01/08/24 1856 18     Temp 01/08/24 1856 98.1 F (36.7 C)     Temp Source 01/08/24 1856 Oral     SpO2 01/08/24 1856 93 %     Weight 01/08/24 1858 (!) 410 lb (186 kg)     Height 01/08/24 1858 6\' 3"  (1.905 m)     Head Circumference --      Peak Flow --      Pain Score 01/08/24 1858 8     Pain Loc --      Pain Education --      Exclude from Growth Chart --    No data found.  Updated Vital Signs BP 124/79 (BP Location: Right Arm)   Pulse 80   Temp 98.1 F (36.7 C) (Oral)   Resp 18   Ht 6\' 3"  (1.905 m)   Wt (!) 410 lb (186 kg)   SpO2 93%   BMI 51.25 kg/m   Visual Acuity Right Eye Distance:   Left Eye Distance:   Bilateral Distance:    Right Eye Near:   Left Eye Near:    Bilateral Near:     Physical Exam Vitals and nursing note reviewed.  Constitutional:      Appearance: Normal appearance. He is obese.  HENT:     Mouth/Throat:     Mouth: Mucous membranes are moist.     Pharynx: Oropharynx is clear.  Eyes:     Extraocular Movements: Extraocular movements intact.     Conjunctiva/sclera: Conjunctivae normal.     Pupils: Pupils are equal, round, and reactive to light.  Cardiovascular:     Rate and Rhythm: Normal rate and regular rhythm.     Pulses: Normal pulses.     Heart sounds: Normal heart sounds.  Pulmonary:     Effort: Pulmonary effort is normal.     Breath sounds: Normal breath sounds. No wheezing, rhonchi or rales.  Chest:     Chest wall: No tenderness.  Musculoskeletal:        General: Normal range of motion.     Cervical back: Normal range of motion and neck supple.  Skin:    General: Skin is warm and dry.  Neurological:     General: No focal deficit present.     Mental Status: He is alert and oriented to  person, place, and time. Mental status is at baseline.  Psychiatric:        Mood and Affect: Mood normal.        Behavior: Behavior normal.        Thought Content: Thought content normal.      UC Treatments / Results  Labs (all labs ordered are listed, but only abnormal results are displayed) Labs Reviewed  POCT URINALYSIS DIP (MANUAL ENTRY) - Abnormal; Notable for the following components:      Result Value   Blood, UA moderate (*)    All other components within normal limits    EKG   Radiology No results found.  Procedures Procedures (including critical care time)  Medications Ordered in UC Medications - No data to display  Initial Impression / Assessment and Plan / UC Course  I have reviewed the triage vital signs and the nursing notes.  Pertinent labs & imaging results that were available during my care of the patient were reviewed by me and considered in my medical decision making (see chart for details).     MDM: 1.  Hematuria, unspecified type-UA revealed above. Advised patient if symptoms worsen and/or unresolved please go to [facility] ED for further evaluation to include CT of the renal pelvis to exclude possible kidney stones.  Patient discharged home, hemodynamically stable.  2.  Dysuria-Rx'd Bactrim  DS 800/160 mg tablet: Take 1 tablet twice daily x 7 days-UA revealed above, will treat empirically for mild prostatitis with Bactrim . Advised patient to take medication as directed with food to completion.  Encouraged increase daily water intake to 64 ounces per day while taking this medication.  Advised patient if symptoms worsen and/or unresolved please follow-up with [facility] or go to [facility] ED for further evaluation to include CT of the renal pelvis to exclude possible kidney stones.  Patient discharged home, hemodynamically stable. Final Clinical Impressions(s) / UC Diagnoses   Final diagnoses:   Hematuria, unspecified type  Dysuria     Discharge Instructions      Advised patient to take medication as directed with food to completion.  Encouraged increase daily water intake to 64 ounces per day while taking this medication.  Advised patient if symptoms worsen and/or unresolved please follow-up with [facility] or go to [facility] ED for further evaluation to include CT of the renal pelvis to exclude possible kidney stones.     ED Prescriptions     Medication Sig Dispense Auth. Provider   sulfamethoxazole -trimethoprim  (BACTRIM  DS) 800-160 MG tablet Take 1 tablet by mouth 2 (two) times daily for 7 days. 14 tablet Rolan Wrightsman, FNP      PDMP not reviewed this encounter.   Leonides Ramp, FNP 01/08/24 1927

## 2024-01-08 NOTE — ED Triage Notes (Signed)
 Patient c/o dysuria, urinary frequency and low back pain.  Denies any hematuria.  Low grade fever.  Patient has taken OTC Cranberry tablets.

## 2024-01-29 ENCOUNTER — Ambulatory Visit

## 2024-02-03 DIAGNOSIS — M47816 Spondylosis without myelopathy or radiculopathy, lumbar region: Secondary | ICD-10-CM | POA: Diagnosis not present

## 2024-02-03 DIAGNOSIS — M545 Low back pain, unspecified: Secondary | ICD-10-CM | POA: Diagnosis not present

## 2024-04-23 ENCOUNTER — Ambulatory Visit (INDEPENDENT_AMBULATORY_CARE_PROVIDER_SITE_OTHER): Admitting: Podiatry

## 2024-04-23 ENCOUNTER — Encounter: Payer: Self-pay | Admitting: Podiatry

## 2024-04-23 DIAGNOSIS — M7661 Achilles tendinitis, right leg: Secondary | ICD-10-CM

## 2024-04-23 DIAGNOSIS — M7662 Achilles tendinitis, left leg: Secondary | ICD-10-CM | POA: Diagnosis not present

## 2024-04-23 MED ORDER — METHYLPREDNISOLONE 4 MG PO TBPK
ORAL_TABLET | ORAL | 0 refills | Status: DC
Start: 2024-04-23 — End: 2024-05-31

## 2024-04-23 NOTE — Patient Instructions (Signed)

## 2024-04-23 NOTE — Progress Notes (Signed)
  Subjective:  Patient ID: Larry Rogers, male    DOB: [birth year],   MRN: [id number]  Chief Complaint  Patient presents with   Foot Pain    I have Plantar Fasciitis and heel spurs.  I've gotten Cortisone shots before but it's been a while. (Bilateral - posterior heel)    28 y.o. male presents for concern of reoccurrence of heel pain mostly on the back of both of his heels. Has seen Dr. Janit in the past and had injections for plantar fasciitis and into achilles area.  Relates pain kind of improved with steroids but continues to reoccur. He is going on a cruise in about a month and hoping to get some relief. . Denies any other pedal complaints. Denies n/v/f/c.   Past Medical History:  Diagnosis Date   Acne    [place] Skin   Anxiety    Anxiety    Asthma    seasonal allergies, Dx age 3   Depression    Fatty liver    GERD (gastroesophageal reflux disease)    Headache    Hypertension 2012   Dr. Pennelope    Objective:  Physical Exam: Vascular: DP/PT pulses 2/4 bilateral. CFT <3 seconds. Normal hair growth on digits. No edema.  Skin. No lacerations or abrasions bilateral feet.  Musculoskeletal: MMT 5/5 bilateral lower extremities in DF, PF, Inversion and Eversion. Deceased ROM in DF of ankle joint. Tender to posterior heel bilateral in area of achilles insertion with prominent spurring noted. Some pain with DF of the achilles and noted equinus.  Neurological: Sensation intact to light touch.   Assessment:   1. Achilles tendonitis, bilateral      Plan:  Patient was evaluated and treated and all questions answered. -Xrays reviewed from previous with prominent spurring noted to posterior calcaneus bilateral . -Discussed Achilles insertional tendonitis and treatment options with patient.  -Discussed stretching exercises. Amb ref to PT sent.  -Rx Medrol  dose pack provided  -Discussed if no improvement will consider MRI/PT/EPAT/PRP injections.  -Patient to return to office in 2  months for recheck.    Asberry Failing, DPM

## 2024-04-28 ENCOUNTER — Encounter: Payer: Self-pay | Admitting: Internal Medicine

## 2024-04-28 DIAGNOSIS — J4520 Mild intermittent asthma, uncomplicated: Secondary | ICD-10-CM

## 2024-04-28 MED ORDER — BUPROPION HCL ER (XL) 300 MG PO TB24: 300.0000 mg | ORAL_TABLET | Freq: Every day | ORAL | 0 refills | Status: DC

## 2024-04-28 MED ORDER — METOPROLOL TARTRATE 100 MG PO TABS: 100.0000 mg | ORAL_TABLET | Freq: Two times a day (BID) | ORAL | 0 refills | Status: DC

## 2024-04-28 MED ORDER — PAROXETINE HCL ER 25 MG PO TB24: ORAL_TABLET | ORAL | 0 refills | Status: DC

## 2024-04-28 MED ORDER — MONTELUKAST SODIUM 10 MG PO TABS: 10.0000 mg | ORAL_TABLET | Freq: Every day | ORAL | 0 refills | Status: AC

## 2024-04-28 MED ORDER — TRAZODONE HCL 100 MG PO TABS: 100.0000 mg | ORAL_TABLET | Freq: Every evening | ORAL | 0 refills | Status: DC | PRN

## 2024-04-28 MED ORDER — LOSARTAN POTASSIUM 50 MG PO TABS: ORAL_TABLET | ORAL | 0 refills | Status: DC

## 2024-04-29 ENCOUNTER — Telehealth: Payer: Self-pay | Admitting: Cardiology

## 2024-04-29 ENCOUNTER — Other Ambulatory Visit: Payer: Self-pay | Admitting: Nurse Practitioner

## 2024-04-29 ENCOUNTER — Other Ambulatory Visit: Payer: Self-pay | Admitting: Internal Medicine

## 2024-04-29 DIAGNOSIS — I480 Paroxysmal atrial fibrillation: Secondary | ICD-10-CM

## 2024-04-29 NOTE — Telephone Encounter (Signed)
 Pt c/o medication issue:  1. Name of Medication:   apixaban  (ELIQUIS ) 5 MG TABS tablet   2. How are you currently taking this medication (dosage and times per day)?   As prescribed  3. Are you having a reaction (difficulty breathing--STAT)?   No  4. What is your medication issue?   Patient stated he was returned a staff call regarding refilling this medication.

## 2024-04-29 NOTE — Telephone Encounter (Signed)
 Refill Request.

## 2024-04-29 NOTE — Telephone Encounter (Signed)
 Called pt and LMOM.

## 2024-04-29 NOTE — Telephone Encounter (Signed)
 Prescription refill request for Eliquis  received. Indication: afib  Last office visit: Cindie 03/05/2023 Scr: 0.89 12/15/2022 Age: 28 yo  Weight: 186 kg    Overdue for blood work and office visit with cardiologist. Message sent to schedulers.

## 2024-04-30 NOTE — Telephone Encounter (Signed)
 Called pt and LMOM.

## 2024-04-30 NOTE — Telephone Encounter (Signed)
 Please see refill encounter from 04/29/2024 for further documentation.

## 2024-04-30 NOTE — Telephone Encounter (Signed)
 Called and spoke to pt informed him that he will need to see the cardiologist and will need to get blood work to continue getting refills of Eliquis . Transferred pt to the mainline to make an appointment. Put in orders for a cbc and bmet.

## 2024-05-03 ENCOUNTER — Other Ambulatory Visit: Payer: Self-pay | Admitting: Internal Medicine

## 2024-05-19 ENCOUNTER — Other Ambulatory Visit: Payer: Self-pay

## 2024-05-19 ENCOUNTER — Telehealth: Payer: Self-pay | Admitting: Cardiovascular Disease

## 2024-05-19 MED ORDER — APIXABAN 5 MG PO TABS: 5.0000 mg | ORAL_TABLET | Freq: Two times a day (BID) | ORAL | 0 refills | Status: AC

## 2024-05-19 NOTE — Telephone Encounter (Signed)
 Prescription refill request for Eliquis  received. Indication:afib Last office visit:upcoming Drm:wzzid labs Age: 28 Weight:186  kg  Prescription refilled

## 2024-05-19 NOTE — Telephone Encounter (Signed)
*  STAT* If patient is at the pharmacy, call can be transferred to refill team.   1. Which medications need to be refilled? (please list name of each medication and dose if known) apixaban  (ELIQUIS ) 5 MG TABS tablet [[id number]]    2. Would you like to learn more about the convenience, safety, & potential cost savings by using the [facility]? Na    3. Are you open to using the [facility] (Type [facility].NA    4. Which pharmacy/location (including street and city if local pharmacy) is medication to be sent to?[facility] in [place]    5. Do they need a 30 day or 90 day supply? 90

## 2024-05-24 ENCOUNTER — Telehealth: Payer: Self-pay

## 2024-05-24 ENCOUNTER — Other Ambulatory Visit (HOSPITAL_COMMUNITY): Payer: Self-pay

## 2024-05-24 ENCOUNTER — Encounter: Payer: Self-pay | Admitting: Internal Medicine

## 2024-05-24 NOTE — Telephone Encounter (Signed)
 Pharmacy Patient Advocate Encounter  Received notification from [employer] that Prior Authorization for PARoxetine  HCl ER 25MG  er tablets  has been APPROVED from 05/24/24 to 05/24/25. Ran test claim, Copay is $4. This test claim was processed through [facility]- copay amounts may vary at other pharmacies due to pharmacy/plan contracts, or as the patient moves through the different stages of their insurance plan.   PA #/Case ID/Reference #: [id number]

## 2024-05-24 NOTE — Telephone Encounter (Signed)
 Pharmacy Patient Advocate Encounter   Received notification from Pt Calls Messages that prior authorization for PARoxetine  HCl ER 25MG  er tablets  is required/requested.   Insurance verification completed.   The patient is insured through [facility] .   Per test claim: PA required; PA submitted to above mentioned insurance via Latent Key/confirmation #/EOC [facility] Status is pending

## 2024-05-24 NOTE — Telephone Encounter (Signed)
 PA request has been Submitted. New Encounter has been or will be created for follow up. For additional info see Pharmacy Prior Auth telephone encounter from 05/24/24.

## 2024-05-24 NOTE — Telephone Encounter (Signed)
 PA needed for PARoxetine  (PAXIL -CR) 25 MG 24 hr tablet

## 2024-05-24 NOTE — Telephone Encounter (Signed)
 noted

## 2024-05-31 ENCOUNTER — Other Ambulatory Visit: Payer: Self-pay | Admitting: Podiatry

## 2024-05-31 ENCOUNTER — Encounter: Payer: Self-pay | Admitting: Podiatry

## 2024-05-31 MED ORDER — METHYLPREDNISOLONE 4 MG PO TBPK: ORAL_TABLET | ORAL | 0 refills | Status: AC

## 2024-06-03 ENCOUNTER — Ambulatory Visit: Attending: Cardiology | Admitting: Cardiology

## 2024-06-03 NOTE — Progress Notes (Signed)
 This encounter was created in error - please disregard.

## 2024-06-15 ENCOUNTER — Ambulatory Visit: Admitting: Internal Medicine

## 2024-06-24 ENCOUNTER — Ambulatory Visit: Admitting: Podiatry

## 2024-07-08 ENCOUNTER — Other Ambulatory Visit: Payer: Self-pay | Admitting: Internal Medicine

## 2024-07-09 ENCOUNTER — Other Ambulatory Visit: Payer: Self-pay | Admitting: Internal Medicine

## 2024-08-28 ENCOUNTER — Other Ambulatory Visit: Payer: Self-pay | Admitting: Internal Medicine

## 2024-09-01 ENCOUNTER — Other Ambulatory Visit: Payer: Self-pay | Admitting: Internal Medicine
